# Patient Record
Sex: Female | Born: 1945
Health system: Southern US, Community
[De-identification: ages and names within clinical notes are randomized; demographics above are authoritative.]

## PROBLEM LIST (undated history)

## (undated) DIAGNOSIS — G8929 Other chronic pain: Secondary | ICD-10-CM

## (undated) DIAGNOSIS — E785 Hyperlipidemia, unspecified: Secondary | ICD-10-CM

## (undated) DIAGNOSIS — M1990 Unspecified osteoarthritis, unspecified site: Secondary | ICD-10-CM

## (undated) DIAGNOSIS — Z87820 Personal history of traumatic brain injury: Secondary | ICD-10-CM

## (undated) DIAGNOSIS — E119 Type 2 diabetes mellitus without complications: Secondary | ICD-10-CM

## (undated) DIAGNOSIS — C801 Malignant (primary) neoplasm, unspecified: Secondary | ICD-10-CM

## (undated) DIAGNOSIS — R32 Unspecified urinary incontinence: Secondary | ICD-10-CM

## (undated) DIAGNOSIS — M4800 Spinal stenosis, site unspecified: Secondary | ICD-10-CM

## (undated) DIAGNOSIS — Z923 Personal history of irradiation: Secondary | ICD-10-CM

## (undated) HISTORY — DX: Malignant (primary) neoplasm, unspecified: C80.1

## (undated) HISTORY — PX: ABDOMINAL HYSTERECTOMY: SHX81

## (undated) HISTORY — PX: TONSILLECTOMY: SUR1361

## (undated) HISTORY — PX: COLONOSCOPY: SHX174

---

## 1997-11-06 HISTORY — PX: BREAST LUMPECTOMY: SHX2

## 2006-11-09 ENCOUNTER — Ambulatory Visit: Payer: Self-pay | Admitting: Gastroenterology

## 2010-11-06 DIAGNOSIS — Z87820 Personal history of traumatic brain injury: Secondary | ICD-10-CM

## 2010-11-06 HISTORY — DX: Personal history of traumatic brain injury: Z87.820

## 2010-11-06 HISTORY — PX: SHOULDER ARTHROSCOPY: SHX128

## 2010-12-12 ENCOUNTER — Ambulatory Visit: Payer: Self-pay | Admitting: Internal Medicine

## 2012-08-23 ENCOUNTER — Other Ambulatory Visit: Payer: Self-pay | Admitting: Family Medicine

## 2012-08-23 DIAGNOSIS — S0990XA Unspecified injury of head, initial encounter: Secondary | ICD-10-CM

## 2012-08-26 ENCOUNTER — Other Ambulatory Visit: Payer: Self-pay

## 2014-05-07 DIAGNOSIS — E785 Hyperlipidemia, unspecified: Secondary | ICD-10-CM | POA: Insufficient documentation

## 2014-05-07 DIAGNOSIS — E559 Vitamin D deficiency, unspecified: Secondary | ICD-10-CM | POA: Insufficient documentation

## 2014-05-07 DIAGNOSIS — E1169 Type 2 diabetes mellitus with other specified complication: Secondary | ICD-10-CM | POA: Insufficient documentation

## 2014-11-06 HISTORY — PX: BREAST LUMPECTOMY: SHX2

## 2015-01-15 ENCOUNTER — Ambulatory Visit: Payer: Self-pay | Admitting: Physician Assistant

## 2015-01-18 ENCOUNTER — Ambulatory Visit: Payer: Self-pay | Admitting: Physician Assistant

## 2015-01-21 ENCOUNTER — Ambulatory Visit
Admit: 2015-01-21 | Disposition: A | Discharge status: Home / Self Care | Payer: Self-pay | Attending: Oncology | Admitting: Oncology

## 2015-01-25 ENCOUNTER — Telehealth: Payer: Self-pay | Admitting: *Deleted

## 2015-01-25 NOTE — Telephone Encounter (Signed)
Received appt from Dr. Jana Hakim.  Called pt and confirmed 02/01/15 FA and lab appt and 4/1 med onc appt w/ pt.  Mailed before appt letter, calendar, welcoming packet & intake form to pt.   Placed paperwork in Dr. Virgie Dad box and took a copy to HIM to scan.

## 2015-01-26 ENCOUNTER — Ambulatory Visit
Admit: 2015-01-26 | Disposition: A | Discharge status: Home / Self Care | Payer: Self-pay | Attending: Oncology | Admitting: Oncology

## 2015-01-28 ENCOUNTER — Other Ambulatory Visit (INDEPENDENT_AMBULATORY_CARE_PROVIDER_SITE_OTHER): Payer: Self-pay

## 2015-01-28 ENCOUNTER — Other Ambulatory Visit: Payer: Self-pay | Admitting: General Surgery

## 2015-01-28 ENCOUNTER — Other Ambulatory Visit: Payer: Self-pay | Admitting: *Deleted

## 2015-01-28 DIAGNOSIS — C50919 Malignant neoplasm of unspecified site of unspecified female breast: Secondary | ICD-10-CM

## 2015-01-28 DIAGNOSIS — C50912 Malignant neoplasm of unspecified site of left female breast: Secondary | ICD-10-CM | POA: Insufficient documentation

## 2015-02-01 ENCOUNTER — Encounter (HOSPITAL_COMMUNITY): Payer: Self-pay

## 2015-02-01 ENCOUNTER — Ambulatory Visit: Payer: Medicare Other

## 2015-02-01 ENCOUNTER — Encounter: Payer: Self-pay | Admitting: Oncology

## 2015-02-01 ENCOUNTER — Other Ambulatory Visit: Payer: Self-pay | Admitting: General Surgery

## 2015-02-01 ENCOUNTER — Other Ambulatory Visit: Payer: Self-pay | Admitting: *Deleted

## 2015-02-01 ENCOUNTER — Other Ambulatory Visit (HOSPITAL_BASED_OUTPATIENT_CLINIC_OR_DEPARTMENT_OTHER): Payer: Medicare Other

## 2015-02-01 DIAGNOSIS — C50919 Malignant neoplasm of unspecified site of unspecified female breast: Secondary | ICD-10-CM

## 2015-02-01 DIAGNOSIS — C50812 Malignant neoplasm of overlapping sites of left female breast: Secondary | ICD-10-CM

## 2015-02-01 DIAGNOSIS — C50912 Malignant neoplasm of unspecified site of left female breast: Secondary | ICD-10-CM

## 2015-02-01 LAB — COMPREHENSIVE METABOLIC PANEL (CC13)
ALK PHOS: 27 U/L — AB (ref 40–150)
ALT: 29 U/L (ref 0–55)
AST: 24 U/L (ref 5–34)
Albumin: 3.8 g/dL (ref 3.5–5.0)
Anion Gap: 11 mEq/L (ref 3–11)
BUN: 18.7 mg/dL (ref 7.0–26.0)
CALCIUM: 8.9 mg/dL (ref 8.4–10.4)
CO2: 27 mEq/L (ref 22–29)
CREATININE: 0.9 mg/dL (ref 0.6–1.1)
Chloride: 105 mEq/L (ref 98–109)
EGFR: 70 mL/min/{1.73_m2} — AB (ref >90)
Glucose: 119 mg/dl (ref 70–140)
Potassium: 4.1 mEq/L (ref 3.5–5.1)
Sodium: 142 mEq/L (ref 136–145)
Total Bilirubin: 0.5 mg/dL (ref 0.20–1.20)
Total Protein: 6.5 g/dL (ref 6.4–8.3)

## 2015-02-01 LAB — CBC WITH DIFFERENTIAL/PLATELET
BASO%: 0.6 % (ref 0.0–2.0)
Basophils Absolute: 0 10*3/uL (ref 0.0–0.1)
EOS ABS: 0.1 10*3/uL (ref 0.0–0.5)
EOS%: 2 % (ref 0.0–7.0)
HCT: 40.3 % (ref 34.8–46.6)
HGB: 13.3 g/dL (ref 11.6–15.9)
LYMPH%: 22.6 % (ref 14.0–49.7)
MCH: 29.2 pg (ref 25.1–34.0)
MCHC: 33 g/dL (ref 31.5–36.0)
MCV: 88.6 fL (ref 79.5–101.0)
MONO#: 0.4 10*3/uL (ref 0.1–0.9)
MONO%: 7.6 % (ref 0.0–14.0)
NEUT%: 67.2 % (ref 38.4–76.8)
NEUTROS ABS: 3.4 10*3/uL (ref 1.5–6.5)
PLATELETS: 186 10*3/uL (ref 145–400)
RBC: 4.55 10*6/uL (ref 3.70–5.45)
RDW: 13 % (ref 11.2–14.5)
WBC: 5 10*3/uL (ref 3.9–10.3)
lymph#: 1.1 10*3/uL (ref 0.9–3.3)

## 2015-02-01 NOTE — Progress Notes (Signed)
Checked in new pt with no financial concerns at this time.  Pt has my card for any billing questions or concerns. °

## 2015-02-02 ENCOUNTER — Other Ambulatory Visit: Payer: Medicare Other

## 2015-02-02 ENCOUNTER — Ambulatory Visit
Admission: RE | Admit: 2015-02-02 | Discharge: 2015-02-02 | Disposition: A | Discharge status: Home / Self Care | Payer: Medicare Other | Source: Ambulatory Visit | Attending: General Surgery | Admitting: General Surgery

## 2015-02-02 ENCOUNTER — Other Ambulatory Visit: Payer: Self-pay | Admitting: Oncology

## 2015-02-02 DIAGNOSIS — C50912 Malignant neoplasm of unspecified site of left female breast: Secondary | ICD-10-CM

## 2015-02-03 ENCOUNTER — Ambulatory Visit
Admission: RE | Admit: 2015-02-03 | Discharge: 2015-02-03 | Disposition: A | Discharge status: Home / Self Care | Payer: Medicare Other | Source: Ambulatory Visit | Attending: General Surgery | Admitting: General Surgery

## 2015-02-03 MED ORDER — GADOBENATE DIMEGLUMINE 529 MG/ML IV SOLN
17.0000 mL | Freq: Once | INTRAVENOUS | Status: AC | PRN
  Administered 2015-02-03: 17 mL via INTRAVENOUS

## 2015-02-04 ENCOUNTER — Other Ambulatory Visit: Payer: Self-pay | Admitting: General Surgery

## 2015-02-04 ENCOUNTER — Ambulatory Visit
Admission: RE | Admit: 2015-02-04 | Discharge: 2015-02-04 | Disposition: A | Discharge status: Home / Self Care | Payer: Medicare Other | Source: Ambulatory Visit | Attending: General Surgery | Admitting: General Surgery

## 2015-02-04 ENCOUNTER — Encounter (HOSPITAL_BASED_OUTPATIENT_CLINIC_OR_DEPARTMENT_OTHER): Payer: Self-pay | Admitting: *Deleted

## 2015-02-04 DIAGNOSIS — C50919 Malignant neoplasm of unspecified site of unspecified female breast: Secondary | ICD-10-CM

## 2015-02-04 NOTE — Progress Notes (Signed)
[place]  Telephone:([phone]) [phone] Fax:(336) [phone]     ID: Gabriela [name] DOB: Jul 30, 1946  MR#: [id number]  UKG#:[id number]  Patient Care Team: Glendon Axe, MD as PCP - General (Internal Medicine) Excell Seltzer, MD as Consulting Physician (General Surgery) PCP: Glendon Axe, MD GYN: SU: Excell Seltzer M.D. OTHER MD: Prentiss Bells "Gabriela" [name], [place]; Berton Mount MD  CHIEF COMPLAINT: Estrogen receptor positive breast cancer  CURRENT TREATMENT: Preoperative evaluation   BREAST CANCER HISTORY: The patient herself noted a lump in her left breast, associated with some discomfort. She brought it to her physician's attention and on 01/15/2015 underwent left digital mammography with tomography and ultrasonography at [facility] regional. This showed breast density to be category A. Compression images were negative but on physical exam there was a firm palpable mobile nodule at the 3:00 position of the left breast. By ultrasound this measured 9 mm maximally. There is no note regarding axillary adenopathy in the mammographic report.  On 01/18/2015 the patient underwent biopsy of the mass in question and this showed ([id number]; [id number]) an invasive mammary carcinoma, measuring 3 mm on the specimen, grade 1, estrogen receptor greater than 90% positive, progesterone receptor between 11 and 50% positive, with no HER-2 amplification  (Immunohistochemistry score 1+).  On 02/03/2015 the patient underwent bilateral breast MRI. This suggested a breast density category B. The right breast was negative and there were no abnormal appearing lymph nodes. In the left breast there was an irregular enhancing mass subareolar early measuring 9 mm.  Her subsequent history is as detailed below  INTERVAL HISTORY: Please was evaluated in the breast clinic for 11/25/2014 accompanied by her husband Gabriela [name] and her daughter's Gabriela [name] and Gabriela [name]. Her case was also presented at the  multi-disciplinary breast cancer conference 02/03/2015. At that time a preliminary plan suggested breast conserving surgery, with nipple sparing if possible, Oncotype DX to clinch the chemotherapy decision,, radiation, and anti-estrogens following radiation.  REVIEW OF SYSTEMS: Aside from the palpable mass and some intermittent left breast discomfort,, there were no specific symptoms leading to the original mammogram, which was routinely scheduled. The patient denies unusual headaches, visual changes, nausea, vomiting, stiff neck, dizziness, or gait imbalance. There has been no cough, phlegm production, or pleurisy, no chest pain or pressure, and no change in bowel or bladder habits. The patient denies fever, rash, bleeding, unexplained fatigue or unexplained weight loss. Gabriela [name] is having some seasonal sinus symptoms. A detailed review of systems today was otherwise entirely negative.   PAST MEDICAL HISTORY: Past Medical History  Diagnosis Date  . Hyperlipemia   . Diabetes mellitus without complication     diet controlled  . Arthritis   . History of closed head injury 2012    accident work threw her 23f  . Chronic pain     from fall 2012  . Urinary incontinence     wears a pessery    PAST SURGICAL HISTORY: Past Surgical History  Procedure Laterality Date  . Shoulder arthroscopy  2012    left  . Tonsillectomy    . Breast lumpectomy  1999    left  . Colonoscopy     Status post tonsillectomy and left shoulder surgery   FAMILY HISTORY No family history on file. The patient's father died in an automobile accident at age 68 The patient's mother died at the age of 375from colon cancer. The patient has been told that her mother was on a medication during her pregnancy that may have caused the colon cancer.  The patient's mother had a sister diagnosed with breast and cervical cancer in her 62s. She also had a brother who was killed in World War II 1. There is no other history of cancer in  the family to the patient's knowledge   GYNECOLOGIC HISTORY:  No LMP recorded. Patient is postmenopausal. Menarche age 54, first live birth age 47. The patient is GX P3. She went through the change of life approximately age 43. She did not take hormone replacement. She never used birth control   SOCIAL HISTORY:  The patient and her husband Gabriela [name] are both retired. They live on 83 acres in our quite busy caring for that. At is also [name] chief source of exercise. [name] is an occupational Programmer, applications in [place]. [name] is a Cabin crew in [place]. [name] works as a Librarian, academic for the twin [employer] in [place]. The patient has 4 grandchildren. She is a Psychologist, forensic.    ADVANCED DIRECTIVES: In place   HEALTH MAINTENANCE: History  Substance Use Topics  . Smoking status: Never Smoker   . Smokeless tobacco: Not on file  . Alcohol Use: No     Colonoscopy:2008/current nodal clinic  PAP:2015  Bone density:2007  Lipid panel:  Allergies  Allergen Reactions  . Sulfa Antibiotics Itching    Current Outpatient Prescriptions  Medication Sig Dispense Refill  . ALPHA LIPOIC ACID PO Take by mouth.    . cholecalciferol (VITAMIN D) 1000 UNITS tablet Take 1,000 Units by mouth daily.    . montelukast (SINGULAIR) 10 MG tablet Take 10 mg by mouth at bedtime.     No current facility-administered medications for this visit.    OBJECTIVE: Middle-aged white woman in no acute distress Filed Vitals:   02/05/15 0905  BP: 123/63  Pulse: 63  Temp: 97.6 F (36.4 C)  Resp: 18     Body mass index is 27.62 kg/(m^2).    ECOG FS:0 - Asymptomatic  Ocular: Sclerae unicteric, pupils round and equal Ear-nose-throat: Oropharynx clear and moist Lymphatic: No cervical or supraclavicular adenopathy Lungs no rales or rhonchi, good excursion bilaterally Heart regular rate and rhythm, no murmur appreciated Abd soft, nontender, positive bowel sounds MSK no focal  spinal tenderness, no joint edema Neuro: non-focal, well-oriented, appropriate affect Breasts: The right breast is unremarkable. In the left breast there is a palpable movable nontender mass in the lateral aspect, which is hard to delineate but measures approximately 2 cm. There is no skin or nipple change associated with this. The left axilla is benign.   LAB RESULTS:  CMP     Component Value Date/Time   NA 142 02/01/2015 0832   K 4.1 02/01/2015 0832   CO2 27 02/01/2015 0832   GLUCOSE 119 02/01/2015 0832   BUN 18.7 02/01/2015 0832   CREATININE 0.9 02/01/2015 0832   CALCIUM 8.9 02/01/2015 0832   PROT 6.5 02/01/2015 0832   ALBUMIN 3.8 02/01/2015 0832   AST 24 02/01/2015 0832   ALT 29 02/01/2015 0832   ALKPHOS 27* 02/01/2015 0832   BILITOT 0.50 02/01/2015 0832    INo results found for: SPEP, UPEP  Lab Results  Component Value Date   WBC 5.0 02/01/2015   NEUTROABS 3.4 02/01/2015   HGB 13.3 02/01/2015   HCT 40.3 02/01/2015   MCV 88.6 02/01/2015   PLT 186 02/01/2015      Chemistry      Component Value Date/Time   NA 142 02/01/2015 0832   K 4.1 02/01/2015 5701  CO2 27 02/01/2015 0832   BUN 18.7 02/01/2015 0832   CREATININE 0.9 02/01/2015 0832      Component Value Date/Time   CALCIUM 8.9 02/01/2015 0832   ALKPHOS 27* 02/01/2015 0832   AST 24 02/01/2015 0832   ALT 29 02/01/2015 0832   BILITOT 0.50 02/01/2015 0832       No results found for: LABCA2  No components found for: LABCA125  No results for input(s): INR in the last 168 hours.  Urinalysis No results found for: COLORURINE, APPEARANCEUR, LABSPEC, PHURINE, GLUCOSEU, HGBUR, BILIRUBINUR, KETONESUR, PROTEINUR, UROBILINOGEN, NITRITE, LEUKOCYTESUR  STUDIES: Mr Breast Bilateral W Wo Contrast  02/03/2015   CLINICAL DATA:  69 year old female with recently diagnosed left breast carcinoma  LABS:  No labs were performed at the imaging center today.  EXAM: BILATERAL BREAST MRI WITH AND WITHOUT CONTRAST  TECHNIQUE:  Multiplanar, multisequence MR images of both breasts were obtained prior to and following the intravenous administration of 17 ml of MultiHance.  THREE-DIMENSIONAL MR IMAGE RENDERING ON INDEPENDENT WORKSTATION:  Three-dimensional MR images were rendered by post-processing of the original MR data on an independent workstation. The three-dimensional MR images were interpreted, and findings are reported in the following complete MRI report for this study. Three dimensional images were evaluated at the independent DynaCad workstation  COMPARISON:  Mammograms, the most recent dated 02/02/2015. Left breast ultrasound dated 01/18/2015.  FINDINGS: Breast composition: b. Scattered fibroglandular tissue.  Background parenchymal enhancement: Moderate.  Right breast: No suspicious areas of enhancement.  Left breast: An irregular enhancing mass is seen in the left subareolar, 330 location measuring 9 x 5 x 6 mm, corresponding to the known biopsy proven malignancy. Clip from recent biopsy is noted adjacent to the inferior aspect of the mass, series 2, image 51.  Lymph nodes: No abnormal appearing lymph nodes.  Ancillary findings:  None.  IMPRESSION: Known biopsy proven malignancy within the left breast.  RECOMMENDATION: Treatment plan.  BI-RADS CATEGORY  6: Known biopsy-proven malignancy.   Electronically Signed   By: Pamelia Hoit M.D.   On: 02/03/2015 14:39   Mm Diag Breast Tomo Uni Right  02/02/2015   CLINICAL DATA:  Recently diagnosed left breast cancer. Pre MRI evaluation of the right breast.  EXAM: DIGITAL DIAGNOSTIC RIGHT MAMMOGRAM WITH 3D TOMOSYNTHESIS AND CAD  COMPARISON:  Previous examinations at [facility] and at [facility].  ACR Breast Density Category b: There are scattered areas of fibroglandular density.  FINDINGS: Stable mammographic appearance of the right breast with no findings suspicious for malignancy.  Mammographic images were processed with CAD.  IMPRESSION: No evidence of  malignancy.  RECOMMENDATION: Treatment plan for the patient's known left breast cancer.  I have discussed the findings and recommendations with the patient. Results were also provided in writing at the conclusion of the visit. If applicable, a reminder letter will be sent to the patient regarding the next appointment.  BI-RADS CATEGORY  1: Negative.   Electronically Signed   By: Claudie Revering M.D.   On: 02/02/2015 11:12   Mm Lt Radioactive Seed Loc Mammo Guide  02/04/2015   CLINICAL DATA:  Patient presents for radioactive seed localization of a small left breast carcinoma, previously biopsied.  EXAM: MAMMOGRAPHIC GUIDED RADIOACTIVE SEED LOCALIZATION OF THE LEFT BREAST  COMPARISON:  Previous exam(s).  FINDINGS: Patient presents for radioactive seed localization prior to surgical excision of a small retroareolar left breast carcinoma. I met with the patient and we discussed the procedure of seed localization including benefits and  alternatives. We discussed the high likelihood of a successful procedure. We discussed the risks of the procedure including infection, bleeding, tissue injury and further surgery. We discussed the low dose of radioactivity involved in the procedure. Informed, written consent was given.  The usual time-out protocol was performed immediately prior to the procedure.  Using mammographic guidance, sterile technique, 2% lidocaine and an I-125 radioactive seed, the coil shaped biopsy clip was localized using a cranial to caudal approach. The follow-up mammogram images confirm the seed in the expected location and were marked for Dr. Excell Seltzer.  Follow-up survey of the patient confirms presence of the radioactive seed.  Order number of I-125 seed:  [id number].  Total activity:  0.262 mCi  Reference Date: 01/29/2015  The patient tolerated the procedure well and was released from the [facility]. She was given instructions regarding seed removal.  IMPRESSION: Radioactive seed localization left  breast. No apparent complications.   Electronically Signed   By: Lajean Manes M.D.   On: 02/04/2015 15:14    ASSESSMENT: 69 y.o. [place] woman status post left breast biopsy 01/18/2015 for a clinical T1b N0, stage IA invasive ductal carcinoma, grade 1, estrogen and progesterone receptor positive, HER-2 not amplified  (1) breast conserving surgery planned for 02/08/2015  (2) Oncotype DX to be sent from the final pathology report  (3) the patient will benefit from adjuvant radiation  (4) adjuvant antiestrogen therapy to follow radiation.  PLAN: We spent the better part of today's hour-long appointment discussing the biology of breast cancer in general, and the specifics of the patient's tumor in particular. Gabriela [name] understands she has a small, nonaggressive looking breast cancer, most likely ductal, but possibly lobular. Clinically it is stage I. We discussed the difference between local and systemic treatment, and she understands she will need surgery and radiation for local therapy, and that this is equivalent to mastectomy in terms of survival. We emphasize that even if she had both her breasts removed that would not be any better than a lumpectomy plus radiation.  We then discussed systemic therapy. She will clearly benefit from anti-estrogens, which are the most effective single treatment to prevent breast cancer recurrence. She will not benefit from anti-HER-2 treatment, since her breast cancer is HER-2 not amplified.  We then discussed chemotherapy. I think she will benefit from an Oncotype and this will be requested from the definitive surgical specimen which will be obtained 02/08/2015. We should have results within 2 weeks and I will call her with those results. My expectation is that she will not need chemotherapy and that she can go straight on to radiation. For that reason I have made her a return appointment with me for late May.  The patient was reluctant to, here for radiation.  She would like to be treated under Dr. Donella Stade in [place]. We went ahead and requested an appointment with him for the second week in April.  It is not clear to me whether the patient warrants genetic testing given her mother's early history of colon cancer. The patient thinks this may have been due to some drug her mother received during pregnancy. I will check with the genetics counselor however regarding this.  Gabriela [name] has a good understanding of the overall plan. She agrees with it. She knows the goal of treatment in her case is cure. She will call with any problems that may develop before her next visit here.  Chauncey Cruel, MD   02/06/2015 1:18 PM Medical Oncology and Hematology [facility]  [place], [place] [address] Tel. [phone]    Fax. [phone]

## 2015-02-04 NOTE — Progress Notes (Signed)
No labs needed

## 2015-02-05 ENCOUNTER — Telehealth: Payer: Self-pay | Admitting: Oncology

## 2015-02-05 ENCOUNTER — Ambulatory Visit
Admit: 2015-02-05 | Disposition: A | Discharge status: Home / Self Care | Payer: Self-pay | Attending: Oncology | Admitting: Oncology

## 2015-02-05 ENCOUNTER — Ambulatory Visit (HOSPITAL_BASED_OUTPATIENT_CLINIC_OR_DEPARTMENT_OTHER): Payer: Medicare Other | Admitting: Oncology

## 2015-02-05 VITALS — BP 123/63 | HR 63 | Temp 97.6°F | Resp 18 | Ht 68.0 in | Wt 181.6 lb

## 2015-02-05 DIAGNOSIS — E119 Type 2 diabetes mellitus without complications: Secondary | ICD-10-CM

## 2015-02-05 DIAGNOSIS — Z800 Family history of malignant neoplasm of digestive organs: Secondary | ICD-10-CM

## 2015-02-05 DIAGNOSIS — C50812 Malignant neoplasm of overlapping sites of left female breast: Secondary | ICD-10-CM

## 2015-02-05 DIAGNOSIS — C50912 Malignant neoplasm of unspecified site of left female breast: Secondary | ICD-10-CM

## 2015-02-05 DIAGNOSIS — Z170 Estrogen receptor positive status [ER+]: Secondary | ICD-10-CM | POA: Diagnosis not present

## 2015-02-05 NOTE — Telephone Encounter (Signed)
Opened in error

## 2015-02-05 NOTE — Telephone Encounter (Signed)
Pt appt to see Dr. Donella Stade @ [place] is 02/24/15@10 :00.  Medical records faxed

## 2015-02-05 NOTE — Telephone Encounter (Signed)
Gave patient avs report and appointment for 03/22/15. Appointment with Dr. Pablo Ledger requested/ordered by Dr. Excell Seltzer - patient will call radonc to cancel this appointment. Spoke Kim H in HIM re referral to Dr. Donella Stade at [facility] for [place]. Patient aware Blima Singer will contact her with this appointment.

## 2015-02-06 DIAGNOSIS — E119 Type 2 diabetes mellitus without complications: Secondary | ICD-10-CM | POA: Insufficient documentation

## 2015-02-08 ENCOUNTER — Ambulatory Visit
Admission: RE | Admit: 2015-02-08 | Discharge: 2015-02-08 | Disposition: A | Discharge status: Home / Self Care | Payer: Medicare Other | Source: Ambulatory Visit | Attending: General Surgery | Admitting: General Surgery

## 2015-02-08 ENCOUNTER — Ambulatory Visit (HOSPITAL_BASED_OUTPATIENT_CLINIC_OR_DEPARTMENT_OTHER)
Admission: RE | Admit: 2015-02-08 | Discharge: 2015-02-08 | Disposition: A | Discharge status: Home / Self Care | Payer: Medicare Other | Source: Ambulatory Visit | Attending: General Surgery | Admitting: General Surgery

## 2015-02-08 ENCOUNTER — Encounter (HOSPITAL_BASED_OUTPATIENT_CLINIC_OR_DEPARTMENT_OTHER)
Admission: RE | Disposition: A | Discharge status: Home / Self Care | Payer: Self-pay | Source: Ambulatory Visit | Attending: General Surgery

## 2015-02-08 ENCOUNTER — Ambulatory Visit (HOSPITAL_COMMUNITY)
Admission: RE | Admit: 2015-02-08 | Discharge: 2015-02-08 | Disposition: A | Discharge status: Home / Self Care | Payer: Medicare Other | Source: Ambulatory Visit | Attending: General Surgery | Admitting: General Surgery

## 2015-02-08 ENCOUNTER — Ambulatory Visit (HOSPITAL_BASED_OUTPATIENT_CLINIC_OR_DEPARTMENT_OTHER): Payer: Medicare Other | Admitting: Anesthesiology

## 2015-02-08 ENCOUNTER — Encounter (HOSPITAL_BASED_OUTPATIENT_CLINIC_OR_DEPARTMENT_OTHER): Payer: Self-pay | Admitting: *Deleted

## 2015-02-08 DIAGNOSIS — C50919 Malignant neoplasm of unspecified site of unspecified female breast: Secondary | ICD-10-CM

## 2015-02-08 DIAGNOSIS — C50912 Malignant neoplasm of unspecified site of left female breast: Secondary | ICD-10-CM

## 2015-02-08 DIAGNOSIS — Z170 Estrogen receptor positive status [ER+]: Secondary | ICD-10-CM | POA: Insufficient documentation

## 2015-02-08 DIAGNOSIS — Z803 Family history of malignant neoplasm of breast: Secondary | ICD-10-CM | POA: Insufficient documentation

## 2015-02-08 DIAGNOSIS — E119 Type 2 diabetes mellitus without complications: Secondary | ICD-10-CM | POA: Insufficient documentation

## 2015-02-08 DIAGNOSIS — M1990 Unspecified osteoarthritis, unspecified site: Secondary | ICD-10-CM | POA: Insufficient documentation

## 2015-02-08 DIAGNOSIS — C50512 Malignant neoplasm of lower-outer quadrant of left female breast: Secondary | ICD-10-CM | POA: Insufficient documentation

## 2015-02-08 HISTORY — DX: Other chronic pain: G89.29

## 2015-02-08 HISTORY — DX: Type 2 diabetes mellitus without complications: E11.9

## 2015-02-08 HISTORY — DX: Personal history of traumatic brain injury: Z87.820

## 2015-02-08 HISTORY — DX: Unspecified osteoarthritis, unspecified site: M19.90

## 2015-02-08 HISTORY — DX: Unspecified urinary incontinence: R32

## 2015-02-08 HISTORY — DX: Hyperlipidemia, unspecified: E78.5

## 2015-02-08 HISTORY — PX: RADIOACTIVE SEED GUIDED PARTIAL MASTECTOMY WITH AXILLARY SENTINEL LYMPH NODE BIOPSY: SHX6520

## 2015-02-08 SURGERY — RADIOACTIVE SEED GUIDED PARTIAL MASTECTOMY WITH AXILLARY SENTINEL LYMPH NODE BIOPSY
Anesthesia: Regional | Site: Breast | Laterality: Left

## 2015-02-08 MED ORDER — MIDAZOLAM HCL 2 MG/2ML IJ SOLN
INTRAMUSCULAR | Status: AC
  Filled 2015-02-08: qty 2

## 2015-02-08 MED ORDER — METHYLENE BLUE 1 % INJ SOLN
INTRAMUSCULAR | Status: AC
  Filled 2015-02-08: qty 10

## 2015-02-08 MED ORDER — CEFAZOLIN SODIUM-DEXTROSE 2-3 GM-% IV SOLR
INTRAVENOUS | Status: AC
  Filled 2015-02-08: qty 50

## 2015-02-08 MED ORDER — HYDROMORPHONE HCL 1 MG/ML IJ SOLN
0.2500 mg | INTRAMUSCULAR | Status: DC | PRN
  Administered 2015-02-08 (×3): 0.5 mg via INTRAVENOUS

## 2015-02-08 MED ORDER — FENTANYL CITRATE 0.05 MG/ML IJ SOLN
100.0000 ug | Freq: Once | INTRAMUSCULAR | Status: AC
  Administered 2015-02-08: 50 ug via INTRAVENOUS

## 2015-02-08 MED ORDER — MIDAZOLAM HCL 2 MG/2ML IJ SOLN
1.0000 mg | INTRAMUSCULAR | Status: DC | PRN
  Administered 2015-02-08 (×2): 1 mg via INTRAVENOUS

## 2015-02-08 MED ORDER — LACTATED RINGERS IV SOLN
INTRAVENOUS | Status: DC
  Administered 2015-02-08 (×3): via INTRAVENOUS

## 2015-02-08 MED ORDER — FENTANYL CITRATE 0.05 MG/ML IJ SOLN
INTRAMUSCULAR | Status: AC
  Filled 2015-02-08: qty 2

## 2015-02-08 MED ORDER — BUPIVACAINE-EPINEPHRINE (PF) 0.5% -1:200000 IJ SOLN
INTRAMUSCULAR | Status: DC | PRN
  Administered 2015-02-08: 30 mL

## 2015-02-08 MED ORDER — HYDROMORPHONE HCL 1 MG/ML IJ SOLN
INTRAMUSCULAR | Status: AC
  Filled 2015-02-08: qty 1

## 2015-02-08 MED ORDER — PROPOFOL 10 MG/ML IV EMUL
INTRAVENOUS | Status: AC
  Filled 2015-02-08: qty 50

## 2015-02-08 MED ORDER — DEXAMETHASONE SODIUM PHOSPHATE 4 MG/ML IJ SOLN
INTRAMUSCULAR | Status: DC | PRN
  Administered 2015-02-08: 8 mg via INTRAVENOUS

## 2015-02-08 MED ORDER — BUPIVACAINE-EPINEPHRINE (PF) 0.25% -1:200000 IJ SOLN
INTRAMUSCULAR | Status: DC | PRN
  Administered 2015-02-08: 15 mL via PERINEURAL

## 2015-02-08 MED ORDER — FENTANYL CITRATE 0.05 MG/ML IJ SOLN
INTRAMUSCULAR | Status: DC | PRN
  Administered 2015-02-08 (×4): 25 ug via INTRAVENOUS

## 2015-02-08 MED ORDER — FENTANYL CITRATE 0.05 MG/ML IJ SOLN
50.0000 ug | INTRAMUSCULAR | Status: DC | PRN
  Administered 2015-02-08 (×3): 50 ug via INTRAVENOUS

## 2015-02-08 MED ORDER — ONDANSETRON HCL 4 MG/2ML IJ SOLN
INTRAMUSCULAR | Status: DC | PRN
  Administered 2015-02-08: 4 mg via INTRAVENOUS

## 2015-02-08 MED ORDER — PROPOFOL 10 MG/ML IV BOLUS
INTRAVENOUS | Status: AC
  Filled 2015-02-08: qty 40

## 2015-02-08 MED ORDER — BUPIVACAINE-EPINEPHRINE (PF) 0.25% -1:200000 IJ SOLN
INTRAMUSCULAR | Status: AC
  Filled 2015-02-08: qty 30

## 2015-02-08 MED ORDER — CEFAZOLIN SODIUM-DEXTROSE 2-3 GM-% IV SOLR
2.0000 g | INTRAVENOUS | Status: AC
  Administered 2015-02-08: 2 g via INTRAVENOUS

## 2015-02-08 MED ORDER — FENTANYL CITRATE 0.05 MG/ML IJ SOLN
INTRAMUSCULAR | Status: AC
  Filled 2015-02-08: qty 6

## 2015-02-08 MED ORDER — HYDROCODONE-ACETAMINOPHEN 5-325 MG PO TABS: 1.0000 | ORAL_TABLET | ORAL | Status: DC | PRN

## 2015-02-08 MED ORDER — LIDOCAINE HCL (CARDIAC) 20 MG/ML IV SOLN
INTRAVENOUS | Status: DC | PRN
  Administered 2015-02-08: 50 mg via INTRAVENOUS

## 2015-02-08 MED ORDER — SODIUM CHLORIDE 0.9 % IJ SOLN
INTRAMUSCULAR | Status: DC | PRN
  Administered 2015-02-08: 5 mL via INTRAMUSCULAR

## 2015-02-08 MED ORDER — CHLORHEXIDINE GLUCONATE 4 % EX LIQD: 1.0000 "application " | Freq: Once | CUTANEOUS | Status: DC

## 2015-02-08 MED ORDER — SODIUM CHLORIDE 0.9 % IJ SOLN
INTRAMUSCULAR | Status: AC
  Filled 2015-02-08: qty 10

## 2015-02-08 MED ORDER — TECHNETIUM TC 99M SULFUR COLLOID FILTERED: 1.0000 | Freq: Once | INTRAVENOUS | Status: AC | PRN

## 2015-02-08 MED ORDER — PROPOFOL 10 MG/ML IV BOLUS
INTRAVENOUS | Status: DC | PRN
Start: 2015-02-08 — End: 2015-02-08
  Administered 2015-02-08: 150 mg via INTRAVENOUS

## 2015-02-08 SURGICAL SUPPLY — 52 items
APPLIER CLIP 9.375 MED OPEN (MISCELLANEOUS) ×2
APR CLP MED 9.3 20 MLT OPN (MISCELLANEOUS) ×1
BINDER BREAST LRG (GAUZE/BANDAGES/DRESSINGS) IMPLANT
BINDER BREAST MEDIUM (GAUZE/BANDAGES/DRESSINGS) IMPLANT
BINDER BREAST XLRG (GAUZE/BANDAGES/DRESSINGS) ×1 IMPLANT
BINDER BREAST XXLRG (GAUZE/BANDAGES/DRESSINGS) IMPLANT
BLADE SURG 15 STRL LF DISP TIS (BLADE) ×1 IMPLANT
BLADE SURG 15 STRL SS (BLADE) ×2
CANISTER SUC SOCK COL 7IN (MISCELLANEOUS) IMPLANT
CANISTER SUCT 1200ML W/VALVE (MISCELLANEOUS) IMPLANT
CHLORAPREP W/TINT 26ML (MISCELLANEOUS) ×2 IMPLANT
CLIP APPLIE 9.375 MED OPEN (MISCELLANEOUS) ×1 IMPLANT
CLIP TI WIDE RED SMALL 6 (CLIP) IMPLANT
COVER BACK TABLE 60X90IN (DRAPES) ×2 IMPLANT
COVER MAYO STAND STRL (DRAPES) ×2 IMPLANT
COVER PROBE W GEL 5X96 (DRAPES) ×2 IMPLANT
DECANTER SPIKE VIAL GLASS SM (MISCELLANEOUS) IMPLANT
DEVICE DUBIN W/COMP PLATE 8390 (MISCELLANEOUS) ×2 IMPLANT
DRAPE LAPAROSCOPIC ABDOMINAL (DRAPES) ×1 IMPLANT
DRAPE UTILITY XL STRL (DRAPES) ×2 IMPLANT
ELECT COATED BLADE 2.86 ST (ELECTRODE) ×2 IMPLANT
ELECT REM PT RETURN 9FT ADLT (ELECTROSURGICAL) ×2
ELECTRODE REM PT RTRN 9FT ADLT (ELECTROSURGICAL) ×1 IMPLANT
GLOVE BIOGEL M 7.0 STRL (GLOVE) ×2 IMPLANT
GLOVE BIOGEL PI IND STRL 7.5 (GLOVE) IMPLANT
GLOVE BIOGEL PI IND STRL 8 (GLOVE) ×1 IMPLANT
GLOVE BIOGEL PI INDICATOR 7.5 (GLOVE) ×1
GLOVE BIOGEL PI INDICATOR 8 (GLOVE) ×1
GLOVE ECLIPSE 7.5 STRL STRAW (GLOVE) ×3 IMPLANT
GLOVE EXAM NITRILE EXT CUFF MD (GLOVE) ×1 IMPLANT
GOWN STRL REUS W/ TWL LRG LVL3 (GOWN DISPOSABLE) ×2 IMPLANT
GOWN STRL REUS W/TWL LRG LVL3 (GOWN DISPOSABLE) ×4
KIT MARKER MARGIN INK (KITS) ×2 IMPLANT
LIQUID BAND (GAUZE/BANDAGES/DRESSINGS) ×2 IMPLANT
NDL HYPO 25X1 1.5 SAFETY (NEEDLE) ×1 IMPLANT
NDL SAFETY ECLIPSE 18X1.5 (NEEDLE) IMPLANT
NEEDLE HYPO 18GX1.5 SHARP (NEEDLE) ×2
NEEDLE HYPO 25X1 1.5 SAFETY (NEEDLE) ×4 IMPLANT
NS IRRIG 1000ML POUR BTL (IV SOLUTION) ×1 IMPLANT
PACK BASIN DAY SURGERY FS (CUSTOM PROCEDURE TRAY) ×2 IMPLANT
PENCIL BUTTON HOLSTER BLD 10FT (ELECTRODE) ×2 IMPLANT
SLEEVE SCD COMPRESS KNEE MED (MISCELLANEOUS) ×2 IMPLANT
SPONGE GAUZE 4X4 12PLY STER LF (GAUZE/BANDAGES/DRESSINGS) ×1 IMPLANT
SPONGE LAP 4X18 X RAY DECT (DISPOSABLE) ×3 IMPLANT
SUT MNCRL AB 4-0 PS2 18 (SUTURE) ×2 IMPLANT
SUT SILK 2 0 SH (SUTURE) IMPLANT
SUT VICRYL 3-0 CR8 SH (SUTURE) ×2 IMPLANT
SYR CONTROL 10ML LL (SYRINGE) ×3 IMPLANT
TOWEL OR 17X24 6PK STRL BLUE (TOWEL DISPOSABLE) ×2 IMPLANT
TOWEL OR NON WOVEN STRL DISP B (DISPOSABLE) ×2 IMPLANT
TUBE CONNECTING 20X1/4 (TUBING) IMPLANT
YANKAUER SUCT BULB TIP NO VENT (SUCTIONS) IMPLANT

## 2015-02-08 NOTE — Progress Notes (Signed)
Assisted Dr. Oren Bracket with left, ultrasound guided, pectoralis block. Side rails up, monitors on throughout procedure. See vital signs in flow sheet. Tolerated Procedure well.

## 2015-02-08 NOTE — Progress Notes (Signed)
Assisted nuc med tech # [id number] with nuc med inj. Side rails up, monitors on throughout procedure. See vital signs in flow sheet. Tolerated Procedure well. 

## 2015-02-08 NOTE — Anesthesia Preprocedure Evaluation (Addendum)
Anesthesia Evaluation  Patient identified by MRN, date of birth, ID band Patient awake    Reviewed: Allergy & Precautions, H&P , NPO status , Patient's Chart, lab work & pertinent test results  Airway Mallampati: I  TM Distance: >3 FB Neck ROM: Full    Dental no notable dental hx. (+) Teeth Intact, Dental Advisory Given   Pulmonary neg pulmonary ROS,  breath sounds clear to auscultation  Pulmonary exam normal       Cardiovascular negative cardio ROS  Rhythm:Regular Rate:Normal     Neuro/Psych negative neurological ROS  negative psych ROS   GI/Hepatic negative GI ROS, Neg liver ROS,   Endo/Other  diabetes  Renal/GU negative Renal ROS  negative genitourinary   Musculoskeletal  (+) Arthritis -, Osteoarthritis,    Abdominal   Peds  Hematology negative hematology ROS (+)   Anesthesia Other Findings   Reproductive/Obstetrics negative OB ROS                            Anesthesia Physical Anesthesia Plan  ASA: II  Anesthesia Plan: General and Regional   Post-op Pain Management:    Induction: Intravenous  Airway Management Planned: LMA  Additional Equipment:   Intra-op Plan:   Post-operative Plan: Extubation in OR  Informed Consent: I have reviewed the patients History and Physical, chart, labs and discussed the procedure including the risks, benefits and alternatives for the proposed anesthesia with the patient or authorized representative who has indicated his/her understanding and acceptance.   Dental advisory given  Plan Discussed with: CRNA  Anesthesia Plan Comments:         Anesthesia Quick Evaluation

## 2015-02-08 NOTE — Op Note (Signed)
Preoperative Diagnosis: cancer left breast  Postoprative Diagnosis: cancer left breast  Procedure: Procedure(s): Blue Dye injection left breast,RADIOACTIVE SEED LOCALIZATION LUMPECTOMY WITH LEFT AXILLARY SENTINEL LYMPH NODE BIOPSY   Surgeon: Excell Seltzer T   Assistants: None  Anesthesia:  General LMA anesthesia  Indications: Patient is a 69 year old female with a recent diagnosis of invasive ductal carcinoma of the left breast with a small mass immediately beneath the lateral aspect of the left areola. After extensive preoperative workup and discussion detailed elsewhere we have elected to proceed with radioactive seed localized lumpectomy with left axillary sentinel lymph node biopsy as initial surgical therapy.    Procedure Detail:  Preoperatively the patient underwent successful accurate placement of radioactive seed at the tumor site. In the preoperative area the placement of the seeds was confirmed with the neoprobe. Preoperatively in the holding area she underwent injection of 1 mCi of technetium sulfur colloid intradermally around the left nipple.Patient was brought to the operating room, placed in the supine position on the operating table, and laryngeal mask general anesthesia induced. Under sterile technique after patient timeout I injected 5 mL of dilute methylene blue underneath the left areola and massaged this for several minutes. The entire breast and axilla and upper arm were widely sterilely prepped and draped. She received preoperative IV antibiotics. Patient timeout was again performed and correct procedure verified. The lumpectomy was approached initially. The area of maximum counts was localized which was just beneath the lateral inferior areola on the left. I made an elliptical curvilinear incision excising a portion of the lateral areolar skin directly overlying the tumor. Dissection was then carried down into the breast tissue widening the dissection out around the area  of high counts. Dissection was deepened and using the neoprobe for guidance the seed was excised keeping it in the center of the specimen. The specimen was removed and oriented with ink. Specimen mammography was obtained showing the seton marking clip in the center of the specimen. The wound was irrigated and complete hemostasis obtained. Margins were marked with clips. The deep breast subcutaneous tissue was closed with interrupted 3-0 Vicryl. The neoprobe was then used to localize a hot area in the left axilla. A small transverse incision made and dissection carried down through subcutaneous tissue and the clavipectoral fascia incised. Using the neoprobe for guidance I bluntly dissected down onto a node with elevated counts and date blue dye. It was a separate adjacent node with mildly elevated counts. Both of these were completely excised. Ex vivo they had counts of approximately 40o & 100 respectively with background in the axilla of less than 10. These were sent as left axillary sentinel lymph node #1 and #2. Hemostasis was assured and the deep tissue closed with interrupted 3-0 Vicryl. The skin incisions were infiltrated with Marcaine and closed with subcuticular 5-0 Monocryl and Dermabond. Sponge needle and history counts were correct.    Findings: As above  Estimated Blood Loss:  Minimal         Drains: none  Blood Given: none          Specimens: #1 left breast lumpectomy    #2 left axillary sentinel lymph node     #3 left axillary sentinel lymph node        Complications:  * No complications entered in OR log *         Disposition: PACU - hemodynamically stable.         Condition: stable

## 2015-02-08 NOTE — Anesthesia Procedure Notes (Addendum)
Anesthesia Regional Block:  Pectoralis block  Pre-Anesthetic Checklist: ,, timeout performed, Correct Patient, Correct Site, Correct Laterality, Correct Procedure, Correct Position, site marked, Risks and benefits discussed, pre-op evaluation, post-op pain management  Laterality: Left  Prep: Maximum Sterile Barrier Precautions used and chloraprep       Needles:  Injection technique: Single-shot  Needle Type: Echogenic Stimulator Needle     Needle Length: 9cm 9 cm Needle Gauge: 21 and 21 G    Additional Needles:  Procedures: ultrasound guided (picture in chart) Pectoralis block Narrative:  Start time: 02/08/2015 12:55 PM End time: 02/08/2015 1:03 PM Injection made incrementally with aspirations every 5 mL. Anesthesiologist: Roderic Palau  Additional Notes: 2% Lidocaine skin wheel.

## 2015-02-08 NOTE — Discharge Instructions (Signed)
[facility] °Office Phone Number [phone] ° °BREAST BIOPSY/ PARTIAL MASTECTOMY: POST OP INSTRUCTIONS ° °Always review your discharge instruction sheet given to you by the facility where your surgery was performed. ° °IF YOU HAVE DISABILITY OR FAMILY LEAVE FORMS, YOU MUST BRING THEM TO THE OFFICE FOR PROCESSING.  DO NOT GIVE THEM TO YOUR DOCTOR. ° °1. A prescription for pain medication may be given to you upon discharge.  Take your pain medication as prescribed, if needed.  If narcotic pain medicine is not needed, then you may take acetaminophen (Tylenol) or ibuprofen (Advil) as needed. °2. Take your usually prescribed medications unless otherwise directed °3. If you need a refill on your pain medication, please contact your pharmacy.  They will contact our office to request authorization.  Prescriptions will not be filled after 5pm or on week-ends. °4. You should eat very light the first 24 hours after surgery, such as soup, crackers, pudding, etc.  Resume your normal diet the day after surgery. °5. Most patients will experience some swelling and bruising in the breast.  Ice packs and a good support bra will help.  Swelling and bruising can take several days to resolve.  °6. It is common to experience some constipation if taking pain medication after surgery.  Increasing fluid intake and taking a stool softener will usually help or prevent this problem from occurring.  A mild laxative (Milk of Magnesia or Miralax) should be taken according to package directions if there are no bowel movements after 48 hours. °7. Unless discharge instructions indicate otherwise, you may remove your bandages 24-48 hours after surgery, and you may shower at that time.  You may have steri-strips (small skin tapes) in place directly over the incision.  These strips should be left on the skin for 7-10 days.  If your surgeon used skin glue on the incision, you may shower in 24 hours.  The glue will flake off over the  next 2-3 weeks.  Any sutures or staples will be removed at the office during your follow-up visit. °8. ACTIVITIES:  You may resume regular daily activities (gradually increasing) beginning the next day.  Wearing a good support bra or sports bra minimizes pain and swelling.  You may have sexual intercourse when it is comfortable. °a. You may drive when you no longer are taking prescription pain medication, you can comfortably wear a seatbelt, and you can safely maneuver your car and apply brakes. °b. RETURN TO WORK:  ______________________________________________________________________________________ °9. You should see your doctor in the office for a follow-up appointment approximately two weeks after your surgery.  Your doctor’s nurse will typically make your follow-up appointment when she calls you with your pathology report.  Expect your pathology report 2-3 business days after your surgery.  You may call to check if you do not hear from us after three days. °10. OTHER INSTRUCTIONS: _______________________________________________________________________________________________ _____________________________________________________________________________________________________________________________________ °_____________________________________________________________________________________________________________________________________ °_____________________________________________________________________________________________________________________________________ ° °WHEN TO CALL YOUR DOCTOR: °1. Fever over 101.0 °2. Nausea and/or vomiting. °3. Extreme swelling or bruising. °4. Continued bleeding from incision. °5. Increased pain, redness, or drainage from the incision. ° °The clinic staff is available to answer your questions during regular business hours.  Please don’t hesitate to call and ask to speak to one of the nurses for clinical concerns.  If you have a medical emergency, go to the nearest  emergency room or call 911.  A surgeon from [facility] is always on call at the hospital. ° °For further questions, please visit [email or web address]  ° ° ° °  Post Anesthesia Home Care Instructions ° °Activity: °Get plenty of rest for the remainder of the day. A responsible adult should stay with you for 24 hours following the procedure.  °For the next 24 hours, DO NOT: °-Drive a car °-Operate machinery °-Drink alcoholic beverages °-Take any medication unless instructed by your physician °-Make any legal decisions or sign important papers. ° °Meals: °Start with liquid foods such as gelatin or soup. Progress to regular foods as tolerated. Avoid greasy, spicy, heavy foods. If nausea and/or vomiting occur, drink only clear liquids until the nausea and/or vomiting subsides. Call your physician if vomiting continues. ° °Special Instructions/Symptoms: °Your throat may feel dry or sore from the anesthesia or the breathing tube placed in your throat during surgery. If this causes discomfort, gargle with warm salt water. The discomfort should disappear within 24 hours. ° °If you had a scopolamine patch placed behind your ear for the management of post- operative nausea and/or vomiting: ° °1. The medication in the patch is effective for 72 hours, after which it should be removed.  Wrap patch in a tissue and discard in the trash. Wash hands thoroughly with soap and water. °2. You may remove the patch earlier than 72 hours if you experience unpleasant side effects which may include dry mouth, dizziness or visual disturbances. °3. Avoid touching the patch. Wash your hands with soap and water after contact with the patch. °  ° °

## 2015-02-08 NOTE — Anesthesia Postprocedure Evaluation (Signed)
  Anesthesia Post-op Note  Patient: Gabriela [name]  Procedure(s) Performed: Procedure(s): RADIOACTIVE SEED LOCALIZATION LUMPECTOMY WITH LEFT AXILLARY SENTINEL LYMPH NODE BIOPSY (Left)  Patient Location: PACU  Anesthesia Type:General and block  Level of Consciousness: awake and alert   Airway and Oxygen Therapy: Patient Spontanous Breathing  Post-op Pain: mild  Post-op Assessment: Post-op Vital signs reviewed, Patient's Cardiovascular Status Stable and Respiratory Function Stable  Post-op Vital Signs: Reviewed  Filed Vitals:   02/08/15 1600  BP: 134/77  Pulse: 73  Temp:   Resp: 16    Complications: No apparent anesthesia complications

## 2015-02-08 NOTE — Interval H&P Note (Signed)
History and Physical Interval Note:  02/08/2015 1:28 PM  Gabriela [name]  has presented today for surgery, with the diagnosis of cancer left breast  The various methods of treatment have been discussed with the patient and family. After consideration of risks, benefits and other options for treatment, the patient has consented to  Procedure(s): RADIOACTIVE SEED LOCALIZATION LUMPECTOMY WITH LEFT AXILLARY SENTINEL LYMPH NODE BIOPSY (Left) as a surgical intervention .  The patient's history has been reviewed, patient examined, no change in status, stable for surgery.  I have reviewed the patient's chart and labs.  Questions were answered to the patient's satisfaction.     Carlitos Bottino T

## 2015-02-08 NOTE — Transfer of Care (Signed)
Immediate Anesthesia Transfer of Care Note  Patient: Gabriela [name]  Procedure(s) Performed: Procedure(s): RADIOACTIVE SEED LOCALIZATION LUMPECTOMY WITH LEFT AXILLARY SENTINEL LYMPH NODE BIOPSY (Left)  Patient Location: PACU  Anesthesia Type:General and Regional  Level of Consciousness: awake and alert   Airway & Oxygen Therapy: Patient Spontanous Breathing and Patient connected to nasal cannula oxygen  Post-op Assessment: Report given to RN and Post -op Vital signs reviewed and stable  Post vital signs: Reviewed and stable  Last Vitals:  Filed Vitals:   02/08/15 1504  BP:   Pulse: 80  Resp: 14    Complications: No apparent anesthesia complications

## 2015-02-08 NOTE — H&P (Signed)
History of Present Illness Gabriela [name] T. Gabriela Buchan MD; 01/28/2015 10:39 AM) Patient words: breast cancer.  The patient is a 69 year old female who presents with breast cancer. [name] is a 69YO post menopausal female referredfor evaluation of recently diagnosed carcinoma of the left breast. About 2-1/2 weeks ago she felt some discomfort in her left breast which prompted her to do a careful breast self-exam. She was able to feel a small lump just beneath the left nipple. She had had a negative mammogram in May of last year. Her imaging and workup to date has been done in [place] at [facility]. Subsequent imaging included diagnostic mamogram that she reports was negative and ultrasound showing an 8 mm suspicious mass. An ultrasound guided breast biopsy was performed about one week ago. I do not have the primary pathology report but secondary reports sure her physician that indicates this showed invasive mammary carcinoma. Reportedly low grade and ER/PR positive. She is seen now in the office for initial treatment planning. She does have a personal history of a benign left breast mass that I removed over 15 years ago. Findings at that time were the following: Tumor size: 0.8 cm Tumor grade: Unknown Estrogen Receptor: Positive Progesterone Receptor: Positive Her-2 neu: Unknown Lymph node status: Negative   Other Problems Gabriela [name], CMA; 01/28/2015 9:40 AM) Breast Cancer Diabetes Mellitus Lump In Breast  Past Surgical History Gabriela [name], CMA; 01/28/2015 9:40 AM) Breast Biopsy Left. Oral Surgery Shoulder Surgery Left. Tonsillectomy  Diagnostic Studies History Gabriela [name], CMA; 01/28/2015 9:40 AM) Colonoscopy 5-10 years ago Mammogram within last year Pap Smear 1-5 years ago  Allergies Gabriela [name], CMA; 01/28/2015 9:41 AM) Sulfur *CHEMICALS* Hives.  Medication History Gabriela [name], [place]; 01/28/2015 9:41 AM) No Current Medications Medications Reconciled  Social History  Gabriela [name], [place]; 01/28/2015 9:40 AM) Caffeine use Tea. No alcohol use No drug use Tobacco use Never smoker.  Family History Gabriela [name], [place]; 01/28/2015 9:40 AM) Breast Cancer Family Members In General. Cervical Cancer Family Members In General. Colon Cancer Family Members In General. Diabetes Mellitus Family Members In General, Sister. Thyroid problems Sister.  Pregnancy / Birth History Gabriela [name], CMA; 01/28/2015 9:40 AM) Age at menarche 54 years. Age of menopause 73-60 Contraceptive History Contraceptive implant. Gravida 4 Maternal age 28-25 Para 3  Review of Systems Gabriela [name] CMA; 01/28/2015 9:40 AM) General Not Present- Appetite Loss, Chills, Fatigue, Fever, Night Sweats, Weight Gain and Weight Loss. Skin Present- Dryness. Not Present- Change in Wart/Mole, Hives, Jaundice, New Lesions, Non-Healing Wounds, Rash and Ulcer. HEENT Present- Seasonal Allergies and Wears glasses/contact lenses. Not Present- Earache, Hearing Loss, Hoarseness, Nose Bleed, Oral Ulcers, Ringing in the Ears, Sinus Pain, Sore Throat, Visual Disturbances and Yellow Eyes. Respiratory Not Present- Bloody sputum, Chronic Cough, Difficulty Breathing, Snoring and Wheezing. Breast Present- Breast Mass and Breast Pain. Not Present- Nipple Discharge and Skin Changes. Cardiovascular Not Present- Chest Pain, Difficulty Breathing Lying Down, Leg Cramps, Palpitations, Rapid Heart Rate, Shortness of Breath and Swelling of Extremities. Gastrointestinal Not Present- Abdominal Pain, Bloating, Bloody Stool, Change in Bowel Habits, Chronic diarrhea, Constipation, Difficulty Swallowing, Excessive gas, Gets full quickly at meals, Hemorrhoids, Indigestion, Nausea, Rectal Pain and Vomiting. Female Genitourinary Not Present- Frequency, Nocturia, Painful Urination, Pelvic Pain and Urgency. Musculoskeletal Present- Muscle Weakness. Not Present- Back Pain, Joint Pain, Joint Stiffness, Muscle Pain and Swelling of  Extremities. Neurological Not Present- Decreased Memory, Fainting, Headaches, Numbness, Seizures, Tingling, Tremor, Trouble walking and Weakness. Psychiatric Not Present- Anxiety, Bipolar, Change in Sleep Pattern, Depression,  Fearful and Frequent crying. Endocrine Present- New Diabetes. Not Present- Cold Intolerance, Excessive Hunger, Hair Changes, Heat Intolerance and Hot flashes. Hematology Not Present- Easy Bruising, Excessive bleeding, Gland problems, HIV and Persistent Infections.   Vitals Gabriela [name] CMA; 01/28/2015 9:41 AM) 01/28/2015 9:41 AM Weight: 181 lb Height: 68in Body Surface Area: 1.98 m Body Mass Index: 27.52 kg/m Temp.: 98.41F(Temporal)  Pulse: 62 (Regular)  Resp.: 17 (Unlabored)  BP: 142/78 (Sitting, Left Arm, Standard)    Physical Exam Gabriela [name] T. Ryszard Socarras MD; 01/28/2015 10:40 AM) The physical exam findings are as follows: Note:General: Alert, well-developed and well nourished Caucasian female, in no distress Skin: Warm and dry without rash or infection. HEENT: No palpable masses or thyromegaly. Sclera nonicteric. Pupils equal round and reactive. Oropharynx clear. Lymph nodes: No cervical, supraclavicular, or inguinal nodes palpable. Breasts: Large breasts bilaterally. Some bruising beneath the left nipple. There may be a subtle mass just beneath the left areola but I am not able to definitely feel anything discrete. No other abnormalities in either breast. No palpable axillary adenopathy. Lungs: Breath sounds clear and equal. No wheezing or increased work of breathing. Cardiovascular: Regular rate and rhythm without murmer. No JVD or edema. Peripheral pulses intact. No carotid bruits. Abdomen: Nondistended. Soft and nontender. No masses palpable. No organomegaly. No palpable hernias. Extremities: No edema or joint swelling or deformity. No chronic venous stasis changes. Neurologic: Alert and fully oriented. Gait normal. No focal weakness. Psychiatric:  Normal mood and affect. Thought content appropriate with normal judgement and insight    Assessment & Plan Gabriela [name] T. Jamaria Amborn MD; 01/28/2015 10:42 AM) MALIGNANT NEOPLASM OF LOWER-OUTER QUADRANT OF LEFT FEMALE BREAST (174.5  C50.512) Impression: 69 year old female with a new diagnosis of cancer of the left breast, lower outer quadrant. Clinical stage 1A, ER positive, PR positive, HER-2 status pending. I discussed with the patient and family members present today initial surgical treatment options. We discussed options of breast conservation with lumpectomy or total mastectomy and sentinal lymph node biopsy/dissection. Options for reconstruction were discussed. After discussion they have elected to proceed with radioactive seed localized left breast lumpectomy with left axillary sentinel lymph node biopsy. We discussed the indications and nature of the procedure, and expected recovery, in detail. Surgical risks including anesthetic complications, cardiorespiratory complications, bleeding, infection, wound healing complications, blood clots, lymphedema, local and distant recurrence and possible need for further surgery based on the final pathology was discussed and understood. Chemotherapy, hormonal therapy and radiation therapy have been discussed. They have been provided with literature regarding the treatment of breast cancer. All questions were answered. They understand and agree to proceed and we will go ahead with scheduling. We discussed that I would like to get a preoperative MRI and I need to personally review her imaging and the complete prognostic panel prior to proceeding. Current Plans  Schedule for Surgery radioactive seed localized left breast lumpectomy with left axillary sentinel lymph node biopsy as an outpatient, pending review of recent studies and MRI

## 2015-02-10 ENCOUNTER — Ambulatory Visit: Payer: Medicare Other | Admitting: Radiation Oncology

## 2015-02-10 ENCOUNTER — Ambulatory Visit: Payer: Medicare Other

## 2015-02-10 ENCOUNTER — Encounter (HOSPITAL_BASED_OUTPATIENT_CLINIC_OR_DEPARTMENT_OTHER): Payer: Self-pay | Admitting: General Surgery

## 2015-02-12 ENCOUNTER — Other Ambulatory Visit: Payer: Self-pay | Admitting: *Deleted

## 2015-02-12 ENCOUNTER — Telehealth: Payer: Self-pay | Admitting: *Deleted

## 2015-02-12 NOTE — Telephone Encounter (Signed)
Received order for oncotype testing order from Dr. Jana Hakim. Requisition sent to pathology. Received by Alyse Low.

## 2015-02-17 ENCOUNTER — Other Ambulatory Visit: Payer: Self-pay | Admitting: Oncology

## 2015-02-18 ENCOUNTER — Telehealth: Payer: Self-pay | Admitting: *Deleted

## 2015-02-18 ENCOUNTER — Encounter (HOSPITAL_COMMUNITY): Payer: Self-pay

## 2015-02-18 NOTE — Telephone Encounter (Signed)
Manuela Schwartz called in reference to an Oncotype test received for this patient.  Would like to know if Dr. Jana Hakim is the provider and confirm office fax number on file.  She has [phone] as fax number.  Confirmed  This is the navigator fax number.  Reports she will need patient records and the H.I.M. Fax [phone] given for records.

## 2015-02-19 ENCOUNTER — Telehealth: Payer: Self-pay | Admitting: *Deleted

## 2015-02-19 NOTE — Telephone Encounter (Signed)
Received oncotype score of 14/9%. Copy given to Dr. Jana Hakim. Original to HIM for scanning.

## 2015-03-01 ENCOUNTER — Telehealth: Payer: Self-pay | Admitting: *Deleted

## 2015-03-01 LAB — SURGICAL PATHOLOGY

## 2015-03-01 NOTE — Telephone Encounter (Signed)
TC from [place] @ [facility] requesting Oncotype results from 02/08/15 and fax to [phone]. This was done.

## 2015-03-02 ENCOUNTER — Encounter: Payer: Self-pay | Admitting: *Deleted

## 2015-03-02 NOTE — Progress Notes (Signed)
Received call from Vicente Males at [facility] that she needs a copy of patient's Oncotype DX report faxed to [phone]. Report faxed.

## 2015-03-05 ENCOUNTER — Telehealth: Payer: Self-pay | Admitting: *Deleted

## 2015-03-05 NOTE — Telephone Encounter (Signed)
This RN spoke with pt post MD review per Barbaraann Share' inquiry " the radiation doctor wanted me to ask about my results and verify that I do not need other treatment then radiation "  Per Oncotype score pt would not benefit by use of chemotherapy per MD review.  Plan is to proceed with radiation and then start antiestrogens. Presently [facility] is to start radiation on 5/8 under Dr Donella Stade in [place].  All the above discussed with pt who verbalized understanding and appreciation of call.  " that is good news to me ".  This RN validated pt's statement.  Appointment for follow up with Dr Jannifer Rodney is scheduled for 03/22/2015 reviewed and Pt states she will keep that appointment.  This note will be sent to Dr Donella Stade in [place] for communication purposes.

## 2015-03-07 NOTE — Consult Note (Signed)
Reason for Visit: This 69 year old Female patient presents to the clinic for initial evaluation of  Breast cancer .   Referred by Dr. Excell Seltzer.  Diagnosis:  Chief Complaint/Diagnosis   69 year old female status post wide local excision and sentinel node biopsy of her left breast for a superficial stage I (T1b N0 M0) ER/PR positive HER-2/neu negative invasive mammary carcinoma. Patient for evaluation for whole breast radiation plus aromatase inhibitor  Pathology Report Pathology report reviewed   Imaging Report MRI scan requested for my review mammograms ultrasound reviewed   Referral Report Clinical notes reviewed   Planned Treatment Regimen Whole breast radiation plus aromatase inhibitor   HPI   Patient is a 69 year old female who presents with throbbing pain in her left breast and a palpable mass superficially at the 3:00 position. Was not confirmed on mammogram although ultrasound demonstrated an approximate 1 cm mass in that position and she went underwent ultrasound-guided biopsy positive for invasive mammary carcinoma. Tumor was ER/PR positive HER-2/neu not overexpressed. Follow-up bilateral breast MRI confirming area of enhancing mass in the subareolar region measuring approximately 9 mm. Right breast was unremarkable. Patient don't wide local excision and sentinel node biopsy confirming a 0.9 cm invasive ductal carcinoma grade 1 with low-grade ductal carcinoma in situ present. Margins were clear but close with 0.2 cm to invasive component and less than 0.1 cm to the in situ component. Lymph vascular invasion not identified. 2 sentinel lymph nodes were negative for metastatic disease. I believe the patient had Oncotype DX testing I'm trying to obtain that for my records. She is seen today for consideration of whole breast radiation. She is doing well she specifically denies breast tenderness cough or bone pain.  Past Hx:    Breast Cancer:    Diabetes:   Past, Family and Social  History:  Past Medical History positive   Cardiovascular hyperlipidemia   Genitourinary Urinary incontinence   Endocrine diabetes mellitus   Past Surgical History Shoulder arthroscopic tonsillectomy colonoscopy   Past Medical History Comments Arthritis, history of closed head injury   Family History positive   Family History Comments Mother expired from colon cancer, aunt with breast and cervical cancer in her 56s   Social History noncontributory   Additional Past Medical and Surgical History Accompanied by 2 daughters today   Allergies:   Sulfur: Rash  Home Meds:  Home Medications: Medication Instructions Status  Fish Oil 1200 mg oral capsule 1 cap(s) orally once a day Active  Vitamin D3 2000 intl units oral capsule 1 cap(s) orally once a day Active   Review of Systems:  General negative   Performance Status (ECOG) 0   Skin negative   Breast see HPI   Ophthalmologic negative   ENMT negative   Respiratory and Thorax negative   Cardiovascular negative   Gastrointestinal negative   Genitourinary negative   Musculoskeletal negative   Neurological negative   Psychiatric negative   Hematology/Lymphatics negative   Endocrine negative   Allergic/Immunologic negative   Review of Systems   denies any weight loss, fatigue, weakness, fever, chills or night sweats. Patient denies any loss of vision, blurred vision. Patient denies any ringing  of the ears or hearing loss. No irregular heartbeat. Patient denies heart murmur or history of fainting. Patient denies any chest pain or pain radiating to her upper extremities. Patient denies any shortness of breath, difficulty breathing at night, cough or hemoptysis. Patient denies any swelling in the lower legs. Patient denies any nausea vomiting, vomiting  of blood, or coffee ground material in the vomitus. Patient denies any stomach pain. Patient states has had normal bowel movements no significant constipation or  diarrhea. Patient denies any dysuria, hematuria or significant nocturia. Patient denies any problems walking, swelling in the joints or loss of balance. Patient denies any skin changes, loss of hair or loss of weight. Patient denies any excessive worrying or anxiety or significant depression. Patient denies any problems with insomnia. Patient denies excessive thirst, polyuria, polydipsia. Patient denies any swollen glands, patient denies easy bruising or easy bleeding. Patient denies any recent infections, allergies or URI. Patient "s visual fields have not changed significantly in recent time.   Nursing Notes:  Nursing Vital Signs and Chemo Nursing Nursing Notes: *CC Vital Signs Flowsheet:   20-Apr-16 10:26  Temp Temperature 97.1  Pulse Pulse 67  Respirations Respirations 18  SBP SBP 118  DBP DBP 76  Pain Scale (0-10)  0  Current Weight (kg) (kg) 82.9  Height (cm) centimeters 173  BSA (m2) 1.9   Physical Exam:  General/Skin/HEENT:  General normal   Skin normal   Eyes normal   ENMT normal   Head and Neck normal   Additional PE Well-developed well-nourished female in NAD lungs are clear to A&P cardiac examination shows regular rate and rhythm. Left breast in the adjacent to the nipple areolar complex is wide local excision scar which is healing well. No dominant mass or nodularity is noted in either breast in 2 positions examined. No axillary or subclavicular adenopathy is appreciated. Abdomen is benign.   Breasts/Resp/CV/GI/GU:  Respiratory and Thorax normal   Cardiovascular normal   Gastrointestinal normal   Genitourinary normal   MS/Neuro/Psych/Lymph:  Musculoskeletal normal   Neurological normal   Lymphatics normal   Relevent Results:   Relevant Scans and Labs MRI scans have been requested for my review   Assessment and Plan: Impression:   Stage I invasive mammary carcinoma of the left breast as was wide local excision and sentinel node biopsy ER/PR positive  HER-2/neu not overexpressed in 69 year old female for adjuvant whole breast radiation plus aromatase inhibitor therapy. Plan:   At this time I have recommended whole breast radiation therapy. Would plan on delivering 5000 cGy over 5 weeks +1600 cGy electron scar boost based on the close margin. Risks and benefits of treatment including skin reaction, fatigue, thickening of the breast, alteration of blood counts, inclusion of superficial lung all were discussed in detail with the patient and her family. They all seem to comprehend my treatment plan well. I have set up and ordered CT simulation for early next week. Patient will also be candidate for aromatase inhibitor therapy after completion of radiation and that will be handled by medical oncology in [place]. I still like to obtain the Oncotype DX results from [facility] prior to beginning treatment.  I would like to take this opportunity for allowing me to participate in the care of your patient.Marland Kitchen  Fax to Physician:  Physicians To Recieve Fax: Paulita Cradle, PA Chauncey Cruel - [phone].  Electronic Signatures: Kansas Spainhower, Roda Shutters (MD)  (Signed 20-Apr-16 12:00)  Authored: HPI, Diagnosis, Past Hx, PFSH, Allergies, Home Meds, ROS, Nursing Notes, Physical Exam, Relevent Results, Encounter Assessment and Plan, Fax to Physician   Last Updated: 20-Apr-16 12:00 by Armstead Peaks (MD)

## 2015-03-08 ENCOUNTER — Ambulatory Visit: Payer: Medicare Other | Admitting: Radiation Oncology

## 2015-03-11 ENCOUNTER — Ambulatory Visit
Admission: RE | Admit: 2015-03-11 | Discharge: 2015-03-11 | Disposition: A | Discharge status: Home / Self Care | Payer: Medicare Other | Source: Ambulatory Visit | Attending: Radiation Oncology | Admitting: Radiation Oncology

## 2015-03-11 DIAGNOSIS — Z510 Encounter for antineoplastic radiation therapy: Secondary | ICD-10-CM | POA: Insufficient documentation

## 2015-03-11 DIAGNOSIS — C50912 Malignant neoplasm of unspecified site of left female breast: Secondary | ICD-10-CM | POA: Diagnosis present

## 2015-03-12 ENCOUNTER — Other Ambulatory Visit: Payer: Self-pay | Admitting: *Deleted

## 2015-03-12 DIAGNOSIS — C50912 Malignant neoplasm of unspecified site of left female breast: Secondary | ICD-10-CM

## 2015-03-15 ENCOUNTER — Ambulatory Visit
Admission: RE | Admit: 2015-03-15 | Discharge: 2015-03-15 | Disposition: A | Discharge status: Home / Self Care | Payer: Medicare Other | Source: Ambulatory Visit | Attending: Radiation Oncology | Admitting: Radiation Oncology

## 2015-03-15 DIAGNOSIS — C50912 Malignant neoplasm of unspecified site of left female breast: Secondary | ICD-10-CM | POA: Diagnosis not present

## 2015-03-16 ENCOUNTER — Ambulatory Visit
Admission: RE | Admit: 2015-03-16 | Discharge: 2015-03-16 | Disposition: A | Discharge status: Home / Self Care | Payer: Medicare Other | Source: Ambulatory Visit | Attending: Radiation Oncology | Admitting: Radiation Oncology

## 2015-03-16 DIAGNOSIS — C50912 Malignant neoplasm of unspecified site of left female breast: Secondary | ICD-10-CM | POA: Diagnosis not present

## 2015-03-17 ENCOUNTER — Ambulatory Visit
Admission: RE | Admit: 2015-03-17 | Discharge: 2015-03-17 | Disposition: A | Discharge status: Home / Self Care | Payer: Medicare Other | Source: Ambulatory Visit | Attending: Radiation Oncology | Admitting: Radiation Oncology

## 2015-03-17 DIAGNOSIS — C50912 Malignant neoplasm of unspecified site of left female breast: Secondary | ICD-10-CM | POA: Diagnosis not present

## 2015-03-18 ENCOUNTER — Ambulatory Visit
Admission: RE | Admit: 2015-03-18 | Discharge: 2015-03-18 | Disposition: A | Discharge status: Home / Self Care | Payer: Medicare Other | Source: Ambulatory Visit | Attending: Radiation Oncology | Admitting: Radiation Oncology

## 2015-03-18 DIAGNOSIS — C50912 Malignant neoplasm of unspecified site of left female breast: Secondary | ICD-10-CM | POA: Diagnosis not present

## 2015-03-19 ENCOUNTER — Ambulatory Visit: Payer: Medicare Other

## 2015-03-19 ENCOUNTER — Ambulatory Visit
Admission: RE | Admit: 2015-03-19 | Discharge: 2015-03-19 | Disposition: A | Discharge status: Home / Self Care | Payer: Medicare Other | Source: Ambulatory Visit | Attending: Radiation Oncology | Admitting: Radiation Oncology

## 2015-03-21 NOTE — Progress Notes (Signed)
[place]  [phone] [phone]     ID: Gabriela [name] DOB: 12-14-1945  MR#: [id number]  POE#:[id number]  Patient Care Team: Gabriela Axe, MD as PCP - General (Internal Medicine) Gabriela Seltzer, MD as Consulting Physician (General Surgery) PCP: Gabriela Axe, MD GYN: SU: Gabriela [name] M.D. OTHER MD: Gabriela [name] "Gabriela" [name], [place]; Berton Mount MD  CHIEF COMPLAINT: Estrogen receptor positive breast cancer  CURRENT TREATMENT: Adjuvant radiation   BREAST CANCER HISTORY: From the original intake note:  The patient herself noted a lump in her left breast, associated with some discomfort. She brought it to her physician's attention and on 01/15/2015 underwent left digital mammography with tomography and ultrasonography at [facility]. This showed breast density to be category A. Compression images were negative but on physical exam there was a firm palpable mobile nodule at the 3:00 position of the left breast. By ultrasound this measured 9 mm maximally. There is no note regarding axillary adenopathy in the mammographic report.  On 01/18/2015 the patient underwent biopsy of the mass in question and this showed ([id number]; [id number]) an invasive mammary carcinoma, measuring 3 mm on the specimen, grade 1, estrogen receptor greater than 90% positive, progesterone receptor between 11 and 50% positive, with no HER-2 amplification  (Immunohistochemistry score 1+).  On 02/03/2015 the patient underwent bilateral breast MRI. This suggested a breast density category B. The right breast was negative and there were no abnormal appearing lymph nodes. In the left breast there was an irregular enhancing mass subareolar early measuring 9 mm.  Her subsequent history is as detailed below  INTERVAL HISTORY: Gabriela [name] returns today for follow-up of her breast cancer accompanied by HER-2 daughters. Since her last visit here she underwent left  lumpectomy and sentinel lymph node sampling 02/08/2015. The final pathology ([id number]) showed a 9 mm invasive ductal carcinoma, grade 1, repeat prognostic panel showing estrogen receptor 100% positivity, progesterone receptor 63% positive, with HER-2 not amplified, the signals ratio being 1.30 and the number per cell 1.75. Both sentinel lymph nodes were clear. Margins were clear although the in situ margin was focally less than a millimeter anteriorly and superiorly. She also had an Oncotype DX, with a score of 14, predicting a risk of recurrence outside of the breast within the next 10 years of 9% if her only treatment was tamoxifen for 5 years. It also predicts no benefit from chemotherapy.  She has a ready met with Dr. Donella [name] and started her radiation therapy 03/15/2015   REVIEW OF SYSTEMS:  Gabriela [name] did well with the surgery, with the initial pain quickly resolving. She had no infectious or bleeding problems. She is feeling "back to baseline" and mowed her very large yard using the push mower today. She has mild sinus problems. She tells me her blood sugars are well-controlled. She is tolerating the radiation [name] far with no side effects that she is aware of. A detailed review of systems today was otherwise noncontributory  PAST MEDICAL HISTORY: Past Medical History  Diagnosis Date  . Hyperlipemia   . Diabetes mellitus without complication     diet controlled  . Arthritis   . History of closed head injury 2012    accident work threw her 75f  . Chronic pain     from fall 2012  . Urinary incontinence     wears a pessery    PAST SURGICAL HISTORY: Past Surgical History  Procedure Laterality Date  . Shoulder arthroscopy  2012    left  .  Tonsillectomy    . Breast lumpectomy  1999    left  . Colonoscopy    . Radioactive seed guided mastectomy with axillary sentinel lymph node biopsy Left 02/08/2015    Procedure: RADIOACTIVE SEED LOCALIZATION LUMPECTOMY WITH LEFT AXILLARY SENTINEL LYMPH  NODE BIOPSY;  Surgeon: Gabriela Seltzer, MD;  Location: [place];  Service: General;  Laterality: Left;   FAMILY HISTORY No family history on file. The patient's father died in an automobile accident at age 33. The patient's mother died at the age of 46 from colon cancer. The patient has been told that her mother was on a medication during her pregnancy that may have caused the colon cancer. The patient's mother had a sister diagnosed with breast and cervical cancer in her 17s. She also had a brother who was killed in World War II 1. There is no other history of cancer in the family to the patient's knowledge   GYNECOLOGIC HISTORY:  No LMP recorded. Patient is postmenopausal. Menarche age 63, first live birth age 16. The patient is GX P3. She went through the change of life approximately age 30. She did not take hormone replacement. She never used birth control   SOCIAL HISTORY:  The patient and her husband Gabriela [name] are both retired. They live on 17 acres in our quite busy caring for that. At is also Gabriela [name]'s chief source of exercise. [name] is an occupational Programmer, applications in [place]. [name] is a Cabin crew in [place]. [name] works as a Librarian, academic for the twin [name] retirement home in [name]. The patient has 4 grandchildren. She is a Psychologist, forensic.    ADVANCED DIRECTIVES: In place   HEALTH MAINTENANCE: History  Substance Use Topics  . Smoking status: Never Smoker   . Smokeless tobacco: Not on file  . Alcohol Use: No     Colonoscopy:2008/current nodal clinic  PAP:2015  Bone density: 2007  Lipid panel:  Allergies  Allergen Reactions  . Oxycontin [Oxycodone Hcl]   . Sulfa Antibiotics Itching    Current Outpatient Prescriptions  Medication Sig Dispense Refill  . ALPHA LIPOIC ACID PO Take by mouth.    . cholecalciferol (VITAMIN D) 1000 UNITS tablet Take 1,000 Units by mouth daily.     No current facility-administered medications for  this visit.    OBJECTIVE: Middle-aged white woman who appears well Filed Vitals:   03/22/15 1650  BP: 126/70  Pulse: 70  Temp: 97.5 F (36.4 C)  Resp: 18     Body mass index is 27.83 kg/(m^2).    ECOG FS:0 - Asymptomatic  Sclerae unicteric, pupils round and equal Oropharynx clear, dentition in fair repair No cervical or supraclavicular adenopathy Lungs no rales or rhonchi Heart regular rate and rhythm Abd soft, nontender, positive bowel sounds MSK no focal spinal tenderness, no upper extremity lymphedema Neuro: nonfocal, well oriented, appropriate affect Breasts: The right breast is unremarkable. The left breast is status post lumpectomy, and is currently undergoing radiation. There is a minimal blush. There is minimal subjacent induration associated with the left axillary scar. There is slight deviation of the nipple laterally. Otherwise the incisions have healed very nicely, with no dehiscence or swelling. The left axilla is benign  LAB RESULTS:  CMP     Component Value Date/Time   NA 142 02/01/2015 0832   K 4.1 02/01/2015 0832   CO2 27 02/01/2015 0832   GLUCOSE 119 02/01/2015 0832   BUN 18.7 02/01/2015 0832   CREATININE 0.9 02/01/2015  2947   CALCIUM 8.9 02/01/2015 0832   PROT 6.5 02/01/2015 0832   ALBUMIN 3.8 02/01/2015 0832   AST 24 02/01/2015 0832   ALT 29 02/01/2015 0832   ALKPHOS 27* 02/01/2015 0832   BILITOT 0.50 02/01/2015 0832    INo results found for: SPEP, UPEP  Lab Results  Component Value Date   WBC 4.4 03/22/2015   NEUTROABS 3.4 02/01/2015   HGB 13.0 03/22/2015   HCT 39.8 03/22/2015   MCV 87.8 03/22/2015   PLT 176 03/22/2015      Chemistry      Component Value Date/Time   NA 142 02/01/2015 0832   K 4.1 02/01/2015 0832   CO2 27 02/01/2015 0832   BUN 18.7 02/01/2015 0832   CREATININE 0.9 02/01/2015 0832      Component Value Date/Time   CALCIUM 8.9 02/01/2015 0832   ALKPHOS 27* 02/01/2015 0832   AST 24 02/01/2015 0832   ALT 29 02/01/2015  0832   BILITOT 0.50 02/01/2015 0832       No results found for: LABCA2  No components found for: MLYYT035  No results for input(s): INR in the last 168 hours.  Urinalysis No results found for: COLORURINE, APPEARANCEUR, LABSPEC, PHURINE, GLUCOSEU, HGBUR, BILIRUBINUR, KETONESUR, PROTEINUR, UROBILINOGEN, NITRITE, LEUKOCYTESUR  STUDIES: No results found.  ASSESSMENT: 69 y.o. [place] woman status post left breast biopsy 01/18/2015 for a clinical T1b N0, stage IA invasive ductal carcinoma, grade 1, estrogen and progesterone receptor positive, HER-2 not amplified  (1) left lumpectomy and sentinel lymph node sampling 02/08/2015 showed a pT1b pN0, stage IA invasive ductal carcinoma, grade 1, with repeat HER-2 again negative. Margins were negative but close  (2) Oncotype DX showed a score of 14, predicting a risk of recurrence outside the breast in the next 10 years of 9% if the patient's only systemic treatment is tamoxifen for 5 years. It also protects no benefit from adjuvant chemotherapy.  (3) the patient will complete adjuvant radiation mid to late June  (4) tamoxifen to start 05/20/2015  PLAN: I went over Jaziah is final pathology report in detail. She understands that while the margins were close, they were negative. In particular there remains some controversy regarding how close a margin for ductal carcinoma in situ should be. While NCCN guidelines suggest a 2 mm margin is the minimum required, the [employer] consensus panel in 2015 suggested treating DCIS margins no differently than those for invasive disease. Since that time a large review on this topic has been published Dr. Zada Girt. While a new consensus statement is being prepared, when I get out of that review is that while yes there is an increased risk of recurrence with progressively closer margins, that association does not obtain in patients receive adjuvant radiation.  Accordingly I am very comfortable with her  surgical margins and that was the main message I wanted to come data her  We then discussed adjuvant therapy. Her Oncotype predicts no significant benefit from chemotherapy and a risk of recurrence outside the breast of 9% within 10 years if her only systemic therapy is tamoxifen. She might do even a little bit of that if she chooses anastrozole instead.  We then discussed the possible toxicities, side effects and complications of tamoxifen as compared to aromatase inhibitors. After much discussion we decided to start tamoxifen on 05/20/2015. If she tolerates it well she will receive this for 5 years, which I think in someone with a very good prognosis like her would be adequate. If  she does not tolerate it well we will switch to anastrozole  Note that she is considering total abdominal hysterectomy with bilateral salpingo-oophorectomy, because of prolapse problems. That obviates the endometrial polyps/cancer issue.  Laurian has a good understanding of the overall plan. She agrees with it. She knows the goal of treatment in her case is cure. She will call with any problems that may develop before her next visit here, which will be sometime in September. She knows to call for any problems that may develop before that visit.  Chauncey Cruel, MD   03/22/2015 5:44 PM Medical Oncology and Hematology [facility] [address] [place], [place] [address] Tel. [phone]    Fax. [phone]

## 2015-03-22 ENCOUNTER — Ambulatory Visit (HOSPITAL_BASED_OUTPATIENT_CLINIC_OR_DEPARTMENT_OTHER): Payer: Medicare Other | Admitting: Oncology

## 2015-03-22 ENCOUNTER — Other Ambulatory Visit: Payer: Self-pay | Admitting: Oncology

## 2015-03-22 ENCOUNTER — Inpatient Hospital Stay: Payer: Medicare Other | Attending: Oncology

## 2015-03-22 ENCOUNTER — Telehealth: Payer: Self-pay | Admitting: *Deleted

## 2015-03-22 ENCOUNTER — Ambulatory Visit
Admission: RE | Admit: 2015-03-22 | Discharge: 2015-03-22 | Disposition: A | Discharge status: Home / Self Care | Payer: Medicare Other | Source: Ambulatory Visit | Attending: Radiation Oncology | Admitting: Radiation Oncology

## 2015-03-22 VITALS — BP 126/70 | HR 70 | Temp 97.5°F | Resp 18 | Ht 68.0 in | Wt 183.0 lb

## 2015-03-22 DIAGNOSIS — Z170 Estrogen receptor positive status [ER+]: Secondary | ICD-10-CM

## 2015-03-22 DIAGNOSIS — C50912 Malignant neoplasm of unspecified site of left female breast: Secondary | ICD-10-CM

## 2015-03-22 LAB — CBC
HCT: 39.8 % (ref 35.0–47.0)
Hemoglobin: 13 g/dL (ref 12.0–16.0)
MCH: 28.6 pg (ref 26.0–34.0)
MCHC: 32.6 g/dL (ref 32.0–36.0)
MCV: 87.8 fL (ref 80.0–100.0)
Platelets: 176 10*3/uL (ref 150–440)
RBC: 4.54 MIL/uL (ref 3.80–5.20)
RDW: 13.2 % (ref 11.5–14.5)
WBC: 4.4 10*3/uL (ref 3.6–11.0)

## 2015-03-22 NOTE — Telephone Encounter (Signed)
Faxed Oncotype Dx results to Dr. Baruch Gouty per Dr. Virgie Dad request.

## 2015-03-22 NOTE — Progress Notes (Unsigned)
Addendum: Gabriela [name] case was not read presented at conference after her definitive surgery. She has a close margin to DCIS. Because I did not address this specifically with her when I saw her 03/19/2015, I am adding this note:  At the [facility] conference in 2015 the consensus was to treat DCIS margins the same as invasive margins. More recently, a large retrospective review of more than 2000 cases by Dr. Zada Girt found that while closeness of the margin in DCIS does predict for the possibility of her local recurrence, that association does not obtain in patients who had postoperative radiation  Accordingly I am very comfortable with the patient's close but negative margins for DCIS.

## 2015-03-23 ENCOUNTER — Ambulatory Visit
Admission: RE | Admit: 2015-03-23 | Discharge: 2015-03-23 | Disposition: A | Discharge status: Home / Self Care | Payer: Medicare Other | Source: Ambulatory Visit | Attending: Radiation Oncology | Admitting: Radiation Oncology

## 2015-03-23 ENCOUNTER — Telehealth: Payer: Self-pay | Admitting: Oncology

## 2015-03-23 DIAGNOSIS — C50912 Malignant neoplasm of unspecified site of left female breast: Secondary | ICD-10-CM | POA: Diagnosis not present

## 2015-03-23 NOTE — Telephone Encounter (Signed)
Confirmed appointment for 09/20. Mailed calendar.

## 2015-03-24 ENCOUNTER — Ambulatory Visit
Admission: RE | Admit: 2015-03-24 | Discharge: 2015-03-24 | Disposition: A | Discharge status: Home / Self Care | Payer: Medicare Other | Source: Ambulatory Visit | Attending: Radiation Oncology | Admitting: Radiation Oncology

## 2015-03-24 DIAGNOSIS — C50912 Malignant neoplasm of unspecified site of left female breast: Secondary | ICD-10-CM | POA: Diagnosis not present

## 2015-03-25 ENCOUNTER — Ambulatory Visit
Admission: RE | Admit: 2015-03-25 | Discharge: 2015-03-25 | Disposition: A | Discharge status: Home / Self Care | Payer: Medicare Other | Source: Ambulatory Visit | Attending: Radiation Oncology | Admitting: Radiation Oncology

## 2015-03-25 DIAGNOSIS — C50912 Malignant neoplasm of unspecified site of left female breast: Secondary | ICD-10-CM | POA: Diagnosis not present

## 2015-03-26 ENCOUNTER — Ambulatory Visit
Admission: RE | Admit: 2015-03-26 | Discharge: 2015-03-26 | Disposition: A | Discharge status: Home / Self Care | Payer: Medicare Other | Source: Ambulatory Visit

## 2015-03-26 DIAGNOSIS — C50912 Malignant neoplasm of unspecified site of left female breast: Secondary | ICD-10-CM | POA: Diagnosis not present

## 2015-03-28 ENCOUNTER — Other Ambulatory Visit: Payer: Self-pay | Admitting: Oncology

## 2015-03-29 ENCOUNTER — Other Ambulatory Visit: Payer: Self-pay | Admitting: *Deleted

## 2015-03-29 ENCOUNTER — Ambulatory Visit
Admission: RE | Admit: 2015-03-29 | Discharge: 2015-03-29 | Disposition: A | Discharge status: Home / Self Care | Payer: Medicare Other | Source: Ambulatory Visit | Attending: Radiation Oncology | Admitting: Radiation Oncology

## 2015-03-29 DIAGNOSIS — C50912 Malignant neoplasm of unspecified site of left female breast: Secondary | ICD-10-CM | POA: Diagnosis not present

## 2015-03-29 DIAGNOSIS — N814 Uterovaginal prolapse, unspecified: Secondary | ICD-10-CM

## 2015-03-30 ENCOUNTER — Ambulatory Visit
Admission: RE | Admit: 2015-03-30 | Discharge: 2015-03-30 | Disposition: A | Discharge status: Home / Self Care | Payer: Medicare Other | Source: Ambulatory Visit | Attending: Radiation Oncology | Admitting: Radiation Oncology

## 2015-03-30 DIAGNOSIS — C50912 Malignant neoplasm of unspecified site of left female breast: Secondary | ICD-10-CM | POA: Diagnosis not present

## 2015-03-31 ENCOUNTER — Ambulatory Visit
Admission: RE | Admit: 2015-03-31 | Discharge: 2015-03-31 | Disposition: A | Discharge status: Home / Self Care | Payer: Medicare Other | Source: Ambulatory Visit | Attending: Radiation Oncology | Admitting: Radiation Oncology

## 2015-03-31 DIAGNOSIS — C50912 Malignant neoplasm of unspecified site of left female breast: Secondary | ICD-10-CM | POA: Diagnosis not present

## 2015-04-01 ENCOUNTER — Telehealth: Payer: Self-pay | Admitting: Oncology

## 2015-04-01 ENCOUNTER — Ambulatory Visit
Admission: RE | Admit: 2015-04-01 | Discharge: 2015-04-01 | Disposition: A | Discharge status: Home / Self Care | Payer: Medicare Other | Source: Ambulatory Visit | Attending: Radiation Oncology | Admitting: Radiation Oncology

## 2015-04-01 DIAGNOSIS — C50912 Malignant neoplasm of unspecified site of left female breast: Secondary | ICD-10-CM | POA: Diagnosis not present

## 2015-04-01 NOTE — Telephone Encounter (Signed)
Left message to confirm referral appointment for 05/31 @ 9:30 with Dr.Lavoie.

## 2015-04-02 ENCOUNTER — Ambulatory Visit
Admission: RE | Admit: 2015-04-02 | Discharge: 2015-04-02 | Disposition: A | Discharge status: Home / Self Care | Payer: Medicare Other | Source: Ambulatory Visit | Attending: Radiation Oncology | Admitting: Radiation Oncology

## 2015-04-02 DIAGNOSIS — C50912 Malignant neoplasm of unspecified site of left female breast: Secondary | ICD-10-CM | POA: Diagnosis not present

## 2015-04-05 ENCOUNTER — Inpatient Hospital Stay: Payer: Medicare Other

## 2015-04-06 ENCOUNTER — Inpatient Hospital Stay: Payer: Medicare Other

## 2015-04-06 ENCOUNTER — Ambulatory Visit
Admission: RE | Admit: 2015-04-06 | Discharge: 2015-04-06 | Disposition: A | Discharge status: Home / Self Care | Payer: Medicare Other | Source: Ambulatory Visit | Attending: Radiation Oncology | Admitting: Radiation Oncology

## 2015-04-06 DIAGNOSIS — C50912 Malignant neoplasm of unspecified site of left female breast: Secondary | ICD-10-CM | POA: Diagnosis not present

## 2015-04-06 LAB — CBC
HCT: 39 % (ref 35.0–47.0)
Hemoglobin: 13 g/dL (ref 12.0–16.0)
MCH: 28.9 pg (ref 26.0–34.0)
MCHC: 33.3 g/dL (ref 32.0–36.0)
MCV: 86.9 fL (ref 80.0–100.0)
Platelets: 167 10*3/uL (ref 150–440)
RBC: 4.49 MIL/uL (ref 3.80–5.20)
RDW: 13.5 % (ref 11.5–14.5)
WBC: 4 10*3/uL (ref 3.6–11.0)

## 2015-04-07 ENCOUNTER — Ambulatory Visit
Admission: RE | Admit: 2015-04-07 | Discharge: 2015-04-07 | Disposition: A | Discharge status: Home / Self Care | Payer: Medicare Other | Source: Ambulatory Visit | Attending: Radiation Oncology | Admitting: Radiation Oncology

## 2015-04-07 DIAGNOSIS — C50912 Malignant neoplasm of unspecified site of left female breast: Secondary | ICD-10-CM | POA: Diagnosis not present

## 2015-04-08 ENCOUNTER — Ambulatory Visit
Admission: RE | Admit: 2015-04-08 | Discharge: 2015-04-08 | Disposition: A | Discharge status: Home / Self Care | Payer: Medicare Other | Source: Ambulatory Visit | Attending: Radiation Oncology | Admitting: Radiation Oncology

## 2015-04-08 DIAGNOSIS — C50912 Malignant neoplasm of unspecified site of left female breast: Secondary | ICD-10-CM | POA: Diagnosis not present

## 2015-04-09 ENCOUNTER — Ambulatory Visit
Admission: RE | Admit: 2015-04-09 | Discharge: 2015-04-09 | Disposition: A | Discharge status: Home / Self Care | Payer: Medicare Other | Source: Ambulatory Visit | Attending: Radiation Oncology | Admitting: Radiation Oncology

## 2015-04-09 DIAGNOSIS — C50912 Malignant neoplasm of unspecified site of left female breast: Secondary | ICD-10-CM | POA: Diagnosis not present

## 2015-04-12 ENCOUNTER — Ambulatory Visit
Admission: RE | Admit: 2015-04-12 | Discharge: 2015-04-12 | Disposition: A | Discharge status: Home / Self Care | Payer: Medicare Other | Source: Ambulatory Visit | Attending: Radiation Oncology | Admitting: Radiation Oncology

## 2015-04-12 DIAGNOSIS — C50912 Malignant neoplasm of unspecified site of left female breast: Secondary | ICD-10-CM | POA: Diagnosis not present

## 2015-04-13 ENCOUNTER — Ambulatory Visit
Admission: RE | Admit: 2015-04-13 | Discharge: 2015-04-13 | Disposition: A | Discharge status: Home / Self Care | Payer: Medicare Other | Source: Ambulatory Visit | Attending: Radiation Oncology | Admitting: Radiation Oncology

## 2015-04-13 DIAGNOSIS — C50912 Malignant neoplasm of unspecified site of left female breast: Secondary | ICD-10-CM | POA: Diagnosis not present

## 2015-04-14 ENCOUNTER — Ambulatory Visit
Admission: RE | Admit: 2015-04-14 | Discharge: 2015-04-14 | Disposition: A | Discharge status: Home / Self Care | Payer: Medicare Other | Source: Ambulatory Visit | Attending: Radiation Oncology | Admitting: Radiation Oncology

## 2015-04-14 DIAGNOSIS — C50912 Malignant neoplasm of unspecified site of left female breast: Secondary | ICD-10-CM | POA: Diagnosis not present

## 2015-04-15 ENCOUNTER — Ambulatory Visit
Admission: RE | Admit: 2015-04-15 | Discharge: 2015-04-15 | Disposition: A | Discharge status: Home / Self Care | Payer: Medicare Other | Source: Ambulatory Visit | Attending: Radiation Oncology | Admitting: Radiation Oncology

## 2015-04-15 DIAGNOSIS — C50912 Malignant neoplasm of unspecified site of left female breast: Secondary | ICD-10-CM | POA: Diagnosis not present

## 2015-04-16 ENCOUNTER — Ambulatory Visit
Admission: RE | Admit: 2015-04-16 | Discharge: 2015-04-16 | Disposition: A | Discharge status: Home / Self Care | Payer: Medicare Other | Source: Ambulatory Visit | Attending: Radiation Oncology | Admitting: Radiation Oncology

## 2015-04-16 DIAGNOSIS — C50912 Malignant neoplasm of unspecified site of left female breast: Secondary | ICD-10-CM | POA: Diagnosis not present

## 2015-04-19 ENCOUNTER — Other Ambulatory Visit: Payer: Self-pay | Admitting: *Deleted

## 2015-04-19 ENCOUNTER — Ambulatory Visit
Admission: RE | Admit: 2015-04-19 | Discharge: 2015-04-19 | Disposition: A | Discharge status: Home / Self Care | Payer: Medicare Other | Source: Ambulatory Visit | Attending: Radiation Oncology | Admitting: Radiation Oncology

## 2015-04-19 ENCOUNTER — Inpatient Hospital Stay: Payer: Medicare Other | Attending: Radiation Oncology

## 2015-04-19 DIAGNOSIS — C50912 Malignant neoplasm of unspecified site of left female breast: Secondary | ICD-10-CM

## 2015-04-19 LAB — CBC
HEMATOCRIT: 40 % (ref 35.0–47.0)
HEMOGLOBIN: 13.4 g/dL (ref 12.0–16.0)
MCH: 29.1 pg (ref 26.0–34.0)
MCHC: 33.4 g/dL (ref 32.0–36.0)
MCV: 87.1 fL (ref 80.0–100.0)
Platelets: 170 10*3/uL (ref 150–440)
RBC: 4.6 MIL/uL (ref 3.80–5.20)
RDW: 13.6 % (ref 11.5–14.5)
WBC: 3.9 10*3/uL (ref 3.6–11.0)

## 2015-04-20 ENCOUNTER — Ambulatory Visit
Admission: RE | Admit: 2015-04-20 | Discharge: 2015-04-20 | Disposition: A | Discharge status: Home / Self Care | Payer: Medicare Other | Source: Ambulatory Visit | Attending: Radiation Oncology | Admitting: Radiation Oncology

## 2015-04-20 DIAGNOSIS — C50912 Malignant neoplasm of unspecified site of left female breast: Secondary | ICD-10-CM | POA: Diagnosis not present

## 2015-04-21 ENCOUNTER — Ambulatory Visit
Admission: RE | Admit: 2015-04-21 | Discharge: 2015-04-21 | Disposition: A | Discharge status: Home / Self Care | Payer: Medicare Other | Source: Ambulatory Visit | Attending: Radiation Oncology | Admitting: Radiation Oncology

## 2015-04-21 DIAGNOSIS — C50912 Malignant neoplasm of unspecified site of left female breast: Secondary | ICD-10-CM | POA: Diagnosis not present

## 2015-04-22 ENCOUNTER — Ambulatory Visit
Admission: RE | Admit: 2015-04-22 | Discharge: 2015-04-22 | Disposition: A | Discharge status: Home / Self Care | Payer: Medicare Other | Source: Ambulatory Visit | Attending: Radiation Oncology | Admitting: Radiation Oncology

## 2015-04-22 DIAGNOSIS — C50912 Malignant neoplasm of unspecified site of left female breast: Secondary | ICD-10-CM | POA: Diagnosis not present

## 2015-04-23 ENCOUNTER — Ambulatory Visit
Admission: RE | Admit: 2015-04-23 | Discharge: 2015-04-23 | Disposition: A | Discharge status: Home / Self Care | Payer: Medicare Other | Source: Ambulatory Visit | Attending: Radiation Oncology | Admitting: Radiation Oncology

## 2015-04-23 DIAGNOSIS — C50912 Malignant neoplasm of unspecified site of left female breast: Secondary | ICD-10-CM | POA: Diagnosis not present

## 2015-04-26 ENCOUNTER — Inpatient Hospital Stay
Admission: RE | Admit: 2015-04-26 | Discharge: 2015-04-26 | Disposition: A | Discharge status: Home / Self Care | Payer: Self-pay | Source: Ambulatory Visit | Attending: Radiation Oncology | Admitting: Radiation Oncology

## 2015-04-26 DIAGNOSIS — C50912 Malignant neoplasm of unspecified site of left female breast: Secondary | ICD-10-CM | POA: Diagnosis not present

## 2015-04-27 ENCOUNTER — Inpatient Hospital Stay
Admission: RE | Admit: 2015-04-27 | Discharge: 2015-04-27 | Disposition: A | Discharge status: Home / Self Care | Payer: Self-pay | Source: Ambulatory Visit | Attending: Radiation Oncology | Admitting: Radiation Oncology

## 2015-04-27 ENCOUNTER — Other Ambulatory Visit: Payer: Self-pay | Admitting: *Deleted

## 2015-04-27 DIAGNOSIS — C50912 Malignant neoplasm of unspecified site of left female breast: Secondary | ICD-10-CM | POA: Diagnosis not present

## 2015-04-27 MED ORDER — SILVER SULFADIAZINE 1 % EX CREA: 1.0000 "application " | TOPICAL_CREAM | Freq: Two times a day (BID) | CUTANEOUS | Status: DC

## 2015-05-03 ENCOUNTER — Ambulatory Visit
Admission: RE | Admit: 2015-05-03 | Discharge: 2015-05-03 | Disposition: A | Discharge status: Home / Self Care | Payer: Medicare Other | Source: Ambulatory Visit | Attending: Radiation Oncology | Admitting: Radiation Oncology

## 2015-05-03 DIAGNOSIS — C50912 Malignant neoplasm of unspecified site of left female breast: Secondary | ICD-10-CM | POA: Diagnosis not present

## 2015-05-04 ENCOUNTER — Inpatient Hospital Stay
Admission: RE | Admit: 2015-05-04 | Discharge: 2015-05-04 | Disposition: A | Discharge status: Home / Self Care | Payer: Self-pay | Source: Ambulatory Visit | Attending: Radiation Oncology | Admitting: Radiation Oncology

## 2015-05-04 ENCOUNTER — Ambulatory Visit: Payer: Medicare Other

## 2015-05-04 DIAGNOSIS — C50912 Malignant neoplasm of unspecified site of left female breast: Secondary | ICD-10-CM | POA: Diagnosis not present

## 2015-05-05 ENCOUNTER — Ambulatory Visit
Admission: RE | Admit: 2015-05-05 | Discharge: 2015-05-05 | Disposition: A | Discharge status: Home / Self Care | Payer: Medicare Other | Source: Ambulatory Visit | Attending: Radiation Oncology | Admitting: Radiation Oncology

## 2015-05-05 DIAGNOSIS — C50912 Malignant neoplasm of unspecified site of left female breast: Secondary | ICD-10-CM | POA: Diagnosis not present

## 2015-05-06 ENCOUNTER — Ambulatory Visit
Admission: RE | Admit: 2015-05-06 | Discharge: 2015-05-06 | Disposition: A | Discharge status: Home / Self Care | Payer: Medicare Other | Source: Ambulatory Visit | Attending: Radiation Oncology | Admitting: Radiation Oncology

## 2015-05-06 DIAGNOSIS — C50912 Malignant neoplasm of unspecified site of left female breast: Secondary | ICD-10-CM | POA: Diagnosis not present

## 2015-05-07 ENCOUNTER — Inpatient Hospital Stay
Admission: RE | Admit: 2015-05-07 | Discharge: 2015-05-07 | Disposition: A | Discharge status: Home / Self Care | Payer: Self-pay | Source: Ambulatory Visit | Attending: Radiation Oncology | Admitting: Radiation Oncology

## 2015-05-07 DIAGNOSIS — C50912 Malignant neoplasm of unspecified site of left female breast: Secondary | ICD-10-CM | POA: Diagnosis not present

## 2015-05-11 ENCOUNTER — Other Ambulatory Visit: Payer: Self-pay | Admitting: *Deleted

## 2015-05-11 ENCOUNTER — Ambulatory Visit
Admission: RE | Admit: 2015-05-11 | Discharge: 2015-05-11 | Disposition: A | Discharge status: Home / Self Care | Payer: Medicare Other | Source: Ambulatory Visit | Attending: Radiation Oncology | Admitting: Radiation Oncology

## 2015-05-11 ENCOUNTER — Telehealth: Payer: Self-pay | Admitting: *Deleted

## 2015-05-11 DIAGNOSIS — C50912 Malignant neoplasm of unspecified site of left female breast: Secondary | ICD-10-CM | POA: Diagnosis not present

## 2015-05-11 MED ORDER — TAMOXIFEN CITRATE 20 MG PO TABS: 20.0000 mg | ORAL_TABLET | Freq: Every day | ORAL | Status: DC

## 2015-05-11 NOTE — Telephone Encounter (Signed)
TC from patient stating that she has completed her radiation treatments  as of today 05/11/15. She understands that she is to start her Tamoxifen on 05/20/15 and needs the prescription called in to her pharmacy-[facility] on [address]. In [place].

## 2015-05-11 NOTE — Telephone Encounter (Signed)
Prescription obtained and escribed.

## 2015-06-10 ENCOUNTER — Encounter: Payer: Self-pay | Admitting: Radiation Oncology

## 2015-06-10 ENCOUNTER — Ambulatory Visit
Admission: RE | Admit: 2015-06-10 | Discharge: 2015-06-10 | Disposition: A | Discharge status: Home / Self Care | Payer: Medicare Other | Source: Ambulatory Visit | Attending: Radiation Oncology | Admitting: Radiation Oncology

## 2015-06-10 VITALS — BP 118/73 | HR 58 | Temp 97.2°F | Wt 183.0 lb

## 2015-06-10 DIAGNOSIS — C50912 Malignant neoplasm of unspecified site of left female breast: Secondary | ICD-10-CM

## 2015-06-10 NOTE — Progress Notes (Signed)
Radiation Oncology Follow up Note  Name: Gabriela [name]   Date:   06/10/2015 MRN:  [id number] DOB: Apr 03, 1946    This 69 y.o. female presents to the clinic today for follow-up for breast cancer.  REFERRING PROVIDER: Glendon Axe, MD  HPI: patient is a 69 year old female now 1 month out of in completed whole breast radiation to her left breast for a superficial stage I (T1 BN 0 M0) ER/PR positive HER-2/neu negative invasive mammary carcinoma. She is seen today in routine follow-up and is doing well. She specifically denies breast tenderness cough or bone pain. She's been started on aromatase inhibitor therapy tolerating that well without side effect..  COMPLICATIONS OF TREATMENT: none  FOLLOW UP COMPLIANCE: keeps appointments   PHYSICAL EXAM:  BP 118/73 mmHg  Pulse 58  Temp(Src) 97.2 F (36.2 C)  Wt 182 lb 15.7 oz (83 kg) Lungs are clear to A&P cardiac examination essentially unremarkable with regular rate and rhythm. No dominant mass or nodularity is noted in either breast in 2 positions examined. Incision is well-healed. No axillary or supraclavicular adenopathy is appreciated. Cosmetic result is excellent. Well-developed well-nourished patient in NAD. HEENT reveals PERLA, EOMI, discs not visualized.  Oral cavity is clear. No oral mucosal lesions are identified. Neck is clear without evidence of cervical or supraclavicular adenopathy. Lungs are clear to A&P. Cardiac examination is essentially unremarkable with regular rate and rhythm without murmur rub or thrill. Abdomen is benign with no organomegaly or masses noted. Motor sensory and DTR levels are equal and symmetric in the upper and lower extremities. Cranial nerves II through XII are grossly intact. Proprioception is intact. No peripheral adenopathy or edema is identified. No motor or sensory levels are noted. Crude visual fields are within normal range.   RADIOLOGY RESULTS: no recent films for review  PLAN: present time  she is doing well recovering nicely after 1 month from whole breast radiation. I'm please were overall progress. I have asked to see her back in 4-5 months for follow-up. She continues on aromatase inhibitor therapy. Patient has call sooner with any concerns. She continues follow-up care with medical oncology.  I would like to take this opportunity for allowing me to participate in the care of your patient.Armstead Peaks., MD

## 2015-07-26 ENCOUNTER — Other Ambulatory Visit: Payer: Self-pay

## 2015-07-26 DIAGNOSIS — C50912 Malignant neoplasm of unspecified site of left female breast: Secondary | ICD-10-CM

## 2015-07-27 ENCOUNTER — Telehealth: Payer: Self-pay | Admitting: Oncology

## 2015-07-27 ENCOUNTER — Other Ambulatory Visit (HOSPITAL_BASED_OUTPATIENT_CLINIC_OR_DEPARTMENT_OTHER): Payer: Medicare Other

## 2015-07-27 ENCOUNTER — Ambulatory Visit (HOSPITAL_BASED_OUTPATIENT_CLINIC_OR_DEPARTMENT_OTHER): Payer: Medicare Other | Admitting: Oncology

## 2015-07-27 VITALS — BP 108/52 | HR 54 | Temp 98.3°F | Resp 18 | Ht 68.0 in | Wt 188.1 lb

## 2015-07-27 DIAGNOSIS — C50812 Malignant neoplasm of overlapping sites of left female breast: Secondary | ICD-10-CM

## 2015-07-27 DIAGNOSIS — C50912 Malignant neoplasm of unspecified site of left female breast: Secondary | ICD-10-CM

## 2015-07-27 LAB — COMPREHENSIVE METABOLIC PANEL (CC13)
ALBUMIN: 3.6 g/dL (ref 3.5–5.0)
ALK PHOS: 19 U/L — AB (ref 40–150)
ALT: 13 U/L (ref 0–55)
ANION GAP: 7 meq/L (ref 3–11)
AST: 17 U/L (ref 5–34)
BUN: 19.4 mg/dL (ref 7.0–26.0)
CALCIUM: 9.1 mg/dL (ref 8.4–10.4)
CHLORIDE: 108 meq/L (ref 98–109)
CO2: 29 mEq/L (ref 22–29)
Creatinine: 0.8 mg/dL (ref 0.6–1.1)
EGFR: 72 mL/min/{1.73_m2} — ABNORMAL LOW (ref >90)
Glucose: 131 mg/dl (ref 70–140)
POTASSIUM: 4.1 meq/L (ref 3.5–5.1)
Sodium: 144 mEq/L (ref 136–145)
Total Bilirubin: 0.36 mg/dL (ref 0.20–1.20)
Total Protein: 6.3 g/dL — ABNORMAL LOW (ref 6.4–8.3)

## 2015-07-27 LAB — CBC WITH DIFFERENTIAL/PLATELET
BASO%: 0.3 % (ref 0.0–2.0)
BASOS ABS: 0 10*3/uL (ref 0.0–0.1)
EOS ABS: 0.1 10*3/uL (ref 0.0–0.5)
EOS%: 2.4 % (ref 0.0–7.0)
HEMATOCRIT: 38.5 % (ref 34.8–46.6)
HEMOGLOBIN: 12.8 g/dL (ref 11.6–15.9)
LYMPH#: 0.8 10*3/uL — AB (ref 0.9–3.3)
LYMPH%: 21.2 % (ref 14.0–49.7)
MCH: 29.2 pg (ref 25.1–34.0)
MCHC: 33.2 g/dL (ref 31.5–36.0)
MCV: 87.7 fL (ref 79.5–101.0)
MONO#: 0.3 10*3/uL (ref 0.1–0.9)
MONO%: 9.1 % (ref 0.0–14.0)
NEUT#: 2.5 10*3/uL (ref 1.5–6.5)
NEUT%: 67 % (ref 38.4–76.8)
PLATELETS: 155 10*3/uL (ref 145–400)
RBC: 4.39 10*6/uL (ref 3.70–5.45)
RDW: 12.8 % (ref 11.2–14.5)
WBC: 3.7 10*3/uL — ABNORMAL LOW (ref 3.9–10.3)

## 2015-07-27 NOTE — Progress Notes (Signed)
[place]  [phone] Fax:([phone]) [phone]     ID: Gabriela [name] DOB: November 12, 1945  MR#: [id number]  WCB#:[id number]  Patient Care Team: Glendon Axe, MD as PCP - General (Internal Medicine) Excell Seltzer, MD as Consulting Physician (General Surgery) PCP: Glendon Axe, MD GYN: SU: Excell Seltzer M.D. OTHER MD: Prentiss Bells "[name]" [place], [place]; Berton Mount MD, Dossie Arbour MD  CHIEF COMPLAINT: Estrogen receptor positive breast cancer  CURRENT TREATMENT: Tamoxifen   BREAST CANCER HISTORY: From the original intake note:  The patient herself noted a lump in her left breast, associated with some discomfort. She brought it to her physician's attention and on 01/15/2015 underwent left digital mammography with tomography and ultrasonography at [facility]. This showed breast density to be category A. Compression images were negative but on physical exam there was a firm palpable mobile nodule at the 3:00 position of the left breast. By ultrasound this measured 9 mm maximally. There is no note regarding axillary adenopathy in the mammographic report.  On 01/18/2015 the patient underwent biopsy of the mass in question and this showed ([id number]; [id number]) an invasive mammary carcinoma, measuring 3 mm on the specimen, grade 1, estrogen receptor greater than 90% positive, progesterone receptor between 11 and 50% positive, with no HER-2 amplification  (Immunohistochemistry score 1+).  On 02/03/2015 the patient underwent bilateral breast MRI. This suggested a breast density category B. The right breast was negative and there were no abnormal appearing lymph nodes. In the left breast there was an irregular enhancing mass subareolar early measuring 9 mm.  Her subsequent history is as detailed below  INTERVAL HISTORY: [name] returns today for follow-up of her breast cancer. She completed her radiation treatments the first week in July. She did not  have significant fatigue but she says she had significant redness and draining. The left breast is still a bit discolored, she says, and she is still tender around the nipple area. She has been having trouble finding a bra that is comfortable.--She then started tamoxifen 05/20/2015. She obtains it at a very good price. She reports no symptoms whatsoever related to that. In particular she is not having hot flashes and while she is having some vaginal issues they are related to "everything dropping in my pelvis". She is having to wear a pad at times.(This preceded the start of tamoxifen). She saw Dr. Myra Gianotti at [place] and is considering surgery in December.  REVIEW OF SYSTEMS: Aside from the problems just mentioned, a detailed review of systems today was noncontributory  PAST MEDICAL HISTORY: Past Medical History  Diagnosis Date  . Hyperlipemia   . Diabetes mellitus without complication     diet controlled  . Arthritis   . History of closed head injury 2012    accident work threw her 24f  . Chronic pain     from fall 2012  . Urinary incontinence     wears a pessery    PAST SURGICAL HISTORY: Past Surgical History  Procedure Laterality Date  . Shoulder arthroscopy  2012    left  . Tonsillectomy    . Breast lumpectomy  1999    left  . Colonoscopy    . Radioactive seed guided mastectomy with axillary sentinel lymph node biopsy Left 02/08/2015    Procedure: RADIOACTIVE SEED LOCALIZATION LUMPECTOMY WITH LEFT AXILLARY SENTINEL LYMPH NODE BIOPSY;  Surgeon: BExcell Seltzer MD;  Location: [facility]  Service: General;  Laterality: Left;   FAMILY HISTORY No family history on file. The patient's  father died in an automobile accident at age 22. The patient's mother died at the age of 7 from colon cancer. The patient has been told that her mother was on a medication during her pregnancy that may have caused the colon cancer. The patient's mother had a sister diagnosed with breast and  cervical cancer in her 36s. She also had a brother who was killed in World War II 1. There is no other history of cancer in the family to the patient's knowledge   GYNECOLOGIC HISTORY:  No LMP recorded. Patient is postmenopausal. Menarche age 21, first live birth age 55. The patient is GX P3. She went through the change of life approximately age 57. She did not take hormone replacement. She never used birth control   SOCIAL HISTORY:  The patient and her husband Gabriela [name] are both retired. They live on [place] acres in our quite busy caring for that. At is also Seryna's chief source of exercise. [name] is an occupational Programmer, applications in [place]. [name] is a Cabin crew in [place]. [name] works as a Librarian, academic for the twin [employer] in [place]. The patient has 4 grandchildren. She is a Psychologist, forensic.    ADVANCED DIRECTIVES: In place   HEALTH MAINTENANCE: Social History  Substance Use Topics  . Smoking status: Never Smoker   . Smokeless tobacco: Never Used  . Alcohol Use: No     Colonoscopy:2008/current nodal clinic  PAP:2015  Bone density: 2007  Lipid panel:  Allergies  Allergen Reactions  . Oxycontin [Oxycodone Hcl]   . Sulfa Antibiotics Itching    Current Outpatient Prescriptions  Medication Sig Dispense Refill  . ALPHA LIPOIC ACID PO Take by mouth.    . cholecalciferol (VITAMIN D) 1000 UNITS tablet Take 1,000 Units by mouth daily.    . silver sulfADIAZINE (SILVADENE) 1 % cream Apply 1 application topically 2 (two) times daily. 50 g 2  . tamoxifen (NOLVADEX) 20 MG tablet Take 1 tablet (20 mg total) by mouth daily. 30 tablet 3  . traMADol (ULTRAM) 50 MG tablet      No current facility-administered medications for this visit.    OBJECTIVE: Middle-aged white woman in no acute distress Filed Vitals:   07/27/15 0838  BP: 108/52  Pulse: 54  Temp: 98.3 F (36.8 C)  Resp: 18     Body mass index is 28.61 kg/(m^2).    ECOG FS:0 -  Asymptomatic  Sclerae unicteric, EOMs intact Oropharynx clear, dentition in good repair No cervical or supraclavicular adenopathy Lungs no rales or rhonchi Heart regular rate and rhythm Abd soft, nontender, positive bowel sounds MSK no focal spinal tenderness, no upper extremity lymphedema Neuro: nonfocal, well oriented, appropriate affect Breasts: The right breast is unremarkable. The left breast is status post lumpectomy and radiation. There is minimal hyperpigmentation. There is some skin thickening from the radiation. There is no evidence of local recurrence. The cosmetic result is good. The left axilla is benign.  LAB RESULTS:  CMP     Component Value Date/Time   NA 142 02/01/2015 0832   K 4.1 02/01/2015 0832   CO2 27 02/01/2015 0832   GLUCOSE 119 02/01/2015 0832   BUN 18.7 02/01/2015 0832   CREATININE 0.9 02/01/2015 0832   CALCIUM 8.9 02/01/2015 0832   PROT 6.5 02/01/2015 0832   ALBUMIN 3.8 02/01/2015 0832   AST 24 02/01/2015 0832   ALT 29 02/01/2015 0832   ALKPHOS 27* 02/01/2015 0832   BILITOT 0.50 02/01/2015 0350  INo results found for: SPEP, UPEP  Lab Results  Component Value Date   WBC 3.7* 07/27/2015   NEUTROABS 2.5 07/27/2015   HGB 12.8 07/27/2015   HCT 38.5 07/27/2015   MCV 87.7 07/27/2015   PLT 155 07/27/2015      Chemistry      Component Value Date/Time   NA 142 02/01/2015 0832   K 4.1 02/01/2015 0832   CO2 27 02/01/2015 0832   BUN 18.7 02/01/2015 0832   CREATININE 0.9 02/01/2015 0832      Component Value Date/Time   CALCIUM 8.9 02/01/2015 0832   ALKPHOS 27* 02/01/2015 0832   AST 24 02/01/2015 0832   ALT 29 02/01/2015 0832   BILITOT 0.50 02/01/2015 0832       No results found for: LABCA2  No components found for: LABCA125  No results for input(s): INR in the last 168 hours.  Urinalysis No results found for: COLORURINE, APPEARANCEUR, LABSPEC, PHURINE, GLUCOSEU, HGBUR, BILIRUBINUR, KETONESUR, PROTEINUR, UROBILINOGEN, NITRITE,  LEUKOCYTESUR  STUDIES: No results found.  ASSESSMENT: 69 y.o. [place] woman status post left breast biopsy 01/18/2015 for a clinical T1b N0, stage IA invasive ductal carcinoma, grade 1, estrogen and progesterone receptor positive, HER-2 not amplified  (1) left lumpectomy and sentinel lymph node sampling 02/08/2015 showed a pT1b pN0, stage IA invasive ductal carcinoma, grade 1, with repeat HER-2 again negative. Margins were negative but close  (2) Oncotype DX showed a score of 14, predicting a risk of recurrence outside the breast in the next 10 years of 9% if the patient's only systemic treatment is tamoxifen for 5 years. It also protects no benefit from adjuvant chemotherapy.  (3) adjuvant radiation completed in [facility] 05/11/2015  (4) tamoxifen started 05/20/2015  (5) considering TAH-BSO for urologic reasons  PLAN: Cristie Hem has completed her local treatment for breast cancer and is tolerating her systemic treatment well. She knows she has a good prognosis. At this point the plan is to continue tamoxifen for 5 years and then decide whether she would want to continue it beyond that point or "get out of the cancer business". She is still having discomfort in her left breast. She has ordered a couple of Brian's which have not salt the problem. I gave her a prescription today for second nature and hopefully they will be able to find something she is more comfortable with.  She is having significant pelvic issues being worked up  by Dr. Osie Cheeks area did at this point Kelsy is considering vaginal hysterectomy with salpingo-oophorectomy in December area did she is "afraid of the mesh".  If she sees her breast surgeon in December, she will see [place] again in mid-April, after her next set of mammograms. We will continue to "tag team her" in that fashion for the first 2 years postop, after which we will broaden the follow-up interval.  Chauncey Cruel, MD   07/27/2015 8:48 AM Medical Oncology and  Hematology [facility] [place], [place] [address] Tel. [phone]    Fax. [phone]

## 2015-07-27 NOTE — Telephone Encounter (Signed)
Appointments made and avs printed for patient °

## 2015-08-27 ENCOUNTER — Other Ambulatory Visit: Payer: Self-pay

## 2015-08-27 DIAGNOSIS — C50912 Malignant neoplasm of unspecified site of left female breast: Secondary | ICD-10-CM

## 2015-08-27 MED ORDER — TAMOXIFEN CITRATE 20 MG PO TABS: 20.0000 mg | ORAL_TABLET | Freq: Every day | ORAL | Status: DC

## 2015-08-27 NOTE — Telephone Encounter (Signed)
Refilled patient's tamoxifen 20 mg #30 take one daily with 3 refills.  Faxed to [facility]  [phone].

## 2015-10-25 ENCOUNTER — Other Ambulatory Visit: Payer: Self-pay | Admitting: Oncology

## 2015-12-03 ENCOUNTER — Other Ambulatory Visit: Payer: Self-pay | Admitting: *Deleted

## 2015-12-03 ENCOUNTER — Encounter: Payer: Self-pay | Admitting: Radiation Oncology

## 2015-12-03 ENCOUNTER — Ambulatory Visit
Admission: RE | Admit: 2015-12-03 | Discharge: 2015-12-03 | Disposition: A | Discharge status: Home / Self Care | Payer: Medicare Other | Source: Ambulatory Visit | Attending: Radiation Oncology | Admitting: Radiation Oncology

## 2015-12-03 VITALS — BP 131/82 | HR 76 | Temp 98.5°F | Resp 20 | Wt 191.8 lb

## 2015-12-03 DIAGNOSIS — C50912 Malignant neoplasm of unspecified site of left female breast: Secondary | ICD-10-CM

## 2015-12-03 NOTE — Progress Notes (Signed)
Radiation Oncology Follow up Note  Name: Gabriela [name]   Date:   12/03/2015 MRN:  [id number] DOB: 1946/11/03    This 70 y.o. female presents to the clinic today for follow-up for stage I (T1 BN 0 M0) ER/PR positive HER-2/neu negative invasive mammary carcinoma of the left breast status post whole breast radiation.  REFERRING PROVIDER: Glendon Axe, MD  HPI: Patient is a 70 year old female now out 6 months having completed whole breast radiation to her left breast for stage I (T1 be N0 M0) ER/PR positive HER-2/neu negative invasive mammary carcinoma. Seen today in routine follow-up she is doing well.. She specifically denies breast tenderness cough or bone pain. She has had in the past year a TVH BSO with anterior repair for postmenopausal bleeding. Pathology was benign. She is doing well from that standpoint. She has not had a follow-up mammogram scheduled. She's currently on tamoxifen tolerating that well without side effect.  COMPLICATIONS OF TREATMENT: none  FOLLOW UP COMPLIANCE: keeps appointments   PHYSICAL EXAM:  BP 131/82 mmHg  Pulse 76  Temp(Src) 98.5 F (36.9 C)  Resp 20  Wt 191 lb 12.8 oz (87 kg) Lungs are clear to A&P cardiac examination essentially unremarkable with regular rate and rhythm. No dominant mass or nodularity is noted in either breast in 2 positions examined. Incision is well-healed. No axillary or supraclavicular adenopathy is appreciated. Cosmetic result is excellent. Well-developed well-nourished patient in NAD. HEENT reveals PERLA, EOMI, discs not visualized.  Oral cavity is clear. No oral mucosal lesions are identified. Neck is clear without evidence of cervical or supraclavicular adenopathy. Lungs are clear to A&P. Cardiac examination is essentially unremarkable with regular rate and rhythm without murmur rub or thrill. Abdomen is benign with no organomegaly or masses noted. Motor sensory and DTR levels are equal and symmetric in the upper and lower  extremities. Cranial nerves II through XII are grossly intact. Proprioception is intact. No peripheral adenopathy or edema is identified. No motor or sensory levels are noted. Crude visual fields are within normal range.  RADIOLOGY RESULTS: I have ordered bilateral diagnostic mammograms  PLAN: At the present time she is doing well. I have ordered bilateral diagnostic mammograms in the next couple of months. I have asked to see her back in 6 months for follow-up. She continues on tamoxifen without side effect. She continues close follow-up care with medical oncology and surgeon in [place]. Patient knows to call with any concerns.  I would like to take this opportunity for allowing me to participate in the care of your patient.Armstead Peaks., MD

## 2015-12-06 ENCOUNTER — Other Ambulatory Visit: Payer: Self-pay | Admitting: *Deleted

## 2015-12-07 ENCOUNTER — Telehealth: Payer: Self-pay | Admitting: *Deleted

## 2015-12-07 ENCOUNTER — Other Ambulatory Visit: Payer: Self-pay | Admitting: *Deleted

## 2015-12-07 DIAGNOSIS — C50912 Malignant neoplasm of unspecified site of left female breast: Secondary | ICD-10-CM

## 2015-12-07 NOTE — Telephone Encounter (Signed)
This RN spoke with pt per her call stating concern over mammogram per visit with Dr Baruch Gouty - Rad Onc MD.  " he stated he was surprised that I have not already had a mammogram "  " I told him my insurance only pays for 1 a year but he said this was a priority "  This RN informed pt that some MD prefer to obtain new baseline 6 months post completion of radiation therapy post breast cancer diagnosis. Informed pt Dr Baruch Gouty has ordered a mammogram to be done at [place] states she would prefer to have the mammogram at the [facility] where she had prior diagnostic studies obtained as well as she states she spoke with her insurance provider yesterday and was informed she is allowed 1 mammogram a year  Gabriela [name] stated she is not due to have mammogram until late March and she would like a 3 D mammogram.  This RN informed pt above would be given to MD for order to be entered.  Pt will inform [facility] that she is preferring for above to be done thru Dr Jana Hakim.  No other needs at this time.

## 2015-12-31 ENCOUNTER — Encounter: Payer: Self-pay | Admitting: Oncology

## 2016-02-03 ENCOUNTER — Ambulatory Visit
Admission: RE | Admit: 2016-02-03 | Discharge: 2016-02-03 | Disposition: A | Discharge status: Home / Self Care | Payer: Medicare Other | Source: Ambulatory Visit | Attending: Oncology | Admitting: Oncology

## 2016-02-03 DIAGNOSIS — C50912 Malignant neoplasm of unspecified site of left female breast: Secondary | ICD-10-CM

## 2016-02-11 DIAGNOSIS — I83893 Varicose veins of bilateral lower extremities with other complications: Secondary | ICD-10-CM | POA: Insufficient documentation

## 2016-02-15 ENCOUNTER — Other Ambulatory Visit: Payer: Self-pay | Admitting: *Deleted

## 2016-02-15 DIAGNOSIS — C50912 Malignant neoplasm of unspecified site of left female breast: Secondary | ICD-10-CM

## 2016-02-16 ENCOUNTER — Telehealth: Payer: Self-pay | Admitting: Nurse Practitioner

## 2016-02-16 ENCOUNTER — Encounter: Payer: Self-pay | Admitting: Nurse Practitioner

## 2016-02-16 ENCOUNTER — Other Ambulatory Visit (HOSPITAL_BASED_OUTPATIENT_CLINIC_OR_DEPARTMENT_OTHER): Payer: Medicare Other

## 2016-02-16 ENCOUNTER — Ambulatory Visit (HOSPITAL_BASED_OUTPATIENT_CLINIC_OR_DEPARTMENT_OTHER): Payer: Medicare Other | Admitting: Nurse Practitioner

## 2016-02-16 VITALS — BP 141/70 | HR 54 | Temp 97.7°F | Resp 18 | Ht 68.0 in | Wt 194.7 lb

## 2016-02-16 DIAGNOSIS — C50012 Malignant neoplasm of nipple and areola, left female breast: Secondary | ICD-10-CM

## 2016-02-16 DIAGNOSIS — C50812 Malignant neoplasm of overlapping sites of left female breast: Secondary | ICD-10-CM

## 2016-02-16 DIAGNOSIS — Z170 Estrogen receptor positive status [ER+]: Secondary | ICD-10-CM | POA: Diagnosis not present

## 2016-02-16 DIAGNOSIS — C50912 Malignant neoplasm of unspecified site of left female breast: Secondary | ICD-10-CM

## 2016-02-16 LAB — CBC WITH DIFFERENTIAL/PLATELET
BASO%: 1 % (ref 0.0–2.0)
BASOS ABS: 0 10*3/uL (ref 0.0–0.1)
EOS ABS: 0.1 10*3/uL (ref 0.0–0.5)
EOS%: 1.8 % (ref 0.0–7.0)
HCT: 38.3 % (ref 34.8–46.6)
HEMOGLOBIN: 12.4 g/dL (ref 11.6–15.9)
LYMPH#: 0.9 10*3/uL (ref 0.9–3.3)
LYMPH%: 21.7 % (ref 14.0–49.7)
MCH: 28.2 pg (ref 25.1–34.0)
MCHC: 32.3 g/dL (ref 31.5–36.0)
MCV: 87.2 fL (ref 79.5–101.0)
MONO#: 0.4 10*3/uL (ref 0.1–0.9)
MONO%: 8.8 % (ref 0.0–14.0)
NEUT%: 66.7 % (ref 38.4–76.8)
NEUTROS ABS: 2.9 10*3/uL (ref 1.5–6.5)
PLATELETS: 171 10*3/uL (ref 145–400)
RBC: 4.39 10*6/uL (ref 3.70–5.45)
RDW: 13.5 % (ref 11.2–14.5)
WBC: 4.3 10*3/uL (ref 3.9–10.3)

## 2016-02-16 LAB — COMPREHENSIVE METABOLIC PANEL
ALBUMIN: 3.7 g/dL (ref 3.5–5.0)
ALK PHOS: 20 U/L — AB (ref 40–150)
ALT: 13 U/L (ref 0–55)
ANION GAP: 6 meq/L (ref 3–11)
AST: 20 U/L (ref 5–34)
BILIRUBIN TOTAL: 0.46 mg/dL (ref 0.20–1.20)
BUN: 16.5 mg/dL (ref 7.0–26.0)
CALCIUM: 9.2 mg/dL (ref 8.4–10.4)
CO2: 27 mEq/L (ref 22–29)
Chloride: 108 mEq/L (ref 98–109)
Creatinine: 0.9 mg/dL (ref 0.6–1.1)
EGFR: 68 mL/min/{1.73_m2} — AB (ref >90)
Glucose: 136 mg/dl (ref 70–140)
POTASSIUM: 4.4 meq/L (ref 3.5–5.1)
Sodium: 141 mEq/L (ref 136–145)
TOTAL PROTEIN: 6.4 g/dL (ref 6.4–8.3)

## 2016-02-16 NOTE — Progress Notes (Signed)
[place]  Telephone:([phone] Fax:(336) [phone]    ID: Gabriela [name] DOB: [birth year]  MR#: [id number]  AES#:[id number]  Patient Care Team: Glendon Axe, MD as PCP - General (Internal Medicine) Excell Seltzer, MD as Consulting Physician (General Surgery) PCP: Glendon Axe, MD GYN: SU: Excell Seltzer M.D. OTHER MD: Prentiss Bells "[name]" [place], [place]; Berton Mount MD, Dossie Arbour MD  CHIEF COMPLAINT: Estrogen receptor positive breast cancer  CURRENT TREATMENT: Tamoxifen  BREAST CANCER HISTORY: From the original intake note:  The patient herself noted a lump in her left breast, associated with some discomfort. She brought it to her physician's attention and on 01/15/2015 underwent left digital mammography with tomography and ultrasonography at [facility] regional. This showed breast density to be category A. Compression images were negative but on physical exam there was a firm palpable mobile nodule at the 3:00 position of the left breast. By ultrasound this measured 9 mm maximally. There is no note regarding axillary adenopathy in the mammographic report.  On 01/18/2015 the patient underwent biopsy of the mass in question and this showed ([id number]; [id number]) an invasive mammary carcinoma, measuring 3 mm on the specimen, grade 1, estrogen receptor greater than 90% positive, progesterone receptor between 11 and 50% positive, with no HER-2 amplification  (Immunohistochemistry score 1+).  On 02/03/2015 the patient underwent bilateral breast MRI. This suggested a breast density category B. The right breast was negative and there were no abnormal appearing lymph nodes. In the left breast there was an irregular enhancing mass subareolar early measuring 9 mm.  Her subsequent history is as detailed below  INTERVAL HISTORY: [name] returns today for follow-up of her breast cancer. She has been on tamoxifen since July 2016 and tolerates this drug well with no  side effects that she is aware of. She denies hot flashes or vaginal changes. Since her last visit she has had a TAH-BSO in December. She has recovered well with no complications. She was expecting urinary incontinence or changes but this has not been the case so far.  REVIEW OF SYSTEMS: A detailed review of systems is otherwise entirely negative.   PAST MEDICAL HISTORY: Past Medical History  Diagnosis Date  . Hyperlipemia   . Diabetes mellitus without complication (HCC)     diet controlled  . Arthritis   . History of closed head injury 2012    accident work threw her 45f  . Chronic pain     from fall 2012  . Urinary incontinence     wears a pessery    PAST SURGICAL HISTORY: Past Surgical History  Procedure Laterality Date  . Shoulder arthroscopy  2012    left  . Tonsillectomy    . Breast lumpectomy  1999    left  . Colonoscopy    . Radioactive seed guided mastectomy with axillary sentinel lymph node biopsy Left 02/08/2015    Procedure: RADIOACTIVE SEED LOCALIZATION LUMPECTOMY WITH LEFT AXILLARY SENTINEL LYMPH NODE BIOPSY;  Surgeon: BExcell Seltzer MD;  [facility]  Service: General;  Laterality: Left;  . Abdominal hysterectomy     FAMILY HISTORY No family history on file. The patient's father died in an automobile accident at age 69 The patient's mother died at the age of 310from colon cancer. The patient has been told that her mother was on a medication during her pregnancy that may have caused the colon cancer. The patient's mother had a sister diagnosed with breast and cervical cancer in her 767s She also had a  brother who was killed in World War II 1. There is no other history of cancer in the family to the patient's knowledge   GYNECOLOGIC HISTORY:  No LMP recorded. Patient is postmenopausal. Menarche age 70, first live birth age 21. The patient is GX P3. She went through the change of life approximately age 27. She did not take hormone  replacement. She never used birth control   SOCIAL HISTORY:  The patient and her husband Dominica Severin are both retired. They live on 81 acres in our quite busy caring for that. At is also Aja's chief source of exercise. Daughter Jamesetta So is an occupational Programmer, applications in [place]. Daughter Rolly Salter is a Cabin crew in [place]. Son Paizlie Klaus works as a Librarian, academic for the twin [employer] in [place]. The patient has 4 grandchildren. She is a Psychologist, forensic.    ADVANCED DIRECTIVES: In place   HEALTH MAINTENANCE: Social History  Substance Use Topics  . Smoking status: Never Smoker   . Smokeless tobacco: Never Used  . Alcohol Use: No     Colonoscopy:2008/current nodal clinic  PAP:2015  Bone density: 2007  Lipid panel:  Allergies  Allergen Reactions  . Oxycontin [Oxycodone Hcl]   . Sulfa Antibiotics Itching    Current Outpatient Prescriptions  Medication Sig Dispense Refill  . ALPHA LIPOIC ACID PO Take by mouth. Reported on 12/03/2015    . cetirizine (ZYRTEC) 10 MG tablet Take 10 mg by mouth.    . Cholecalciferol (VITAMIN D) 2000 units tablet Take by mouth.    . docusate sodium (COLACE) 100 MG capsule Take 100 mg by mouth.    Marland Kitchen ibuprofen (ADVIL,MOTRIN) 600 MG tablet TK 1 T PO Q 6 H PRF MODERATE PAIN  2  . Omega-3 1000 MG CAPS Take by mouth.    . tamoxifen (NOLVADEX) 20 MG tablet Take 1 tablet by mouth  daily 90 tablet 3  . traMADol (ULTRAM) 50 MG tablet Reported on 12/03/2015     No current facility-administered medications for this visit.    OBJECTIVE: Middle-aged white woman in no acute distress Filed Vitals:   02/16/16 0940  BP: 141/70  Pulse: 54  Temp: 97.7 F (36.5 C)  Resp: 18     Body mass index is 29.61 kg/(m^2).    ECOG FS:0 - Asymptomatic  Sclerae unicteric, EOMs intact Oropharynx clear, dentition in good repair No cervical or supraclavicular adenopathy Lungs no rales or rhonchi Heart regular rate and rhythm Abd soft, nontender, positive bowel  sounds MSK no focal spinal tenderness, no upper extremity lymphedema Neuro: nonfocal, well oriented, appropriate affect Breasts: The right breast is unremarkable. The left breast is status post lumpectomy and radiation. There is minimal hyperpigmentation. There is some skin thickening from the radiation. There is no evidence of local recurrence. The cosmetic result is good. The left axilla is benign.  Skin: warm, dry  HEENT: sclerae anicteric, conjunctivae pink, oropharynx clear. No thrush or mucositis.  Lymph Nodes: No cervical or supraclavicular lymphadenopathy  Lungs: clear to auscultation bilaterally, no rales, wheezes, or rhonci  Heart: regular rate and rhythm  Abdomen: round, soft, non tender, positive bowel sounds  Musculoskeletal: No focal spinal tenderness, no peripheral edema  Neuro: non focal, well oriented, positive affect  Breasts; left breast status post lumpectomy and radiation. l  LAB RESULTS:  CMP     Component Value Date/Time   NA 141 02/16/2016 0922   K 4.4 02/16/2016 0922   CO2 27 02/16/2016 0922   GLUCOSE 136 02/16/2016  0922   BUN 16.5 02/16/2016 0922   CREATININE 0.9 02/16/2016 0922   CALCIUM 9.2 02/16/2016 0922   PROT 6.4 02/16/2016 0922   ALBUMIN 3.7 02/16/2016 0922   AST 20 02/16/2016 0922   ALT 13 02/16/2016 0922   ALKPHOS 20* 02/16/2016 0922   BILITOT 0.46 02/16/2016 0922    INo results found for: SPEP, UPEP  Lab Results  Component Value Date   WBC 4.3 02/16/2016   NEUTROABS 2.9 02/16/2016   HGB 12.4 02/16/2016   HCT 38.3 02/16/2016   MCV 87.2 02/16/2016   PLT 171 02/16/2016      Chemistry      Component Value Date/Time   NA 141 02/16/2016 0922   K 4.4 02/16/2016 0922   CO2 27 02/16/2016 0922   BUN 16.5 02/16/2016 0922   CREATININE 0.9 02/16/2016 0922      Component Value Date/Time   CALCIUM 9.2 02/16/2016 0922   ALKPHOS 20* 02/16/2016 0922   AST 20 02/16/2016 0922   ALT 13 02/16/2016 0922   BILITOT 0.46 02/16/2016 0922        No results found for: LABCA2  No components found for: LABCA125  No results for input(s): INR in the last 168 hours.  Urinalysis No results found for: COLORURINE, APPEARANCEUR, LABSPEC, [place], GLUCOSEU, HGBUR, BILIRUBINUR, [place], [place], UROBILINOGEN, NITRITE, LEUKOCYTESUR  STUDIES: Mm Diag Breast Tomo Bilateral  02/03/2016  CLINICAL DATA:  Malignant lumpectomy of the subareolar left breast in April, 2016, with adjuvant radiation therapy. Initial annual followup evaluation. EXAM: 2D DIGITAL DIAGNOSTIC BILATERAL MAMMOGRAM WITH CAD AND ADJUNCT TOMO COMPARISON:  02/08/2015 (Left), 02/04/2015 (left), 02/02/2015 (right), 01/18/2015 (left), 01/15/2015 (left), 04/13/2014 (bilateral). ACR Breast Density Category b: There are scattered areas of fibroglandular density. FINDINGS: Standard and tomosynthesis CC and MLO views of both breasts and a spot magnification tangential view of the lumpectomy site in the left breast were obtained. Post lumpectomy scarring in the subareolar left breast with surgical clips in place. Mild post radiation skin thickening and trabecular thickening involving the left breast. No suspicious findings in the left breast. No findings suspicious for malignancy in the right breast. Mammographic images were processed with CAD. IMPRESSION: No mammographic evidence of malignancy involving either breast. Expected post lumpectomy and post radiation changes in the left breast. RECOMMENDATION: Bilateral diagnostic mammography in 1 year. I have discussed the findings and recommendations with the patient. Results were also provided in writing at the conclusion of the visit. If applicable, a reminder letter will be sent to the patient regarding the next appointment. BI-RADS CATEGORY  2: Benign. Electronically Signed   By: Evangeline Dakin M.D.   On: 02/03/2016 14:32    ASSESSMENT: 70 y.o. [place] woman status post left breast biopsy 01/18/2015 for a clinical T1b N0, stage IA  invasive ductal carcinoma, grade 1, estrogen and progesterone receptor positive, HER-2 not amplified  (1) left lumpectomy and sentinel lymph node sampling 02/08/2015 showed a pT1b pN0, stage IA invasive ductal carcinoma, grade 1, with repeat HER-2 again negative. Margins were negative but close  (2) Oncotype DX showed a score of 14, predicting a risk of recurrence outside the breast in the next 10 years of 9% if the patient's only systemic treatment is tamoxifen for 5 years. It also protects no benefit from adjuvant chemotherapy.  (3) adjuvant radiation completed in [facility] 05/11/2015  (4) tamoxifen started 05/20/2015  (5) TAH-BSO in December 2016. Benign pathology.  PLAN: Jilliam looks and feels well today. The labs were reviewed in detail and  were stable. She is tolerating the tamoxifen well and will continue this for at least 2 years of antiestrogen therapy. Her most recent mammogram was negative. Originally she did not put much stock into the mammogram results because her original mammogram "missed" her tumor. She was comforted to hear that she is getting 3D mammograms now which are more sensitive. She is still beholden to the idea of a breast MRI in the future, which we may be able to accommodate in a biennial fashion if her insurance provider was agreeable.   Alauna will return in 6 months for a follow up visit with Dr. Jana Hakim. Some time in the next 3 months she anticipates a follow up visit with her surgeon. She understands and agrees with this plan. She knows the goal of treatment in her case is cure. She has been encouraged to call with any issues that might arise before her next visit here.   Laurie Panda, NP   02/16/2016 10:33 AM

## 2016-02-16 NOTE — Telephone Encounter (Signed)
appt made and avs printed °

## 2016-06-09 ENCOUNTER — Ambulatory Visit
Admission: RE | Admit: 2016-06-09 | Discharge: 2016-06-09 | Disposition: A | Discharge status: Home / Self Care | Payer: Medicare Other | Source: Ambulatory Visit | Attending: Radiation Oncology | Admitting: Radiation Oncology

## 2016-06-09 ENCOUNTER — Encounter: Payer: Self-pay | Admitting: Radiation Oncology

## 2016-06-09 ENCOUNTER — Encounter (INDEPENDENT_AMBULATORY_CARE_PROVIDER_SITE_OTHER): Payer: Self-pay

## 2016-06-09 VITALS — BP 112/70 | HR 61 | Temp 98.4°F | Wt 193.0 lb

## 2016-06-09 DIAGNOSIS — C50912 Malignant neoplasm of unspecified site of left female breast: Secondary | ICD-10-CM | POA: Insufficient documentation

## 2016-06-09 DIAGNOSIS — Z79810 Long term (current) use of selective estrogen receptor modulators (SERMs): Secondary | ICD-10-CM | POA: Insufficient documentation

## 2016-06-09 DIAGNOSIS — Z170 Estrogen receptor positive status [ER+]: Secondary | ICD-10-CM | POA: Insufficient documentation

## 2016-06-09 DIAGNOSIS — Z923 Personal history of irradiation: Secondary | ICD-10-CM | POA: Insufficient documentation

## 2016-06-09 DIAGNOSIS — Z90710 Acquired absence of both cervix and uterus: Secondary | ICD-10-CM | POA: Diagnosis not present

## 2016-06-09 DIAGNOSIS — Z90722 Acquired absence of ovaries, bilateral: Secondary | ICD-10-CM | POA: Diagnosis not present

## 2016-06-09 NOTE — Progress Notes (Signed)
Radiation Oncology Follow up Note  Name: Gabriela [name]   Date:   06/09/2016 MRN:  [id number] DOB: July 29, 1946    This 70 y.o. female presents to the clinic today for follow-up for stage I ER/PR positive breast cancer status post whole breast radiation now 1 year out.  REFERRING PROVIDER: Excell Seltzer, MD  HPI: Patient is a 70 year old female now 1 year out having completed whole breast radiation to her left breast for stage I 1 (T1 BN 0 M0) ER/PR positive HER-2/neu negative invasive mammary carcinoma status post wide local excision and sentinel node biopsy. She still slightly fatigued from her TAH/BSO close to year ago although recovering nicely from that pathology again was benign.. She had mammographic back in March 2017 which were benign and is scheduled for follow-up next March. She is currently on tamoxifen tongue that well without side effect.  COMPLICATIONS OF TREATMENT: none  FOLLOW UP COMPLIANCE: keeps appointments   PHYSICAL EXAM:  BP 112/70   Pulse 61   Temp 98.4 F (36.9 C)   Wt 193 lb 0.2 oz (87.5 kg)   BMI 29.35 kg/m  Lungs are clear to A&P cardiac examination essentially unremarkable with regular rate and rhythm. No dominant mass or nodularity is noted in either breast in 2 positions examined. Incision is well-healed. No axillary or supraclavicular adenopathy is appreciated. Cosmetic result is excellent. Well-developed well-nourished patient in NAD. HEENT reveals PERLA, EOMI, discs not visualized.  Oral cavity is clear. No oral mucosal lesions are identified. Neck is clear without evidence of cervical or supraclavicular adenopathy. Lungs are clear to A&P. Cardiac examination is essentially unremarkable with regular rate and rhythm without murmur rub or thrill. Abdomen is benign with no organomegaly or masses noted. Motor sensory and DTR levels are equal and symmetric in the upper and lower extremities. Cranial nerves II through XII are grossly intact.  Proprioception is intact. No peripheral adenopathy or edema is identified. No motor or sensory levels are noted. Crude visual fields are within normal range.  RADIOLOGY RESULTS: Most recent mammograms are reviewed and are benign  PLAN: Patient is doing well now 1 year out from whole breast radiation. I'm please were overall progress. I've asked to see her back in 1 year for follow-up. She continues on tamoxifen therapy. She or he has follow-up mammogram scheduled. Patient knows to call with any concerns.  I would like to take this opportunity to thank you for allowing me to participate in the care of your patient.Armstead Peaks., MD

## 2016-07-18 ENCOUNTER — Other Ambulatory Visit: Payer: Self-pay

## 2016-07-18 DIAGNOSIS — C50912 Malignant neoplasm of unspecified site of left female breast: Secondary | ICD-10-CM

## 2016-07-19 ENCOUNTER — Other Ambulatory Visit (HOSPITAL_BASED_OUTPATIENT_CLINIC_OR_DEPARTMENT_OTHER): Payer: Medicare Other

## 2016-07-19 ENCOUNTER — Telehealth: Payer: Self-pay | Admitting: Oncology

## 2016-07-19 ENCOUNTER — Ambulatory Visit (HOSPITAL_BASED_OUTPATIENT_CLINIC_OR_DEPARTMENT_OTHER): Payer: Medicare Other | Admitting: Oncology

## 2016-07-19 DIAGNOSIS — C50412 Malignant neoplasm of upper-outer quadrant of left female breast: Secondary | ICD-10-CM

## 2016-07-19 DIAGNOSIS — C50912 Malignant neoplasm of unspecified site of left female breast: Secondary | ICD-10-CM

## 2016-07-19 DIAGNOSIS — Z853 Personal history of malignant neoplasm of breast: Secondary | ICD-10-CM | POA: Insufficient documentation

## 2016-07-19 DIAGNOSIS — Z170 Estrogen receptor positive status [ER+]: Secondary | ICD-10-CM | POA: Diagnosis not present

## 2016-07-19 LAB — CBC WITH DIFFERENTIAL/PLATELET
BASO%: 0.7 % (ref 0.0–2.0)
BASOS ABS: 0 10*3/uL (ref 0.0–0.1)
EOS ABS: 0.1 10*3/uL (ref 0.0–0.5)
EOS%: 1.7 % (ref 0.0–7.0)
HCT: 38.4 % (ref 34.8–46.6)
HGB: 12.6 g/dL (ref 11.6–15.9)
LYMPH%: 22.3 % (ref 14.0–49.7)
MCH: 29 pg (ref 25.1–34.0)
MCHC: 32.9 g/dL (ref 31.5–36.0)
MCV: 88.4 fL (ref 79.5–101.0)
MONO#: 0.4 10*3/uL (ref 0.1–0.9)
MONO%: 8 % (ref 0.0–14.0)
NEUT#: 3.1 10*3/uL (ref 1.5–6.5)
NEUT%: 67.3 % (ref 38.4–76.8)
Platelets: 166 10*3/uL (ref 145–400)
RBC: 4.35 10*6/uL (ref 3.70–5.45)
RDW: 13.3 % (ref 11.2–14.5)
WBC: 4.7 10*3/uL (ref 3.9–10.3)
lymph#: 1 10*3/uL (ref 0.9–3.3)

## 2016-07-19 LAB — COMPREHENSIVE METABOLIC PANEL
ALBUMIN: 3.6 g/dL (ref 3.5–5.0)
ALK PHOS: 21 U/L — AB (ref 40–150)
ALT: 16 U/L (ref 0–55)
ANION GAP: 8 meq/L (ref 3–11)
AST: 22 U/L (ref 5–34)
BUN: 19.1 mg/dL (ref 7.0–26.0)
CO2: 28 meq/L (ref 22–29)
Calcium: 8.9 mg/dL (ref 8.4–10.4)
Chloride: 107 mEq/L (ref 98–109)
Creatinine: 0.9 mg/dL (ref 0.6–1.1)
EGFR: 68 mL/min/{1.73_m2} — AB (ref >90)
GLUCOSE: 126 mg/dL (ref 70–140)
POTASSIUM: 4.4 meq/L (ref 3.5–5.1)
SODIUM: 142 meq/L (ref 136–145)
Total Bilirubin: 0.55 mg/dL (ref 0.20–1.20)
Total Protein: 6.6 g/dL (ref 6.4–8.3)

## 2016-07-19 NOTE — Progress Notes (Signed)
Gabriela [name]  Telephone:(336) [phone] Fax:(336) [phone]    ID: Gabriela [name] DOB: [birth year]  MR#: [id number]  PPI#:[id number]  Patient Care Team: Glendon Axe, MD as PCP - General (Internal Medicine) Excell Seltzer, MD as Consulting Physician (General Surgery) PCP: Glendon Axe, MD GYN: SU: Excell Seltzer M.D. OTHER MD: Prentiss Bells "[name]" [place], [place]; Berton Mount MD, Dossie Arbour MD  CHIEF COMPLAINT: Estrogen receptor positive breast cancer  CURRENT TREATMENT: Tamoxifen  BREAST CANCER HISTORY: From the original intake note:  The patient herself noted a lump in her left breast, associated with some discomfort. She brought it to her physician's attention and on 01/15/2015 underwent left digital mammography with tomography and ultrasonography at [facility] regional. This showed breast density to be category A. Compression images were negative but on physical exam there was a firm palpable mobile nodule at the 3:00 position of the left breast. By ultrasound this measured 9 mm maximally. There is no note regarding axillary adenopathy in the mammographic report.  On 01/18/2015 the patient underwent biopsy of the mass in question and this showed ([id number]; [id number]) an invasive mammary carcinoma, measuring 3 mm on the specimen, grade 1, estrogen receptor greater than 90% positive, progesterone receptor between 11 and 50% positive, with no HER-2 amplification  (Immunohistochemistry score 1+).  On 02/03/2015 the patient underwent bilateral breast MRI. This suggested a breast density category B. The right breast was negative and there were no abnormal appearing lymph nodes. In the left breast there was an irregular enhancing mass subareolar early measuring 9 mm.  Her subsequent history is as detailed below  INTERVAL HISTORY: Gabriela [name] returns today for follow-up of her estrogen receptor positive breast cancer. She continues on tamoxifen. [name] far she has not noted  any side effects. She is obtaining the drug at no cost   REVIEW OF SYSTEMS: She tells me that since she had her surgery in December her blood sugars have been higher. She has not changed her diet. She is walking 2 miles a day. Aside from this a detailed review of systems today was stable.  PAST MEDICAL HISTORY: Past Medical History:  Diagnosis Date  . Arthritis   . Chronic pain    from fall 2012  . Diabetes mellitus without complication (HCC)    diet controlled  . History of closed head injury 2012   accident work threw her 37f  . Hyperlipemia   . Urinary incontinence    wears a pessery    PAST SURGICAL HISTORY: Past Surgical History:  Procedure Laterality Date  . ABDOMINAL HYSTERECTOMY    . BREAST LUMPECTOMY  1999   left  . COLONOSCOPY    . RADIOACTIVE SEED GUIDED MASTECTOMY WITH AXILLARY SENTINEL LYMPH NODE BIOPSY Left 02/08/2015   Procedure: RADIOACTIVE SEED LOCALIZATION LUMPECTOMY WITH LEFT AXILLARY SENTINEL LYMPH NODE BIOPSY;  Surgeon: BExcell Seltzer MD;  [facility]  Service: General;  Laterality: Left;  . SHOULDER ARTHROSCOPY  2012   left  . TONSILLECTOMY     FAMILY HISTORY No family history on file. The patient's father died in an automobile accident at age 69 The patient's mother died at the age of 351from colon cancer. The patient has been told that her mother was on a medication during her pregnancy that may have caused the colon cancer. The patient's mother had a sister diagnosed with breast and cervical cancer in her 750s She also had a brother who was killed in World War II 1. There is no other history  of cancer in the family to the patient's knowledge   GYNECOLOGIC HISTORY:  No LMP recorded. Patient has had a hysterectomy. Menarche age 57, first live birth age 51. The patient is GX P3. She went through the change of life approximately age 3. She did not take hormone replacement. She never used birth control   SOCIAL HISTORY:  The  patient and her husband Gabriela [name] are both retired. They live on 39 acres in our quite busy caring for that. At is also Janya's chief source of exercise. [name] is an occupational Programmer, applications in [place]. [name] is a Cabin crew in [place]. [name] works as a Librarian, academic for the twin [employer] in [place]. The patient has 4 grandchildren. She is a Psychologist, forensic.    ADVANCED DIRECTIVES: In place   HEALTH MAINTENANCE: Social History  Substance Use Topics  . Smoking status: Never Smoker  . Smokeless tobacco: Never Used  . Alcohol use No     Colonoscopy:2008/current nodal clinic  PAP:2015  Bone density: 2007  Lipid panel:  Allergies  Allergen Reactions  . Oxycontin [Oxycodone Hcl]   . Sulfa Antibiotics Itching    Current Outpatient Prescriptions  Medication Sig Dispense Refill  . ALPHA LIPOIC ACID PO Take by mouth. Reported on 12/03/2015    . cetirizine (ZYRTEC) 10 MG tablet Take 10 mg by mouth.    . Cholecalciferol (VITAMIN D) 2000 units tablet Take by mouth.    . docusate sodium (COLACE) 100 MG capsule Take 100 mg by mouth.    Marland Kitchen glucose blood (ONE TOUCH ULTRA TEST) test strip Use once daily. Dx code: E11.9 Onr touch Ultra blue    . ibuprofen (ADVIL,MOTRIN) 600 MG tablet TK 1 T PO Q 6 H PRF MODERATE PAIN  2  . Omega-3 1000 MG CAPS Take by mouth.    . tamoxifen (NOLVADEX) 20 MG tablet Take 1 tablet by mouth  daily 90 tablet 3  . traMADol (ULTRAM) 50 MG tablet Reported on 12/03/2015     No current facility-administered medications for this visit.     OBJECTIVE: Middle-aged white woman Who appears well Vitals:   07/19/16 1119  BP: 128/62  Pulse: (!) 53  Resp: 18  Temp: 98.2 F (36.8 C)     Body mass index is 30.04 kg/m.    ECOG FS:0 - Asymptomatic  Sclerae unicteric, pupils round and equal Oropharynx clear and moist-- no thrush or other lesions No cervical or supraclavicular adenopathy Lungs no rales or rhonchi Heart regular rate  and rhythm Abd soft, nontender, positive bowel sounds MSK no focal spinal tenderness, no upper extremity lymphedema Neuro: nonfocal, well oriented, appropriate affect Breasts: The right breast is benign. The left breast is status post lumpectomy and radiation. There is no evidence of local recurrence. The left axilla is benign.   LAB RESULTS:  CMP     Component Value Date/Time   NA 141 02/16/2016 0922   K 4.4 02/16/2016 0922   CO2 27 02/16/2016 0922   GLUCOSE 136 02/16/2016 0922   BUN 16.5 02/16/2016 0922   CREATININE 0.9 02/16/2016 0922   CALCIUM 9.2 02/16/2016 0922   PROT 6.4 02/16/2016 0922   ALBUMIN 3.7 02/16/2016 0922   AST 20 02/16/2016 0922   ALT 13 02/16/2016 0922   ALKPHOS 20 (L) 02/16/2016 0922   BILITOT 0.46 02/16/2016 0922    INo results found for: SPEP, UPEP  Lab Results  Component Value Date   WBC 4.7 07/19/2016  NEUTROABS 3.1 07/19/2016   HGB 12.6 07/19/2016   HCT 38.4 07/19/2016   MCV 88.4 07/19/2016   PLT 166 07/19/2016      Chemistry      Component Value Date/Time   NA 141 02/16/2016 0922   K 4.4 02/16/2016 0922   CO2 27 02/16/2016 0922   BUN 16.5 02/16/2016 0922   CREATININE 0.9 02/16/2016 0922      Component Value Date/Time   CALCIUM 9.2 02/16/2016 0922   ALKPHOS 20 (L) 02/16/2016 0922   AST 20 02/16/2016 0922   ALT 13 02/16/2016 0922   BILITOT 0.46 02/16/2016 0922       No results found for: LABCA2  No components found for: LABCA125  No results for input(s): INR in the last 168 hours.  Urinalysis No results found for: COLORURINE, APPEARANCEUR, LABSPEC, PHURINE, GLUCOSEU, HGBUR, BILIRUBINUR, KETONESUR, PROTEINUR, UROBILINOGEN, NITRITE, LEUKOCYTESUR  STUDIES: No results found.  ASSESSMENT: 70 y.o. [place] woman status post left breast upper outer quadrant biopsy 01/18/2015 for a clinical T1b N0, stage IA invasive ductal carcinoma, grade 1, estrogen and progesterone receptor positive, HER-2 not amplified  (1) left  lumpectomy and sentinel lymph node sampling 02/08/2015 showed a pT1b pN0, stage IA invasive ductal carcinoma, grade 1, with repeat HER-2 again negative. Margins were negative but close  (2) Oncotype DX showed a score of 14, predicting a risk of recurrence outside the breast in the next 10 years of 9% if the patient's only systemic treatment is tamoxifen for 5 years. It also protects no benefit from adjuvant chemotherapy.  (3) adjuvant radiation completed in [facility] 05/11/2015  (4) tamoxifen started 05/20/2015  (5) TAH-BSO in December 2016. Benign pathology.  PLAN: Ellese is tolerating the tamoxifen extremely well and obtaining it at a very good price. The plan will be to continue that for a minimum of 5 years.  I don't have an explanation why her blood sugar was a little bit higher than before. She is already exercising in her diet seems fair. She will be discussing this further with her primary care physician.  Otherwise she will have mammography with tomography in March. She will see me again in April. From that point we will start seeing her on a once a year basis  Gabriela [name] C, MD   07/19/2016 11:26 AM

## 2016-07-19 NOTE — Telephone Encounter (Signed)
appt made and avs printed °

## 2016-08-14 ENCOUNTER — Other Ambulatory Visit: Payer: Self-pay | Admitting: Oncology

## 2016-08-15 DIAGNOSIS — M5417 Radiculopathy, lumbosacral region: Secondary | ICD-10-CM | POA: Insufficient documentation

## 2016-09-26 ENCOUNTER — Ambulatory Visit (INDEPENDENT_AMBULATORY_CARE_PROVIDER_SITE_OTHER): Payer: Medicare Other | Admitting: Internal Medicine

## 2016-09-26 ENCOUNTER — Encounter: Payer: Self-pay | Admitting: Internal Medicine

## 2016-09-26 VITALS — BP 128/84 | HR 57 | Ht 68.0 in | Wt 196.0 lb

## 2016-09-26 DIAGNOSIS — E119 Type 2 diabetes mellitus without complications: Secondary | ICD-10-CM

## 2016-09-26 MED ORDER — METFORMIN HCL 500 MG PO TABS: 500.0000 mg | ORAL_TABLET | Freq: Two times a day (BID) | ORAL | 3 refills | Status: DC

## 2016-09-26 NOTE — Patient Instructions (Signed)
Please start Metformin 500 mg with dinner x 3 days, then increase to 500 mg 2x a day, with meals.  Check sugars 1x a day, rotating checktimes.  Please let me know if the sugars are consistently <80 or >180.  Please return in 3 months with your sugar log.   PATIENT INSTRUCTIONS FOR TYPE 2 DIABETES:  **Please join MyChart!** - see attached instructions about how to join if you have not done so already.  DIET AND EXERCISE Diet and exercise is an important part of diabetic treatment.  We recommended aerobic exercise in the form of brisk walking (working between 40-60% of maximal aerobic capacity, similar to brisk walking) for 150 minutes per week (such as 30 minutes five days per week) along with 3 times per week performing 'resistance' training (using various gauge rubber tubes with handles) 5-10 exercises involving the major muscle groups (upper body, lower body and core) performing 10-15 repetitions (or near fatigue) each exercise. Start at half the above goal but build slowly to reach the above goals. If limited by weight, joint pain, or disability, we recommend daily walking in a swimming pool with water up to waist to reduce pressure from joints while allow for adequate exercise.    BLOOD GLUCOSES Monitoring your blood glucoses is important for continued management of your diabetes. Please check your blood glucoses 2-4 times a day: fasting, before meals and at bedtime (you can rotate these measurements - e.g. one day check before the 3 meals, the next day check before 2 of the meals and before bedtime, etc.).   HYPOGLYCEMIA (low blood sugar) Hypoglycemia is usually a reaction to not eating, exercising, or taking too much insulin/ other diabetes drugs.  Symptoms include tremors, sweating, hunger, confusion, headache, etc. Treat IMMEDIATELY with 15 grams of Carbs: . 4 glucose tablets .  cup regular juice/soda . 2 tablespoons raisins . 4 teaspoons sugar . 1 tablespoon honey Recheck blood  glucose in 15 mins and repeat above if still symptomatic/blood glucose <100.  RECOMMENDATIONS TO REDUCE YOUR RISK OF DIABETIC COMPLICATIONS: * Take your prescribed MEDICATION(S) * Follow a DIABETIC diet: Complex carbs, fiber rich foods, (monounsaturated and polyunsaturated) fats * AVOID saturated/trans fats, high fat foods, >2,300 mg salt per day. * EXERCISE at least 5 times a week for 30 minutes or preferably daily.  * DO NOT SMOKE OR DRINK more than 1 drink a day. * Check your FEET every day. Do not wear tightfitting shoes. Contact us if you develop an ulcer * See your EYE doctor once a year or more if needed * Get a FLU shot once a year * Get a PNEUMONIA vaccine once before and once after age 2 years  GOALS:  * Your Hemoglobin A1c of <7%  * fasting sugars need to be <130 * after meals sugars need to be <180 (2h after you start eating) * Your Systolic BP should be [id number] or lower  * Your Diastolic BP should be 80 or lower  * Your HDL (Good Cholesterol) should be 40 or higher  * Your LDL (Bad Cholesterol) should be 100 or lower. * Your Triglycerides should be 150 or lower  * Your Urine microalbumin (kidney function) should be <30 * Your Body Mass Index should be 25 or lower    Please consider the following ways to cut down carbs and fat and increase fiber and micronutrients in your diet: - substitute whole grain for white bread or pasta - substitute brown rice for white rice - substitute  90-calorie flat bread pieces for slices of bread when possible - substitute sweet potatoes or yams for white potatoes - substitute humus for margarine - substitute tofu for cheese when possible - substitute almond or rice milk for regular milk (would not drink soy milk daily due to concern for soy estrogen influence on breast cancer risk) - substitute dark chocolate for other sweets when possible - substitute water - can add lemon or orange slices for taste - for diet sodas (artificial sweeteners  will trick your body that you can eat sweets without getting calories and will lead you to overeating and weight gain in the long run) - do not skip breakfast or other meals (this will slow down the metabolism and will result in more weight gain over time)  - can try smoothies made from fruit and almond/rice milk in am instead of regular breakfast - can also try old-fashioned (not instant) oatmeal made with almond/rice milk in am - order the dressing on the side when eating salad at a restaurant (pour less than half of the dressing on the salad) - eat as little meat as possible - can try juicing, but should not forget that juicing will get rid of the fiber, so would alternate with eating raw veg./fruits or drinking smoothies - use as little oil as possible, even when using olive oil - can dress a salad with a mix of balsamic vinegar and lemon juice, for e.g. - use agave nectar, stevia sugar, or regular sugar rather than artificial sweateners - steam or broil/roast veggies  - snack on veggies/fruit/nuts (unsalted, preferably) when possible, rather than processed foods - reduce or eliminate aspartame in diet (it is in diet sodas, chewing gum, etc) Read the labels!  Try to read Dr. Janene Harvey book: "Program for Reversing Diabetes" for other ideas for healthy eating.

## 2016-09-26 NOTE — Progress Notes (Signed)
Patient ID: Gabriela [name], female   DOB: [birth year], 70 y.o.   MRN: [id number]   HPI: Gabriela [name] is a 70 y.o.-year-old female, referred by her PCP, Dr. Candiss Norse, for management of DM2, dx in06/2015, non-insulin-dependent, controlled, without complications.  Last hemoglobin A1c was: 08/08/2016: HbA1c 7.0% 02/04/2016: HbA1c 6.7% 05/12/2015: HbA1c 6.3% 11/17/2014: HbA1c 6.1% 08/07/2014: HbA1c 6.4%  Last year, she has been dx'ed with BrCA and also had a total hysterectomy and a pelvic repair surgery. Since the surgeries, the HbA1c started to increase.  Pt was recommended to start Metformin (Glumetza) in 08/15/2016. She did not start >> cannot afford it. She was also taking cinnamon 500 mg daily >> now off in last mo >> did not see an effect on CBGs.  Pt checks her sugars 1x a day and they are: - am: 130s-160, 170 - 2h after b'fast: n/c - before lunch: n/c - 2h after lunch: n/c - before dinner: n/c - 2h after dinner: n/c - bedtime: n/c - nighttime: n/c No lows. Lowest sugar was 120s; ? hypoglycemia awareness. Highest sugar was 170.  Glucometer: One Touch ultra  Pt's meals are: - Breakfast: multi-grain sandwich with egg salad - Lunch: may skip; multi-grain crackers with salad or homemade pimento cheese - Dinner: grilled or broiled meat and vegetable - Snacks: apple/pear; almonds/cashews  Walks 2 1/4 mi every day 6/7 days  - no CKD, last BUN/creatinine:  Lab Results  Component Value Date   BUN 19.1 07/19/2016   BUN 16.5 02/16/2016   CREATININE 0.9 07/19/2016   CREATININE 0.9 02/16/2016   - last set of lipids: 02/04/2016: 181/84/43/121 No results found for: CHOL, HDL, LDLCALC, LDLDIRECT, TRIG, CHOLHDL  She is on omega-3 fatty acids (2x 1062), garlic.Not on a statin. - last eye exam was in Summer 2017. No DR. + cataract L eye.  - no numbness and tingling in her feet. She is on alpha lipoic acid and B complex.  Pt has FH of DM in sister, MGM.  She has  a history of subdural hematoma 6 years ago after an accident when she was working at [employer]. Since then, she has hand tremors.  ROS: Constitutional: + weight gain, no fatigue, no subjective hyperthermia/hypothermia, + nocturia, + poor sleep Eyes: no blurry vision, no xerophthalmia ENT: no sore throat, no nodules palpated in throat, no dysphagia/odynophagia, no hoarseness Cardiovascular: no CP/SOB/palpitations/leg swelling Respiratory: no cough/SOB Gastrointestinal: no N/V/D/C Musculoskeletal: no muscle/joint aches Skin: no rashes Neurological: no tremors/numbness/tingling/dizziness Psychiatric: no depression/anxiety  Past Medical History:  Diagnosis Date  . Arthritis   . Chronic pain    from fall 2012  . Diabetes mellitus without complication (HCC)    diet controlled  . History of closed head injury 2012   accident work threw her 39f  . Hyperlipemia   . Urinary incontinence    wears a pessery   Past Surgical History:  Procedure Laterality Date  . ABDOMINAL HYSTERECTOMY    . BREAST LUMPECTOMY  1999   left  . COLONOSCOPY    . RADIOACTIVE SEED GUIDED MASTECTOMY WITH AXILLARY SENTINEL LYMPH NODE BIOPSY Left 02/08/2015   Procedure: RADIOACTIVE SEED LOCALIZATION LUMPECTOMY WITH LEFT AXILLARY SENTINEL LYMPH NODE BIOPSY;  Surgeon: BExcell Seltzer MD;  [facility]  Service: General;  Laterality: Left;  . SHOULDER ARTHROSCOPY  2012   left  . TONSILLECTOMY     Social History   Social History  . Marital status: Married    Spouse name: N/A  . Number  of children: 3   Occupational History  . retired   Social History Main Topics  . Smoking status: Never Smoker  . Smokeless tobacco: Never Used  . Alcohol use No  . Drug use: No   Current Outpatient Prescriptions on File Prior to Visit  Medication Sig Dispense Refill  . ALPHA LIPOIC ACID PO Take by mouth. Reported on 12/03/2015    . B-COMPLEX-C PO Take by mouth.    . cetirizine (ZYRTEC) 10 MG tablet  Take 10 mg by mouth.    . Cholecalciferol (VITAMIN D) 2000 units tablet Take by mouth.    . Cinnamon 500 MG TABS Take by mouth.    . Garlic 1000 MG CAPS Take by mouth.    Marland Kitchen glucose blood (ONE TOUCH ULTRA TEST) test strip Use once daily. Dx code: E11.9 Onr touch Ultra blue    . ibuprofen (ADVIL,MOTRIN) 600 MG tablet TK 1 T PO Q 6 H PRF MODERATE PAIN  2  . Omega-3 1000 MG CAPS Take by mouth.    . tamoxifen (NOLVADEX) 20 MG tablet TAKE 1 TABLET BY MOUTH  DAILY 90 tablet 0  . traMADol (ULTRAM) 50 MG tablet Reported on 12/03/2015     No current facility-administered medications on file prior to visit.    Allergies  Allergen Reactions  . Oxycontin [Oxycodone Hcl]   . Sulfa Antibiotics Itching   Family history: - Please see history of present illness  PE: BP 128/84   Pulse (!) 57   Ht 5\' 8"  (1.727 m)   Wt 196 lb (88.9 kg)   SpO2 96%   BMI 29.80 kg/m  Wt Readings from Last 3 Encounters:  09/26/16 196 lb (88.9 kg)  07/19/16 197 lb 9.6 oz (89.6 kg)  06/09/16 193 lb 0.2 oz (87.5 kg)   Constitutional: overweight, in NAD Eyes: PERRLA, EOMI, no exophthalmos ENT: moist mucous membranes, no thyromegaly, no cervical lymphadenopathy Cardiovascular: RRR, No MRG Respiratory: CTA B Gastrointestinal: abdomen soft, NT, ND, BS+ Musculoskeletal: no deformities, strength intact in all 4 Skin: moist, warm, no rashes Neurological: + Slight tremor with outstretched hands, DTR normal in all 4  ASSESSMENT: 1. DM2, non-insulin-dependent, controlled, without complications  PLAN:  1. Patient with 2.5 years of diet-controlled diabetes, now worsening. Last HbA1c was higher than before, and trending up. She was suggested to start Glumetza by PCP, however, this is washed expensive for her. At this visit, we discussed about the fact that metformin can greatly help her a.m. hyperglycemia and I suggested that she starts regular metformin at 500 mg twice a day. I also suggested that she start checking her sugars  at different times of the day. - I suggested to:  Patient Instructions  Please start Metformin 500 mg with dinner x 3 days, then increase to 500 mg 2x a day, with meals.  Check sugars 1x a day, rotating checktimes.  Please let me know if the sugars are consistently <80 or >180.  Please return in 3 months with your sugar log.   - Strongly advised her to start checking sugars at different times of the day - check once a day, rotating checks - given sugar log and advised how to fill it and to bring it at next appt  - given foot care handout and explained the principles  - given instructions for hypoglycemia management "15-15 rule"  - advised for yearly eye exams >> she is up-to-date with these and has them yearly usually. - Return to clinic in 3 mo  with sugar log   Philemon Kingdom, MD PhD [facility]

## 2016-10-19 ENCOUNTER — Telehealth: Payer: Self-pay | Admitting: Internal Medicine

## 2016-10-19 NOTE — Telephone Encounter (Signed)
See message and please advise, Thanks!  

## 2016-10-19 NOTE — Telephone Encounter (Signed)
Pt states the metformin is causing a huge problem with her having headaches and stomach upset, is there an alternate

## 2016-10-20 MED ORDER — METFORMIN HCL ER 500 MG PO TB24: 500.0000 mg | ORAL_TABLET | Freq: Two times a day (BID) | ORAL | 2 refills | Status: DC

## 2016-10-20 NOTE — Telephone Encounter (Signed)
Gabriela [name], let's send metformin extended-release 500 mg twice a day. This should help. She should let us know if she is having problems with this one also.

## 2016-10-20 NOTE — Telephone Encounter (Signed)
I contacted the patient and advised of MD's message. She agreed to changing to the metformin extended-release. Rx submitted to [facility] and patient advised to call back if she continues experience the headaches and up-set stomach.

## 2016-11-03 ENCOUNTER — Telehealth: Payer: Self-pay | Admitting: Internal Medicine

## 2016-11-03 NOTE — Telephone Encounter (Signed)
Metformin er 500 mg 90 day supply call to [facility] please

## 2016-11-07 ENCOUNTER — Other Ambulatory Visit: Payer: Self-pay

## 2016-11-07 MED ORDER — METFORMIN HCL ER 500 MG PO TB24: 500.0000 mg | ORAL_TABLET | Freq: Two times a day (BID) | ORAL | 2 refills | Status: DC

## 2016-11-15 NOTE — Telephone Encounter (Signed)
[facility]armacy has informed the pt that we need to call them and speak with a pharamcist about the metformin rx

## 2016-11-17 ENCOUNTER — Telehealth: Payer: Self-pay

## 2016-11-17 ENCOUNTER — Telehealth: Payer: Self-pay | Admitting: Internal Medicine

## 2016-11-17 NOTE — Telephone Encounter (Signed)
[facility]  Need to verify metformin 500 mg dosage.[phone]

## 2016-11-17 NOTE — Telephone Encounter (Signed)
Called [facility] to verify medication orders.

## 2016-11-22 ENCOUNTER — Other Ambulatory Visit: Payer: Self-pay | Admitting: Oncology

## 2016-11-22 ENCOUNTER — Other Ambulatory Visit: Payer: Self-pay | Admitting: Internal Medicine

## 2016-12-27 ENCOUNTER — Encounter: Payer: Self-pay | Admitting: Internal Medicine

## 2016-12-27 ENCOUNTER — Ambulatory Visit (INDEPENDENT_AMBULATORY_CARE_PROVIDER_SITE_OTHER): Payer: Medicare Other | Admitting: Internal Medicine

## 2016-12-27 VITALS — BP 124/84 | HR 68 | Ht 67.0 in | Wt 188.0 lb

## 2016-12-27 DIAGNOSIS — E119 Type 2 diabetes mellitus without complications: Secondary | ICD-10-CM

## 2016-12-27 LAB — POCT GLYCOSYLATED HEMOGLOBIN (HGB A1C): Hemoglobin A1C: 5.8

## 2016-12-27 MED ORDER — METFORMIN HCL ER 500 MG PO TB24: 500.0000 mg | ORAL_TABLET | Freq: Two times a day (BID) | ORAL | 3 refills | Status: DC

## 2016-12-27 NOTE — Addendum Note (Signed)
Addended by: Ena Dawley on: 12/27/2016 09:37 AM   Modules accepted: Orders

## 2016-12-27 NOTE — Progress Notes (Signed)
Patient ID: Gabriela [name], female   DOB: 1946-02-11, 71 y.o.   MRN: [id number]   HPI: Gabriela [name] is a 71 y.o.-year-old female, returning for f/u for DM2, dx in 04/2014, non-insulin-dependent, controlled, without complications. Last visit 3 mo ago.  She lost 8 lbs since last visit.   Last hemoglobin A1c was: 08/08/2016: HbA1c 7.0% 02/04/2016: HbA1c 6.7% 05/12/2015: HbA1c 6.3% 11/17/2014: HbA1c 6.1% 08/07/2014: HbA1c 6.4% In 2016 she has been dx'ed with BrCA and also had a total hysterectomy and a pelvic repair surgery. Since the surgeries, the HbA1c started to increase.  Pt is on:. - Metformin ER 500 mg bid - we started Metformin IR but she could not tolerate it: HA, reflux, borborygmi  Pt checks her sugars 1x a day and they are great: - am: 130s-160, 170 >> n/c - 2h after b'fast: n/c >> 113-159 - before lunch: n/c  - 2h after lunch: n/c >> 105-139 - before dinner: n/c - 2h after dinner: n/c >> 96-154 - bedtime: n/c - nighttime: n/c No lows. Lowest sugar was 120s >> 96; ? hypoglycemia awareness. Highest sugar was 170 >> 159.  Glucometer: One Touch ultra  Pt's meals are: - Breakfast: multi-grain sandwich with egg salad - Lunch: may skip; multi-grain crackers with salad or homemade pimento cheese - Dinner: grilled or broiled meat and vegetable - Snacks: apple/pear; almonds/cashews  Was walking 2 1/4 mi every day 6/7 days  - no CKD, last BUN/creatinine:  Lab Results  Component Value Date   BUN 19.1 07/19/2016   BUN 16.5 02/16/2016   CREATININE 0.9 07/19/2016   CREATININE 0.9 02/16/2016   - last set of lipids: 02/04/2016: 181/84/43/121 No results found for: CHOL, HDL, LDLCALC, LDLDIRECT, TRIG, CHOLHDL  She is on omega-3 fatty acids (2x 9450), garlic. Not on a statin. - last eye exam was in Summer 2017. No DR. + cataract L eye.  - no numbness and tingling in her feet. She is on alpha lipoic acid and B complex.  Pt has FH of DM in sister,  MGM.  She has a history of subdural hematoma 6 years ago after an accident when she was working at [employer]. Since then, she has hand tremors.  ROS: Constitutional: + weight loss, no fatigue, no subjective hyperthermia/hypothermia Eyes: no blurry vision, no xerophthalmia ENT: no sore throat, no nodules palpated in throat, no dysphagia/odynophagia, no hoarseness Cardiovascular: no CP/SOB/palpitations/leg swelling Respiratory: no cough/SOB Gastrointestinal: no N/V/D/C Musculoskeletal: no muscle/joint aches Skin: no rashes Neurological: no tremors/numbness/tingling/dizziness  I reviewed pt's medications, allergies, PMH, social hx, family hx, and changes were documented in the history of present illness. Otherwise, unchanged from my initial visit note.  Past Medical History:  Diagnosis Date  . Arthritis   . Chronic pain    from fall 2012  . Diabetes mellitus without complication (HCC)    diet controlled  . History of closed head injury 2012   accident work threw her 42f  . Hyperlipemia   . Urinary incontinence    wears a pessery   Past Surgical History:  Procedure Laterality Date  . ABDOMINAL HYSTERECTOMY    . BREAST LUMPECTOMY  1999   left  . COLONOSCOPY    . RADIOACTIVE SEED GUIDED MASTECTOMY WITH AXILLARY SENTINEL LYMPH NODE BIOPSY Left 02/08/2015   Procedure: RADIOACTIVE SEED LOCALIZATION LUMPECTOMY WITH LEFT AXILLARY SENTINEL LYMPH NODE BIOPSY;  Surgeon: BExcell Seltzer MD;  Location: [facility]  Service: General;  Laterality: Left;  . SHOULDER ARTHROSCOPY  2012  left  . TONSILLECTOMY     Social History   Social History  . Marital status: Married    Spouse name: N/A  . Number of children: 3   Occupational History  . retired   Social History Main Topics  . Smoking status: Never Smoker  . Smokeless tobacco: Never Used  . Alcohol use No  . Drug use: No   Current Outpatient Prescriptions on File Prior to Visit  Medication Sig Dispense Refill  .  ALPHA LIPOIC ACID PO Take by mouth. Reported on 12/03/2015    . B-COMPLEX-C PO Take by mouth.    . cetirizine (ZYRTEC) 10 MG tablet Take 10 mg by mouth.    . Cholecalciferol (VITAMIN D) 2000 units tablet Take by mouth.    . Cinnamon 500 MG TABS Take by mouth.    . Garlic 7681 MG CAPS Take by mouth.    Marland Kitchen glucose blood (ONE TOUCH ULTRA TEST) test strip Use once daily. Dx code: E11.9 Onr touch Ultra blue    . ibuprofen (ADVIL,MOTRIN) 600 MG tablet TK 1 T PO Q 6 H PRF MODERATE PAIN  2  . metFORMIN (GLUCOPHAGE-XR) 500 MG 24 hr tablet TAKE 1 TABLET BY MOUTH TWO  TIMES DAILY 180 tablet 0  . Omega-3 1000 MG CAPS Take by mouth.    . tamoxifen (NOLVADEX) 20 MG tablet TAKE 1 TABLET BY MOUTH  DAILY 90 tablet 1  . traMADol (ULTRAM) 50 MG tablet Reported on 12/03/2015     No current facility-administered medications on file prior to visit.    Allergies  Allergen Reactions  . Oxycontin [Oxycodone Hcl]   . Sulfa Antibiotics Itching   Family history: - Please see history of present illness  PE: BP 124/84 (BP Location: Left Arm, Patient Position: Sitting)   Pulse 68   Ht '5\' 7"'  (1.702 m)   Wt 188 lb (85.3 kg)   SpO2 97%   BMI 29.44 kg/m  Wt Readings from Last 3 Encounters:  12/27/16 188 lb (85.3 kg)  09/26/16 196 lb (88.9 kg)  07/19/16 197 lb 9.6 oz (89.6 kg)   Constitutional: overweight, in NAD Eyes: PERRLA, EOMI, no exophthalmos ENT: moist mucous membranes, no thyromegaly, no cervical lymphadenopathy Cardiovascular: RRR, No MRG Respiratory: CTA B Gastrointestinal: abdomen soft, NT, ND, BS+ Musculoskeletal: no deformities, strength intact in all 4 Skin: moist, warm, no rashes Neurological: no tremor with outstretched hands, DTR normal in all 4  ASSESSMENT: 1. DM2, non-insulin-dependent, controlled, without complications  PLAN:  1. Patient with 2.5 years of diet-controlled diabetes, now much better on Metformin ER 500 mg twice a day. She could not tolerate the IR Metformin but has no pbs  with the ER formulation. She also started to change her diet and feel much better than at last visit.  - I suggested to:  Patient Instructions  Please continue: - Metformin ER 500 mg 2x a day, with meals.  Check sugars 1x a day, rotating checktimes.  Please return in 4 months with your sugar log  - continue checking sugars at different times of the day - check once a day, rotating checks - advised for yearly eye exams >> she is up-to-date - HbA1c today: much better: 5.8% - Return to clinic in 4 mo with sugar log   Philemon Kingdom, MD PhD [facility]

## 2016-12-27 NOTE — Patient Instructions (Addendum)
Please continue: - Metformin ER 500 mg 2x a day, with meals.  Check sugars 1x a day, rotating checktimes.  Please return in 4 months with your sugar log.

## 2017-02-05 ENCOUNTER — Ambulatory Visit
Admission: RE | Admit: 2017-02-05 | Discharge: 2017-02-05 | Disposition: A | Discharge status: Home / Self Care | Payer: Medicare Other | Source: Ambulatory Visit | Attending: Oncology | Admitting: Oncology

## 2017-02-05 DIAGNOSIS — C50412 Malignant neoplasm of upper-outer quadrant of left female breast: Secondary | ICD-10-CM

## 2017-02-20 ENCOUNTER — Ambulatory Visit (HOSPITAL_BASED_OUTPATIENT_CLINIC_OR_DEPARTMENT_OTHER): Payer: Medicare Other | Admitting: Oncology

## 2017-02-20 ENCOUNTER — Telehealth: Payer: Self-pay | Admitting: Oncology

## 2017-02-20 ENCOUNTER — Other Ambulatory Visit (HOSPITAL_BASED_OUTPATIENT_CLINIC_OR_DEPARTMENT_OTHER): Payer: Medicare Other

## 2017-02-20 VITALS — BP 120/61 | HR 71 | Temp 97.9°F | Resp 18 | Ht 67.0 in | Wt 182.0 lb

## 2017-02-20 DIAGNOSIS — Z170 Estrogen receptor positive status [ER+]: Secondary | ICD-10-CM | POA: Diagnosis not present

## 2017-02-20 DIAGNOSIS — Z9181 History of falling: Secondary | ICD-10-CM | POA: Diagnosis not present

## 2017-02-20 DIAGNOSIS — M25551 Pain in right hip: Secondary | ICD-10-CM | POA: Diagnosis not present

## 2017-02-20 DIAGNOSIS — C50412 Malignant neoplasm of upper-outer quadrant of left female breast: Secondary | ICD-10-CM | POA: Diagnosis not present

## 2017-02-20 LAB — CBC WITH DIFFERENTIAL/PLATELET
BASO%: 0.8 % (ref 0.0–2.0)
BASOS ABS: 0 10*3/uL (ref 0.0–0.1)
EOS%: 0.9 % (ref 0.0–7.0)
Eosinophils Absolute: 0.1 10*3/uL (ref 0.0–0.5)
HEMATOCRIT: 36.9 % (ref 34.8–46.6)
HEMOGLOBIN: 12.4 g/dL (ref 11.6–15.9)
LYMPH#: 0.9 10*3/uL (ref 0.9–3.3)
LYMPH%: 15.5 % (ref 14.0–49.7)
MCH: 29.9 pg (ref 25.1–34.0)
MCHC: 33.6 g/dL (ref 31.5–36.0)
MCV: 89.1 fL (ref 79.5–101.0)
MONO#: 0.4 10*3/uL (ref 0.1–0.9)
MONO%: 6.5 % (ref 0.0–14.0)
NEUT#: 4.2 10*3/uL (ref 1.5–6.5)
NEUT%: 76.3 % (ref 38.4–76.8)
PLATELETS: 199 10*3/uL (ref 145–400)
RBC: 4.14 10*6/uL (ref 3.70–5.45)
RDW: 13 % (ref 11.2–14.5)
WBC: 5.5 10*3/uL (ref 3.9–10.3)

## 2017-02-20 LAB — COMPREHENSIVE METABOLIC PANEL
ALBUMIN: 3.9 g/dL (ref 3.5–5.0)
ALK PHOS: 19 U/L — AB (ref 40–150)
ALT: 12 U/L (ref 0–55)
ANION GAP: 9 meq/L (ref 3–11)
AST: 16 U/L (ref 5–34)
BUN: 14.7 mg/dL (ref 7.0–26.0)
CALCIUM: 9.5 mg/dL (ref 8.4–10.4)
CHLORIDE: 103 meq/L (ref 98–109)
CO2: 27 mEq/L (ref 22–29)
CREATININE: 0.9 mg/dL (ref 0.6–1.1)
EGFR: 67 mL/min/{1.73_m2} — ABNORMAL LOW (ref >90)
Glucose: 192 mg/dl — ABNORMAL HIGH (ref 70–140)
Potassium: 4.7 mEq/L (ref 3.5–5.1)
Sodium: 138 mEq/L (ref 136–145)
Total Bilirubin: 0.6 mg/dL (ref 0.20–1.20)
Total Protein: 6.6 g/dL (ref 6.4–8.3)

## 2017-02-20 NOTE — Telephone Encounter (Signed)
Gave patient avs report and appointments for April 2019.  °

## 2017-02-20 NOTE — Progress Notes (Signed)
Gabriela [name]  Telephone:(336) [phone] Fax:(336) [phone]    ID: Gabriela [name] DOB: 05/17/1946  MR#: [id number]  IRW#:[id number]  Patient Care Team: Excell Seltzer, MD as Consulting Physician (General Surgery) Philemon Kingdom, MD as Consulting Physician (Internal Medicine) PCP: No primary care provider on file. GYN: SU: Excell Seltzer M.D. OTHER MD: Prentiss Bells "[name]" [place], [place]; Berton Mount MD, Dossie Arbour MD  CHIEF COMPLAINT: Estrogen receptor positive breast cancer  CURRENT TREATMENT: Tamoxifen  BREAST CANCER HISTORY: From the original intake note:  The patient herself noted a lump in her left breast, associated with some discomfort. She brought it to her physician's attention and on 01/15/2015 underwent left digital mammography with tomography and ultrasonography at [facility]. This showed breast density to be category A. Compression images were negative but on physical exam there was a firm palpable mobile nodule at the 3:00 position of the left breast. By ultrasound this measured 9 mm maximally. There is no note regarding axillary adenopathy in the mammographic report.  On 01/18/2015 the patient underwent biopsy of the mass in question and this showed ([id number]; [id number]) an invasive mammary carcinoma, measuring 3 mm on the specimen, grade 1, estrogen receptor greater than 90% positive, progesterone receptor between 11 and 50% positive, with no HER-2 amplification  (Immunohistochemistry score 1+).  On 02/03/2015 the patient underwent bilateral breast MRI. This suggested a breast density category B. The right breast was negative and there were no abnormal appearing lymph nodes. In the left breast there was an irregular enhancing mass subareolar early measuring 9 mm.  Her subsequent history is as detailed below  INTERVAL HISTORY: Gabriela [name] returns today for follow-up of her estrogen receptor positive breast cancer. Interval history is  generally benign. She continues on tamoxifen, with good tolerance. She has never had any hot flashes or vaginal discharge related to this drug. She obtains it at a good price.   REVIEW OF SYSTEMS: [name] tells me she had a bad fall at work about 7-1/2 years ago. She never got compensation for it. A broke some of her lower vertebrae and dislocated her hip to some extent and she is having significant right hip pain at present. She is seeing a chiropractor regarding this. She tells me that he obtained plain films of that area and he immediately was able to tell that she had trouble previously. Aside from this a detailed review of systems today was stable  PAST MEDICAL HISTORY: Past Medical History:  Diagnosis Date  . Arthritis   . Chronic pain    from fall 2012  . Diabetes mellitus without complication (HCC)    diet controlled  . History of closed head injury 2012   accident work threw her 68f  . Hyperlipemia   . Urinary incontinence    wears a pessery    PAST SURGICAL HISTORY: Past Surgical History:  Procedure Laterality Date  . ABDOMINAL HYSTERECTOMY    . BREAST LUMPECTOMY  1999   left  . COLONOSCOPY    . RADIOACTIVE SEED GUIDED MASTECTOMY WITH AXILLARY SENTINEL LYMPH NODE BIOPSY Left 02/08/2015   Procedure: RADIOACTIVE SEED LOCALIZATION LUMPECTOMY WITH LEFT AXILLARY SENTINEL LYMPH NODE BIOPSY;  Surgeon: BExcell Seltzer MD;  Location: [facility]  Service: General;  Laterality: Left;  . SHOULDER ARTHROSCOPY  2012   left  . TONSILLECTOMY     FAMILY HISTORY No family history on file. The patient's father died in an automobile accident at age 70 The patient's mother died at the age of  32 from colon cancer. The patient has been told that her mother was on a medication during her pregnancy that may have caused the colon cancer. The patient's mother had a sister diagnosed with breast and cervical cancer in her 64s. She also had a brother who was killed in World War II  1. There is no other history of cancer in the family to the patient's knowledge   GYNECOLOGIC HISTORY:  No LMP recorded. Patient has had a hysterectomy. Menarche age 6, first live birth age 4. The patient is GX P3. She went through the change of life approximately age 60. She did not take hormone replacement. She never used birth control   SOCIAL HISTORY:  The patient and her husband Gabriela [name] are both retired. They live on [place] acres in our quite busy caring for that. At is also Klee's chief source of exercise. [name] is an occupational Programmer, applications in [place]. [name] is a Cabin crew in [place]. [name] works as a Librarian, academic for the twin [employer] in [place]. The patient has 4 grandchildren. She is a Psychologist, forensic.    ADVANCED DIRECTIVES: In place   HEALTH MAINTENANCE: Social History  Substance Use Topics  . Smoking status: Never Smoker  . Smokeless tobacco: Never Used  . Alcohol use No     Colonoscopy:2008/current nodal clinic  PAP:2015  Bone density: 2007  Lipid panel:  Allergies  Allergen Reactions  . Oxycontin [Oxycodone Hcl]   . Sulfa Antibiotics Itching    Current Outpatient Prescriptions  Medication Sig Dispense Refill  . ALPHA LIPOIC ACID PO Take by mouth. Reported on 12/03/2015    . B-COMPLEX-C PO Take by mouth.    . cetirizine (ZYRTEC) 10 MG tablet Take 10 mg by mouth.    . Cholecalciferol (VITAMIN D) 2000 units tablet Take by mouth.    . Cinnamon 500 MG TABS Take by mouth.    . Garlic 2992 MG CAPS Take by mouth.    Marland Kitchen glucose blood (ONE TOUCH ULTRA TEST) test strip Use once daily. Dx code: E11.9 Onr touch Ultra blue    . ibuprofen (ADVIL,MOTRIN) 600 MG tablet TK 1 T PO Q 6 H PRF MODERATE PAIN  2  . metFORMIN (GLUCOPHAGE-XR) 500 MG 24 hr tablet Take 1 tablet (500 mg total) by mouth 2 (two) times daily. 180 tablet 3  . Omega-3 1000 MG CAPS Take by mouth.    . tamoxifen (NOLVADEX) 20 MG tablet TAKE 1 TABLET BY MOUTH  DAILY  90 tablet 1  . traMADol (ULTRAM) 50 MG tablet Reported on 12/03/2015     No current facility-administered medications for this visit.     OBJECTIVE: Middle-aged white woman in no acute distress   BP: 120/61  Pulse: 71  Resp: 18  Temp: 97.9 F (36.6 C)     Body mass index is 28.51 kg/m.    ECOG FS:1 - Symptomatic but completely ambulatory  Sclerae unicteric, EOMs intact Oropharynx clear and moist No cervical or supraclavicular adenopathy Lungs no rales or rhonchi Heart regular rate and rhythm Abd soft, nontender, positive bowel sounds MSK no focal spinal tenderness, no upper extremity lymphedema Neuro: nonfocal, well oriented, appropriate affect Breasts: The right breast is unremarkable. The left breast has undergone lumpectomy and radiation. There is no evidence of local recurrence. The left axilla is benign.  LAB RESULTS:  CMP     Component Value Date/Time   NA 138 02/20/2017 1149   K 4.7 02/20/2017 1149  CO2 27 02/20/2017 1149   GLUCOSE 192 (H) 02/20/2017 1149   BUN 14.7 02/20/2017 1149   CREATININE 0.9 02/20/2017 1149   CALCIUM 9.5 02/20/2017 1149   PROT 6.6 02/20/2017 1149   ALBUMIN 3.9 02/20/2017 1149   AST 16 02/20/2017 1149   ALT 12 02/20/2017 1149   ALKPHOS 19 (L) 02/20/2017 1149   BILITOT 0.60 02/20/2017 1149    INo results found for: SPEP, UPEP  Lab Results  Component Value Date   WBC 5.5 02/20/2017   NEUTROABS 4.2 02/20/2017   HGB 12.4 02/20/2017   HCT 36.9 02/20/2017   MCV 89.1 02/20/2017   PLT 199 02/20/2017      Chemistry      Component Value Date/Time   NA 138 02/20/2017 1149   K 4.7 02/20/2017 1149   CO2 27 02/20/2017 1149   BUN 14.7 02/20/2017 1149   CREATININE 0.9 02/20/2017 1149      Component Value Date/Time   CALCIUM 9.5 02/20/2017 1149   ALKPHOS 19 (L) 02/20/2017 1149   AST 16 02/20/2017 1149   ALT 12 02/20/2017 1149   BILITOT 0.60 02/20/2017 1149       No results found for: LABCA2  No components found for:  LABCA125  No results for input(s): INR in the last 168 hours.  Urinalysis No results found for: COLORURINE, APPEARANCEUR, LABSPEC, PHURINE, GLUCOSEU, HGBUR, BILIRUBINUR, [place], [place], UROBILINOGEN, NITRITE, LEUKOCYTESUR  STUDIES: Mm Diag Breast Tomo Bilateral  Result Date: 02/05/2017 CLINICAL DATA:  History of left breast cancer in 2016 status post breast conservation surgery. EXAM: 2D DIGITAL DIAGNOSTIC BILATERAL MAMMOGRAM WITH CAD AND ADJUNCT TOMO COMPARISON:  Previous exam(s). ACR Breast Density Category b: There are scattered areas of fibroglandular density. FINDINGS: There are expected postsurgical changes within the subareolar left breast. There are no new dominant masses, suspicious calcifications or secondary signs of malignancy within either breast. Mammographic images were processed with CAD. IMPRESSION: No evidence of malignancy within either breast. Expected postsurgical changes within the left breast. RECOMMENDATION: Bilateral diagnostic mammogram in 1 year. I have discussed the findings and recommendations with the patient. Results were also provided in writing at the conclusion of the visit. If applicable, a reminder letter will be sent to the patient regarding the next appointment. BI-RADS CATEGORY  2: Benign. Electronically Signed   By: Franki Cabot M.D.   On: 02/05/2017 09:51    ASSESSMENT: 71 y.o. [place] woman status post left breast upper outer quadrant biopsy 01/18/2015 for a clinical T1b N0, stage IA invasive ductal carcinoma, grade 1, estrogen and progesterone receptor positive, HER-2 not amplified  (1) left lumpectomy and sentinel lymph node sampling 02/08/2015 showed a pT1b pN0, stage IA invasive ductal carcinoma, grade 1, with repeat HER-2 again negative. Margins were negative but close  (2) Oncotype DX showed a score of 14, predicting a risk of recurrence outside the breast in the next 10 years of 9% if the patient's only systemic treatment is tamoxifen for 5  years. It also protects no benefit from adjuvant chemotherapy.  (3) adjuvant radiation completed in [facility] 05/11/2015  (4) tamoxifen started 05/20/2015  (5) TAH-BSO in December 2016. Benign pathology.  PLAN: Trevor is now 2 years out from definitive surgery for her breast cancer with no evidence of disease recurrence. This is very favorable.  She is tolerating tamoxifen well and the plan will be to continue that to a total of 5 years.  I don't believe the problems with her right hip are due to her cancer. They  stay in back to her fall about 7-1/2 years ago. If the chiropractor help she is seeking does not solve the problem we can always refer her to orthopedics  At this point I'm going to start seeing her on a once a year basis. She knows to call for any problems that may develop before the next visit.  Chauncey Cruel, MD   02/20/2017 12:50 PM

## 2017-05-14 LAB — HM DIABETES EYE EXAM

## 2017-05-16 ENCOUNTER — Encounter: Payer: Self-pay | Admitting: Internal Medicine

## 2017-05-16 ENCOUNTER — Ambulatory Visit (INDEPENDENT_AMBULATORY_CARE_PROVIDER_SITE_OTHER): Payer: Medicare Other | Admitting: Internal Medicine

## 2017-05-16 VITALS — BP 122/72 | HR 76 | Ht 67.0 in | Wt 179.0 lb

## 2017-05-16 DIAGNOSIS — E119 Type 2 diabetes mellitus without complications: Secondary | ICD-10-CM

## 2017-05-16 LAB — POCT GLYCOSYLATED HEMOGLOBIN (HGB A1C): Hemoglobin A1C: 5.9

## 2017-05-16 MED ORDER — METFORMIN HCL ER 500 MG PO TB24: 500.0000 mg | ORAL_TABLET | Freq: Two times a day (BID) | ORAL | 3 refills | Status: DC

## 2017-05-16 NOTE — Addendum Note (Signed)
Addended by: Caprice Beaver T on: 05/16/2017 10:15 AM   Modules accepted: Orders

## 2017-05-16 NOTE — Patient Instructions (Addendum)
Please continue: - Metformin ER 500 mg 2x a day, with meals.  Check sugars 1x a day, rotating checktimes.  Please return in 6 months with your sugar log

## 2017-05-16 NOTE — Progress Notes (Signed)
Patient ID: Gabriela [name], female   DOB: [birth year], 71 y.o.   MRN: [id number]   HPI: Gabriela [name] is a 71 y.o.-year-old female, returning for f/u for DM2, dx in 04/2014, non-insulin-dependent, controlled, without complications. Last visit 70 mo ago.  She got a steroid inj 2 days ago. Sugars better.  Last hemoglobin A1c was: Lab Results  Component Value Date   HGBA1C 5.8 12/27/2016   08/08/2016: HbA1c 7.0% 02/04/2016: HbA1c 6.7% 05/12/2015: HbA1c 6.3% 11/17/2014: HbA1c 6.1% 08/07/2014: HbA1c 6.4% In 2016 she has been dx'ed with BrCA and also had a total hysterectomy and a pelvic repair surgery. Since the surgeries, the HbA1c started to increase.  Pt is on: - Metformin ER 500 mg bid - we started Metformin IR but she could not tolerate it: HA, reflux, borborygmi  Pt checks her sugars 1x a day: - am: 130s-160, 170 >> n/c  - 2h after b'fast: n/c >> 113-159 >> 91-137 - before lunch: n/c  - 2h after lunch: n/c >> 105-139 >> 87-123 - before dinner: n/c - 2h after dinner: n/c >> 96-154 >> 95-119, 247 (steroids) - bedtime: n/c - nighttime: n/c No lows. Lowest sugar was 120s >> 96 >> 87; ? hypoglycemia awareness. Highest sugar was 170 >> 159 >> 247 (steroids).  Glucometer: One Touch ultra  Pt's meals are: - Breakfast: multi-grain sandwich with egg salad - Lunch: may skip; multi-grain crackers with salad or homemade pimento cheese - Dinner: grilled or broiled meat and vegetable - Snacks: apple/pear; almonds/cashews  - No CKD, last BUN/creatinine:  Lab Results  Component Value Date   BUN 14.7 02/20/2017   BUN 19.1 07/19/2016   CREATININE 0.9 02/20/2017   CREATININE 0.9 07/19/2016   - last set of lipids: 02/04/2016: 181/84/43/121 No results found for: CHOL, HDL, LDLCALC, LDLDIRECT, TRIG, CHOLHDL  She is on omega-3 fatty acids (2x 9191), garlic. Not on a statin. - last eye exam was 05/2017. No DR. + cataract L eye.  - No numbness and tingling in her feet.  She is on ALA + B cplx.  Pt has FH of DM in sister, MGM.  She has a history of subdural hematoma 6 years ago after an accident when she was working at [employer]. Since then, she has hand tremors.  ROS: Constitutional: no weight gain/no weight loss, no fatigue, no subjective hyperthermia, no subjective hypothermia Eyes: no blurry vision, no xerophthalmia ENT: no sore throat, no nodules palpated in throat, no dysphagia, no odynophagia, no hoarseness Cardiovascular: no CP/no SOB/no palpitations/no leg swelling Respiratory: no cough/no SOB/no wheezing Gastrointestinal: no N/no V/no D/no C/no acid reflux Musculoskeletal: no muscle aches/no joint aches Skin: no rashes, no hair loss Neurological: no tremors/no numbness/no tingling/no dizziness  I reviewed pt's medications, allergies, PMH, social hx, family hx, and changes were documented in the history of present illness. Otherwise, unchanged from my initial visit note.   Past Medical History:  Diagnosis Date  . Arthritis   . Chronic pain    from fall 2012  . Diabetes mellitus without complication (HCC)    diet controlled  . History of closed head injury 2012   accident work threw her 40f  . Hyperlipemia   . Urinary incontinence    wears a pessery   Past Surgical History:  Procedure Laterality Date  . ABDOMINAL HYSTERECTOMY    . BREAST LUMPECTOMY  1999   left  . COLONOSCOPY    . RADIOACTIVE SEED GUIDED MASTECTOMY WITH AXILLARY SENTINEL LYMPH NODE BIOPSY Left 02/08/2015  Procedure: RADIOACTIVE SEED LOCALIZATION LUMPECTOMY WITH LEFT AXILLARY SENTINEL LYMPH NODE BIOPSY;  Surgeon: Excell Seltzer, MD;  Location: [facility];  Service: General;  Laterality: Left;  . SHOULDER ARTHROSCOPY  2012   left  . TONSILLECTOMY     Social History   Social History  . Marital status: Married    Spouse name: N/A  . Number of children: 3   Occupational History  . retired   Social History Main Topics  . Smoking status: Never  Smoker  . Smokeless tobacco: Never Used  . Alcohol use No  . Drug use: No   Current Outpatient Prescriptions on File Prior to Visit  Medication Sig Dispense Refill  . ALPHA LIPOIC ACID PO Take by mouth. Reported on 12/03/2015    . B-COMPLEX-C PO Take by mouth.    . cetirizine (ZYRTEC) 10 MG tablet Take 10 mg by mouth.    . Cholecalciferol (VITAMIN D) 2000 units tablet Take by mouth.    . Garlic 5456 MG CAPS Take by mouth.    Marland Kitchen glucose blood (ONE TOUCH ULTRA TEST) test strip Use once daily. Dx code: E11.9 Onr touch Ultra blue    . metFORMIN (GLUCOPHAGE-XR) 500 MG 24 hr tablet Take 1 tablet (500 mg total) by mouth 2 (two) times daily. 180 tablet 3  . Omega-3 1000 MG CAPS Take by mouth.    . tamoxifen (NOLVADEX) 20 MG tablet TAKE 1 TABLET BY MOUTH  DAILY 90 tablet 1  . ibuprofen (ADVIL,MOTRIN) 600 MG tablet TK 1 T PO Q 6 H PRF MODERATE PAIN  2  . traMADol (ULTRAM) 50 MG tablet Reported on 12/03/2015     No current facility-administered medications on file prior to visit.    Allergies  Allergen Reactions  . Oxycontin [Oxycodone Hcl]   . Sulfa Antibiotics Itching   Family history: - Please see history of present illness  PE: BP 122/72 (BP Location: Left Arm, Patient Position: Sitting)   Pulse 76   Ht '5\' 7"'  (1.702 m)   Wt 179 lb (81.2 kg)   SpO2 97%   BMI 28.04 kg/m   Wt Readings from Last 3 Encounters:  05/16/17 179 lb (81.2 kg)  02/20/17 182 lb (82.6 kg)  12/27/16 188 lb (85.3 kg)   Constitutional: overweight, in NAD Eyes: PERRLA, EOMI, no exophthalmos ENT: moist mucous membranes, no thyromegaly, no cervical lymphadenopathy Cardiovascular: RRR, No MRG Respiratory: CTA B Gastrointestinal: abdomen soft, NT, ND, BS+ Musculoskeletal: no deformities, strength intact in all 4 Skin: moist, warm, no rashes Neurological: no tremor with outstretched hands, DTR normal in all 4  ASSESSMENT: 1. DM2, non-insulin-dependent, controlled, without complications  PLAN:  1. Pt has  controlled DM on a low dose of Metformin ER. Her sugars are still well controlled except one high sugars after a steroid inj. Will not change her regimens for now.  - she will schedule a new appt with a PCP soon >> will have Lipid panel checked then - I suggested to:  Patient Instructions  Please continue: - Metformin ER 500 mg 2x a day, with meals.  Check sugars 1x a day, rotating checktimes.  Please return in 6 months with your sugar log  - today, HbA1c is 5.9% (great!) - continue checking sugars at different times of the day - check 1x a day, rotating checks - advised for yearly eye exams >> she is UTD - Return to clinic in 6 mo with sugar log   Philemon Kingdom, MD PhD [facility] Endocrinology

## 2017-06-04 ENCOUNTER — Other Ambulatory Visit: Payer: Self-pay

## 2017-06-04 MED ORDER — ONETOUCH DELICA LANCETS 33G MISC: 5 refills | Status: DC

## 2017-06-04 MED ORDER — GLUCOSE BLOOD VI STRP: ORAL_STRIP | 5 refills | Status: DC

## 2017-06-13 ENCOUNTER — Other Ambulatory Visit: Payer: Self-pay | Admitting: Oncology

## 2017-06-14 ENCOUNTER — Encounter: Payer: Self-pay | Admitting: *Deleted

## 2017-06-15 ENCOUNTER — Encounter (HOSPITAL_COMMUNITY): Admission: EM | Disposition: A | Discharge status: Home / Self Care | Payer: Self-pay | Source: Home / Self Care

## 2017-06-15 ENCOUNTER — Emergency Department (HOSPITAL_COMMUNITY): Payer: Medicare Other

## 2017-06-15 ENCOUNTER — Emergency Department: Payer: Medicare Other

## 2017-06-15 ENCOUNTER — Encounter: Payer: Self-pay | Admitting: Emergency Medicine

## 2017-06-15 ENCOUNTER — Inpatient Hospital Stay: Admit: 2017-06-15 | Payer: Medicare Other | Admitting: Orthopedic Surgery

## 2017-06-15 ENCOUNTER — Encounter (HOSPITAL_COMMUNITY): Payer: Self-pay

## 2017-06-15 ENCOUNTER — Emergency Department (HOSPITAL_COMMUNITY): Payer: Medicare Other | Admitting: Anesthesiology

## 2017-06-15 ENCOUNTER — Observation Stay (HOSPITAL_COMMUNITY)
Admission: EM | Admit: 2017-06-15 | Discharge: 2017-06-16 | Disposition: A | Discharge status: Home / Self Care | Payer: Medicare Other | Attending: Orthopedic Surgery | Admitting: Orthopedic Surgery

## 2017-06-15 ENCOUNTER — Emergency Department
Admission: EM | Admit: 2017-06-15 | Discharge: 2017-06-15 | Disposition: A | Discharge status: Other Acute Inpatient Hospital | Payer: Medicare Other | Attending: Emergency Medicine | Admitting: Emergency Medicine

## 2017-06-15 DIAGNOSIS — Y93H9 Activity, other involving exterior property and land maintenance, building and construction: Secondary | ICD-10-CM | POA: Diagnosis not present

## 2017-06-15 DIAGNOSIS — S62308B Unspecified fracture of other metacarpal bone, initial encounter for open fracture: Principal | ICD-10-CM | POA: Insufficient documentation

## 2017-06-15 DIAGNOSIS — Z79810 Long term (current) use of selective estrogen receptor modulators (SERMs): Secondary | ICD-10-CM | POA: Diagnosis not present

## 2017-06-15 DIAGNOSIS — M79641 Pain in right hand: Secondary | ICD-10-CM | POA: Insufficient documentation

## 2017-06-15 DIAGNOSIS — Y999 Unspecified external cause status: Secondary | ICD-10-CM | POA: Diagnosis not present

## 2017-06-15 DIAGNOSIS — Z419 Encounter for procedure for purposes other than remedying health state, unspecified: Secondary | ICD-10-CM

## 2017-06-15 DIAGNOSIS — W3189XA Contact with other specified machinery, initial encounter: Secondary | ICD-10-CM | POA: Diagnosis not present

## 2017-06-15 DIAGNOSIS — Z7984 Long term (current) use of oral hypoglycemic drugs: Secondary | ICD-10-CM | POA: Insufficient documentation

## 2017-06-15 DIAGNOSIS — Y929 Unspecified place or not applicable: Secondary | ICD-10-CM | POA: Insufficient documentation

## 2017-06-15 DIAGNOSIS — Z23 Encounter for immunization: Secondary | ICD-10-CM | POA: Diagnosis not present

## 2017-06-15 DIAGNOSIS — S62101B Fracture of unspecified carpal bone, right wrist, initial encounter for open fracture: Secondary | ICD-10-CM | POA: Diagnosis not present

## 2017-06-15 DIAGNOSIS — S6991XA Unspecified injury of right wrist, hand and finger(s), initial encounter: Secondary | ICD-10-CM | POA: Diagnosis present

## 2017-06-15 DIAGNOSIS — Z79899 Other long term (current) drug therapy: Secondary | ICD-10-CM | POA: Insufficient documentation

## 2017-06-15 DIAGNOSIS — S63054A Dislocation of other carpometacarpal joint of right hand, initial encounter: Secondary | ICD-10-CM | POA: Diagnosis present

## 2017-06-15 DIAGNOSIS — W312XXA Contact with powered woodworking and forming machines, initial encounter: Secondary | ICD-10-CM | POA: Insufficient documentation

## 2017-06-15 DIAGNOSIS — S62131B Displaced fracture of capitate [os magnum] bone, right wrist, initial encounter for open fracture: Secondary | ICD-10-CM | POA: Diagnosis not present

## 2017-06-15 DIAGNOSIS — Z9012 Acquired absence of left breast and nipple: Secondary | ICD-10-CM | POA: Diagnosis not present

## 2017-06-15 DIAGNOSIS — G8929 Other chronic pain: Secondary | ICD-10-CM | POA: Insufficient documentation

## 2017-06-15 DIAGNOSIS — E119 Type 2 diabetes mellitus without complications: Secondary | ICD-10-CM | POA: Insufficient documentation

## 2017-06-15 HISTORY — PX: ORIF WRIST FRACTURE: SHX2133

## 2017-06-15 LAB — CBC
HEMATOCRIT: 35.8 % (ref 35.0–47.0)
Hemoglobin: 12.1 g/dL (ref 12.0–16.0)
MCH: 29.7 pg (ref 26.0–34.0)
MCHC: 33.8 g/dL (ref 32.0–36.0)
MCV: 88 fL (ref 80.0–100.0)
PLATELETS: 238 10*3/uL (ref 150–440)
RBC: 4.07 MIL/uL (ref 3.80–5.20)
RDW: 13.3 % (ref 11.5–14.5)
WBC: 6.3 10*3/uL (ref 3.6–11.0)

## 2017-06-15 LAB — BASIC METABOLIC PANEL
ANION GAP: 7 (ref 5–15)
BUN: 15 mg/dL (ref 6–20)
CALCIUM: 8.9 mg/dL (ref 8.9–10.3)
CO2: 28 mmol/L (ref 22–32)
Chloride: 103 mmol/L (ref 101–111)
Creatinine, Ser: 0.7 mg/dL (ref 0.44–1.00)
GFR calc Af Amer: >60 mL/min (ref >60)
GLUCOSE: 150 mg/dL — AB (ref 65–99)
Potassium: 3.8 mmol/L (ref 3.5–5.1)
Sodium: 138 mmol/L (ref 135–145)

## 2017-06-15 LAB — PROTIME-INR
INR: 0.95
Prothrombin Time: 12.7 seconds (ref 11.4–15.2)

## 2017-06-15 LAB — GLUCOSE, CAPILLARY
GLUCOSE-CAPILLARY: 147 mg/dL — AB (ref 65–99)
Glucose-Capillary: 129 mg/dL — ABNORMAL HIGH (ref 65–99)

## 2017-06-15 SURGERY — OPEN REDUCTION INTERNAL FIXATION (ORIF) WRIST FRACTURE
Anesthesia: General | Site: Wrist | Laterality: Right

## 2017-06-15 MED ORDER — ONDANSETRON HCL 4 MG/2ML IJ SOLN: 4.0000 mg | Freq: Four times a day (QID) | INTRAMUSCULAR | Status: DC | PRN

## 2017-06-15 MED ORDER — PHENYLEPHRINE 40 MCG/ML (10ML) SYRINGE FOR IV PUSH (FOR BLOOD PRESSURE SUPPORT)
PREFILLED_SYRINGE | INTRAVENOUS | Status: AC
  Filled 2017-06-15: qty 20

## 2017-06-15 MED ORDER — CEFAZOLIN SODIUM-DEXTROSE 2-4 GM/100ML-% IV SOLN
2.0000 g | INTRAVENOUS | Status: AC
  Administered 2017-06-15: 2 g via INTRAVENOUS
  Filled 2017-06-15: qty 100

## 2017-06-15 MED ORDER — ONDANSETRON HCL 4 MG/2ML IJ SOLN
INTRAMUSCULAR | Status: DC | PRN
  Administered 2017-06-15: 4 mg via INTRAVENOUS

## 2017-06-15 MED ORDER — LACTATED RINGERS IV SOLN
INTRAVENOUS | Status: DC
  Administered 2017-06-15: 15:00:00 via INTRAVENOUS

## 2017-06-15 MED ORDER — SODIUM CHLORIDE 0.9 % IR SOLN
Status: DC | PRN
  Administered 2017-06-15: 3000 mL

## 2017-06-15 MED ORDER — EPHEDRINE 5 MG/ML INJ
INTRAVENOUS | Status: AC
  Filled 2017-06-15: qty 10

## 2017-06-15 MED ORDER — ONDANSETRON HCL 4 MG PO TABS: 4.0000 mg | ORAL_TABLET | Freq: Four times a day (QID) | ORAL | Status: DC | PRN

## 2017-06-15 MED ORDER — FENTANYL CITRATE (PF) 100 MCG/2ML IJ SOLN
25.0000 ug | Freq: Once | INTRAMUSCULAR | Status: AC
  Administered 2017-06-15: 25 ug via INTRAVENOUS

## 2017-06-15 MED ORDER — FENTANYL CITRATE (PF) 250 MCG/5ML IJ SOLN
INTRAMUSCULAR | Status: AC
  Filled 2017-06-15: qty 5

## 2017-06-15 MED ORDER — TETANUS-DIPHTH-ACELL PERTUSSIS 5-2.5-18.5 LF-MCG/0.5 IM SUSP
0.5000 mL | Freq: Once | INTRAMUSCULAR | Status: AC
  Administered 2017-06-15: 0.5 mL via INTRAMUSCULAR
  Filled 2017-06-15: qty 0.5

## 2017-06-15 MED ORDER — FENTANYL CITRATE (PF) 100 MCG/2ML IJ SOLN: 50.0000 ug | Freq: Once | INTRAMUSCULAR | Status: DC

## 2017-06-15 MED ORDER — PROPOFOL 10 MG/ML IV BOLUS
INTRAVENOUS | Status: AC
  Filled 2017-06-15: qty 20

## 2017-06-15 MED ORDER — MELOXICAM 7.5 MG PO TABS
15.0000 mg | ORAL_TABLET | Freq: Every day | ORAL | Status: DC
  Administered 2017-06-15 – 2017-06-16 (×2): 15 mg via ORAL
  Filled 2017-06-15 (×2): qty 2

## 2017-06-15 MED ORDER — ACETAMINOPHEN 10 MG/ML IV SOLN
INTRAVENOUS | Status: AC
  Filled 2017-06-15: qty 100

## 2017-06-15 MED ORDER — DEXAMETHASONE SODIUM PHOSPHATE 10 MG/ML IJ SOLN
INTRAMUSCULAR | Status: DC | PRN
  Administered 2017-06-15: 10 mg via INTRAVENOUS

## 2017-06-15 MED ORDER — ADULT MULTIVITAMIN W/MINERALS CH
1.0000 | ORAL_TABLET | Freq: Every day | ORAL | Status: DC
  Administered 2017-06-15 – 2017-06-16 (×2): 1 via ORAL
  Filled 2017-06-15 (×2): qty 1

## 2017-06-15 MED ORDER — FENTANYL CITRATE (PF) 100 MCG/2ML IJ SOLN
INTRAMUSCULAR | Status: DC | PRN
  Administered 2017-06-15: 25 ug via INTRAVENOUS
  Administered 2017-06-15: 50 ug via INTRAVENOUS
  Administered 2017-06-15: 25 ug via INTRAVENOUS

## 2017-06-15 MED ORDER — MIDAZOLAM HCL 5 MG/5ML IJ SOLN
INTRAMUSCULAR | Status: DC | PRN
  Administered 2017-06-15: 2 mg via INTRAVENOUS

## 2017-06-15 MED ORDER — VITAMIN C 500 MG PO TABS
1000.0000 mg | ORAL_TABLET | Freq: Every day | ORAL | Status: DC
  Administered 2017-06-15 – 2017-06-16 (×2): 1000 mg via ORAL
  Filled 2017-06-15 (×2): qty 2

## 2017-06-15 MED ORDER — KETAMINE HCL 10 MG/ML IJ SOLN
INTRAMUSCULAR | Status: DC | PRN
  Administered 2017-06-15: 10 mg via INTRAVENOUS
  Administered 2017-06-15: 30 mg via INTRAVENOUS

## 2017-06-15 MED ORDER — MELOXICAM 15 MG PO TABS: 15.0000 mg | ORAL_TABLET | Freq: Every day | ORAL | 1 refills | Status: DC

## 2017-06-15 MED ORDER — HYDROMORPHONE HCL 1 MG/ML IJ SOLN: 0.5000 mg | INTRAMUSCULAR | Status: DC | PRN

## 2017-06-15 MED ORDER — HYDROMORPHONE HCL 1 MG/ML IJ SOLN
INTRAMUSCULAR | Status: AC
  Administered 2017-06-15: 19:00:00
  Filled 2017-06-15: qty 1

## 2017-06-15 MED ORDER — HYDROCODONE-ACETAMINOPHEN 5-325 MG PO TABS: 1.0000 | ORAL_TABLET | Freq: Four times a day (QID) | ORAL | 0 refills | Status: DC | PRN

## 2017-06-15 MED ORDER — DOCUSATE SODIUM 100 MG PO CAPS
100.0000 mg | ORAL_CAPSULE | Freq: Two times a day (BID) | ORAL | Status: DC
  Administered 2017-06-15 – 2017-06-16 (×2): 100 mg via ORAL
  Filled 2017-06-15 (×2): qty 1

## 2017-06-15 MED ORDER — DEXAMETHASONE SODIUM PHOSPHATE 10 MG/ML IJ SOLN
INTRAMUSCULAR | Status: AC
  Filled 2017-06-15: qty 4

## 2017-06-15 MED ORDER — KETAMINE HCL-SODIUM CHLORIDE 100-0.9 MG/10ML-% IV SOSY
PREFILLED_SYRINGE | INTRAVENOUS | Status: AC
  Filled 2017-06-15: qty 10

## 2017-06-15 MED ORDER — CEPHALEXIN 500 MG PO CAPS: 500.0000 mg | ORAL_CAPSULE | Freq: Four times a day (QID) | ORAL | 0 refills | Status: AC

## 2017-06-15 MED ORDER — CEFAZOLIN SODIUM-DEXTROSE 1-4 GM/50ML-% IV SOLN
1.0000 g | Freq: Three times a day (TID) | INTRAVENOUS | Status: AC
  Administered 2017-06-15 – 2017-06-16 (×2): 1 g via INTRAVENOUS
  Filled 2017-06-15 (×2): qty 50

## 2017-06-15 MED ORDER — FAMOTIDINE 20 MG PO TABS: 20.0000 mg | ORAL_TABLET | Freq: Two times a day (BID) | ORAL | Status: DC | PRN

## 2017-06-15 MED ORDER — PROPOFOL 10 MG/ML IV BOLUS
INTRAVENOUS | Status: DC | PRN
Start: 2017-06-15 — End: 2017-06-15
  Administered 2017-06-15: 150 mg via INTRAVENOUS

## 2017-06-15 MED ORDER — MEPERIDINE HCL 25 MG/ML IJ SOLN: 6.2500 mg | INTRAMUSCULAR | Status: DC | PRN

## 2017-06-15 MED ORDER — CEFAZOLIN SODIUM-DEXTROSE 1-4 GM/50ML-% IV SOLN
1.0000 g | Freq: Once | INTRAVENOUS | Status: AC
  Administered 2017-06-15: 1 g via INTRAVENOUS
  Filled 2017-06-15 (×2): qty 50

## 2017-06-15 MED ORDER — ONDANSETRON HCL 4 MG/2ML IJ SOLN
4.0000 mg | Freq: Once | INTRAMUSCULAR | Status: AC
  Administered 2017-06-15: 4 mg via INTRAVENOUS
  Filled 2017-06-15: qty 2

## 2017-06-15 MED ORDER — ONDANSETRON HCL 4 MG/2ML IJ SOLN
INTRAMUSCULAR | Status: AC
  Filled 2017-06-15: qty 10

## 2017-06-15 MED ORDER — GABAPENTIN 600 MG PO TABS
300.0000 mg | ORAL_TABLET | Freq: Three times a day (TID) | ORAL | Status: DC
  Administered 2017-06-15 – 2017-06-16 (×2): 300 mg via ORAL
  Filled 2017-06-15 (×2): qty 1

## 2017-06-15 MED ORDER — 0.9 % SODIUM CHLORIDE (POUR BTL) OPTIME
TOPICAL | Status: DC | PRN
  Administered 2017-06-15: 1000 mL

## 2017-06-15 MED ORDER — TAMOXIFEN CITRATE 10 MG PO TABS
20.0000 mg | ORAL_TABLET | Freq: Every day | ORAL | Status: DC
  Administered 2017-06-16: 20 mg via ORAL
  Filled 2017-06-15: qty 2

## 2017-06-15 MED ORDER — KETOROLAC TROMETHAMINE 30 MG/ML IJ SOLN: 30.0000 mg | Freq: Once | INTRAMUSCULAR | Status: DC | PRN

## 2017-06-15 MED ORDER — FENTANYL CITRATE (PF) 100 MCG/2ML IJ SOLN
25.0000 ug | INTRAMUSCULAR | Status: AC
  Administered 2017-06-15: 25 ug via INTRAVENOUS
  Filled 2017-06-15: qty 2

## 2017-06-15 MED ORDER — MIDAZOLAM HCL 2 MG/2ML IJ SOLN
INTRAMUSCULAR | Status: AC
  Filled 2017-06-15: qty 2

## 2017-06-15 MED ORDER — FENTANYL CITRATE (PF) 100 MCG/2ML IJ SOLN
25.0000 ug | Freq: Once | INTRAMUSCULAR | Status: AC
  Administered 2017-06-15: 25 ug via INTRAVENOUS
  Filled 2017-06-15: qty 2

## 2017-06-15 MED ORDER — CEFAZOLIN SODIUM-DEXTROSE 1-4 GM/50ML-% IV SOLN
1.0000 g | Freq: Three times a day (TID) | INTRAVENOUS | Status: DC
  Filled 2017-06-15: qty 50

## 2017-06-15 MED ORDER — ACETAMINOPHEN 10 MG/ML IV SOLN
INTRAVENOUS | Status: DC | PRN
  Administered 2017-06-15: 1000 mg via INTRAVENOUS

## 2017-06-15 MED ORDER — MENTHOL 3 MG MT LOZG
1.0000 | LOZENGE | OROMUCOSAL | Status: DC | PRN
Start: 2017-06-15 — End: 2017-06-16
  Filled 2017-06-15 (×2): qty 9

## 2017-06-15 MED ORDER — LIDOCAINE 2% (20 MG/ML) 5 ML SYRINGE
INTRAMUSCULAR | Status: DC | PRN
  Administered 2017-06-15: 100 mg via INTRAVENOUS

## 2017-06-15 MED ORDER — LORATADINE 10 MG PO TABS
10.0000 mg | ORAL_TABLET | Freq: Every day | ORAL | Status: DC
  Administered 2017-06-16: 10 mg via ORAL
  Filled 2017-06-15: qty 1

## 2017-06-15 MED ORDER — CEFAZOLIN SODIUM-DEXTROSE 1-4 GM/50ML-% IV SOLN: 1.0000 g | Freq: Three times a day (TID) | INTRAVENOUS | Status: DC

## 2017-06-15 MED ORDER — FENTANYL CITRATE (PF) 100 MCG/2ML IJ SOLN
50.0000 ug | Freq: Once | INTRAMUSCULAR | Status: AC
  Administered 2017-06-15: 50 ug via INTRAVENOUS
  Filled 2017-06-15: qty 2

## 2017-06-15 MED ORDER — PROMETHAZINE HCL 25 MG/ML IJ SOLN: 6.2500 mg | INTRAMUSCULAR | Status: DC | PRN

## 2017-06-15 MED ORDER — METFORMIN HCL ER 500 MG PO TB24
500.0000 mg | ORAL_TABLET | Freq: Two times a day (BID) | ORAL | Status: DC
  Administered 2017-06-16: 500 mg via ORAL
  Filled 2017-06-15 (×2): qty 1

## 2017-06-15 MED ORDER — HYDROCODONE-ACETAMINOPHEN 5-325 MG PO TABS
1.0000 | ORAL_TABLET | Freq: Four times a day (QID) | ORAL | Status: DC | PRN
  Administered 2017-06-15 (×2): 1 via ORAL
  Filled 2017-06-15 (×2): qty 1

## 2017-06-15 MED ORDER — HYDROMORPHONE HCL 1 MG/ML IJ SOLN
0.2500 mg | INTRAMUSCULAR | Status: DC | PRN
  Administered 2017-06-15 (×2): 0.5 mg via INTRAVENOUS

## 2017-06-15 MED ORDER — LIDOCAINE 2% (20 MG/ML) 5 ML SYRINGE
INTRAMUSCULAR | Status: AC
  Filled 2017-06-15: qty 20

## 2017-06-15 MED ORDER — AMOXICILLIN-POT CLAVULANATE 875-125 MG PO TABS: 1.0000 | ORAL_TABLET | Freq: Two times a day (BID) | ORAL | 0 refills | Status: DC

## 2017-06-15 SURGICAL SUPPLY — 69 items
BANDAGE ACE 3X5.8 VEL STRL LF (GAUZE/BANDAGES/DRESSINGS) ×4 IMPLANT
BANDAGE ACE 4X5 VEL STRL LF (GAUZE/BANDAGES/DRESSINGS) IMPLANT
BANDAGE COBAN STERILE 2 (GAUZE/BANDAGES/DRESSINGS) IMPLANT
BLADE CLIPPER SURG (BLADE) IMPLANT
BLADE MINI RND TIP GREEN BEAV (BLADE) IMPLANT
BLADE SURG 15 STRL LF DISP TIS (BLADE) ×2 IMPLANT
BLADE SURG 15 STRL SS (BLADE) ×4
BNDG CMPR 9X4 STRL LF SNTH (GAUZE/BANDAGES/DRESSINGS) ×2
BNDG COHESIVE 4X5 TAN STRL (GAUZE/BANDAGES/DRESSINGS) ×4 IMPLANT
BNDG ESMARK 4X9 LF (GAUZE/BANDAGES/DRESSINGS) ×4 IMPLANT
BNDG GAUZE ELAST 4 BULKY (GAUZE/BANDAGES/DRESSINGS) ×5 IMPLANT
BRUSH SCRUB EZ PLAIN DRY (MISCELLANEOUS) IMPLANT
CANISTER SUCT 3000ML PPV (MISCELLANEOUS) ×4 IMPLANT
CHLORAPREP W/TINT 10.5 ML (MISCELLANEOUS) ×1 IMPLANT
CHLORAPREP W/TINT 26ML (MISCELLANEOUS) ×1 IMPLANT
CORDS BIPOLAR (ELECTRODE) ×4 IMPLANT
COVER BACK TABLE 60X90IN (DRAPES) ×4 IMPLANT
COVER SURGICAL LIGHT HANDLE (MISCELLANEOUS) ×4 IMPLANT
CUFF TOURNIQUET SINGLE 18IN (TOURNIQUET CUFF) IMPLANT
CUFF TOURNIQUET SINGLE 24IN (TOURNIQUET CUFF) IMPLANT
DRAPE C-ARM 42X72 X-RAY (DRAPES) ×4 IMPLANT
DRAPE SURG 17X23 STRL (DRAPES) ×4 IMPLANT
DRSG ADAPTIC 3X8 NADH LF (GAUZE/BANDAGES/DRESSINGS) ×4 IMPLANT
DRSG EMULSION OIL 3X3 NADH (GAUZE/BANDAGES/DRESSINGS) IMPLANT
GAUZE SPONGE 4X4 12PLY STRL (GAUZE/BANDAGES/DRESSINGS) ×4 IMPLANT
GAUZE XEROFORM 1X8 LF (GAUZE/BANDAGES/DRESSINGS) IMPLANT
GLOVE BIO SURGEON STRL SZ7.5 (GLOVE) ×4 IMPLANT
GLOVE BIOGEL PI IND STRL 8 (GLOVE) ×2 IMPLANT
GLOVE BIOGEL PI INDICATOR 8 (GLOVE) ×2
GOWN STRL REUS W/ TWL LRG LVL3 (GOWN DISPOSABLE) ×4 IMPLANT
GOWN STRL REUS W/ TWL XL LVL3 (GOWN DISPOSABLE) ×2 IMPLANT
GOWN STRL REUS W/TWL LRG LVL3 (GOWN DISPOSABLE) ×8
GOWN STRL REUS W/TWL XL LVL3 (GOWN DISPOSABLE) ×4
IV CATH 14GX2 1/4 (CATHETERS) ×3 IMPLANT
K-WIRE .045X45 (WIRE) ×18 IMPLANT
KIT BASIN OR (CUSTOM PROCEDURE TRAY) ×4 IMPLANT
KIT ROOM TURNOVER OR (KITS) ×4 IMPLANT
MANIFOLD NEPTUNE II (INSTRUMENTS) ×4 IMPLANT
NDL BLUNT 16X1.5 OR ONLY (NEEDLE) IMPLANT
NDL HYPO 25X1 1.5 SAFETY (NEEDLE) IMPLANT
NEEDLE BLUNT 16X1.5 OR ONLY (NEEDLE) ×8 IMPLANT
NEEDLE HYPO 25X1 1.5 SAFETY (NEEDLE) IMPLANT
NS IRRIG 1000ML POUR BTL (IV SOLUTION) ×4 IMPLANT
PACK ORTHO EXTREMITY (CUSTOM PROCEDURE TRAY) ×4 IMPLANT
PAD ARMBOARD 7.5X6 YLW CONV (MISCELLANEOUS) ×8 IMPLANT
PAD CAST 4YDX4 CTTN HI CHSV (CAST SUPPLIES) ×2 IMPLANT
PADDING CAST ABS 4INX4YD NS (CAST SUPPLIES)
PADDING CAST ABS COTTON 4X4 ST (CAST SUPPLIES) IMPLANT
PADDING CAST COTTON 4X4 STRL (CAST SUPPLIES) ×4
PENCIL BUTTON HOLSTER BLD 10FT (ELECTRODE) IMPLANT
RUBBERBAND STERILE (MISCELLANEOUS) IMPLANT
SET CYSTO W/LG BORE CLAMP LF (SET/KITS/TRAYS/PACK) ×3 IMPLANT
SPLINT PLASTER CAST XFAST 4X15 (CAST SUPPLIES) ×1 IMPLANT
SPLINT PLASTER XTRA FAST SET 4 (CAST SUPPLIES) ×2
SPONGE LAP 18X18 X RAY DECT (DISPOSABLE) ×3 IMPLANT
SUCTION FRAZIER TIP 10 FR DISP (SUCTIONS) ×3 IMPLANT
SUT VIC AB 2-0 CT3 27 (SUTURE) ×4 IMPLANT
SUT VIC AB 2-0 SH 27 (SUTURE) ×4
SUT VIC AB 2-0 SH 27XBRD (SUTURE) ×1 IMPLANT
SUT VICRYL 4-0 PS2 18IN ABS (SUTURE) IMPLANT
SUT VICRYL RAPIDE 4/0 PS 2 (SUTURE) ×13 IMPLANT
SYR 10ML LL (SYRINGE) IMPLANT
TOWEL OR 17X24 6PK STRL BLUE (TOWEL DISPOSABLE) ×4 IMPLANT
TOWEL OR 17X26 10 PK STRL BLUE (TOWEL DISPOSABLE) ×4 IMPLANT
TUBE CONNECTING 12'X1/4 (SUCTIONS) ×1
TUBE CONNECTING 12X1/4 (SUCTIONS) ×2 IMPLANT
UNDERPAD 30X30 (UNDERPADS AND DIAPERS) ×4 IMPLANT
WATER STERILE IRR 1000ML POUR (IV SOLUTION) ×1 IMPLANT
YANKAUER SUCT BULB TIP NO VENT (SUCTIONS) ×3 IMPLANT

## 2017-06-15 NOTE — Op Note (Signed)
06/15/2017  6:31 PM  PATIENT:  Gabriela [name]  71 y.o. female  PRE-OPERATIVE DIAGNOSIS: Open intercarpal and carpometacarpal fracture-dislocations of the right wrist  POST-OPERATIVE DIAGNOSIS:  Same  PROCEDURE:   1.  Excisional debridement of skin, subcutaneous tissues, tendon, and bone associated with open fracture    2.  Open reduction with pin fixation of 2-3 intermetacarpal fracture-dislocation    3.  Open reduction with pin fixation of capitate fracture-dislocation    4.  Repair of volar and dorsal wrist capsule-ligaments    5.  Intermediate repair of right distal forearm wound, 8 cm    6.  Intermediate repair of right hand wounds, 18 cm combined  SURGEON: Rayvon Char. Grandville Silos, MD  PHYSICIAN ASSISTANT: Morley Kos, OPA-C  ANESTHESIA:  general  SPECIMENS:  None  DRAINS:   None  EBL:  less than 50 mL  PREOPERATIVE INDICATIONS:  Gabriela [name] is a  71 y.o. female with a wood splitter injury to the right wrist, transferred from [facility].  The risks benefits and alternatives were discussed with the patient preoperatively including but not limited to the risks of infection, bleeding, nerve injury, cardiopulmonary complications, the need for revision surgery, among others, and the patient verbalized understanding and consented to proceed.  OPERATIVE IMPLANTS: 0.045 inch K wires 3  OPERATIVE PROCEDURE:  After receiving prophylactic antibiotics, the patient was escorted to the operative theatre and placed in a supine position.  General anesthesia was administered.  A surgical "time-out" was performed during which the planned procedure, proposed operative site, and the correct patient identity were compared to the operative consent and agreement confirmed by the circulating nurse according to current facility policy.  Following application of a tourniquet to the operative extremity, the exposed skin was prepped with Chloraprep and draped in  the usual sterile fashion.  The limb was exsanguinated with an Esmarch bandage and the tourniquet inflated to approximately 148mmHg higher than systolic BP.  Excisional debridement of the open fracture-dislocation wounds was performed first.  Jagged skin edges were excised full-thickness, with some further debridement of at risk subcutaneous tissues.  With exploration of the volar wound, it appeared as if the radial artery was intact.  There was partial division and stripping of a portion of the APL, particularly the slip to the trapezium, and this was excised.  There were some exposed branches of the superficial radial nerve or lateral antebrachial cutaneous nerve crossing the wound, entering the skin at the base of the thenar flap which would be dorsal radial in its anatomic location.  The wound was explored and understanding of the anatomy and injury surmised.  Further, there was debridement of the bone fragment attached to remaining ligaments from the intercarpal ligaments.  This was done both volarly and dorsally.  After satisfied with the extent of excisional debridement, the injury was copiously irrigated with running irrigant via cystoscopy tubing.  The injury was such that the examiner's finger or the cystoscopy tubing could be advanced all the way through the tear in the volar capsule, through the region of the carpus and out the dorsal wound.  The chondral surfaces were in reasonably good condition.  After irrigation, attention was directed to reduction and fixation of the altered anatomy.  This was ultimately secured with 2 different 0.045 inch K wire securing the capitate to the bases of the third and fourth metacarpal, and then another K wire driven through the base of the second, across the third and into the  fourth metacarpal.  With this, carpal alignment was much improved.  I attempted to pin the lunate to the triquetrum, as the LT ligament was injured.  Interestingly, with movement of the wrist  back and forth into radial and ulnar deviation under fluoroscopy.  The scapholunate interval did not gap, nor did the LT interval.  I believe the scapholunate ligament itself was completely intact.  The LT was not intact, at least volarly, but it appeared the dorsal portions were intact, preventing gapping.  After pinning the triquetrum to the lunate, I observed on the volar perspective and found that it articular surfaces were not as nicely approximated volarly as I would wish, and so the K wire was removed.  In fact, fact the volar portions of the LT joint were much better approximated when the volar soft tissues were just gathered together, which was ultimately done with 2-0 Vicryl suture.  This reason, after taking final images, I remove the K wire that had been placed from the triquetrum into the lunate and simply repair the dorsal and volar capsules with 2-0 Vicryl suture.  Satisfied with wrist alignment and stability, I shifted attention to the skin, which was carefully reapproximated with 4-0 Vicryl Rapide interrupted sutures.  There are multiple sutures placed, and multiple flaps of skin.  There are a couple long very slender proximally based skin flaps that may not ultimately make it.  Tourniquet was released and these skin injuries were repaired with the tourniquet down.  Some additional hemostasis was obtained with bipolar electrocautery and the process.  The wounds were again irrigated just prior to closure.  Once satisfied with closure, the K wires which up and bent over 90 and clipped were then padded appropriately, the wound covered with Adaptic, gauze, and a bulky splint dressing with a volar plaster component.  The digits were left out of the dressing.  She was awakened and taken to the recovery room in stable condition, breathing spontaneously.  DISPOSITION: She'll be discharged from the recovery room to the floor, kept at least overnight for observation and additional antibiotic  administration, and possibly discharged tomorrow.  When she follows up in 7-10 days, there should be new x-rays of the right wrist (3 view) in the splint dressing, and then addressing carefully removed for inspection of the wound.

## 2017-06-15 NOTE — ED Triage Notes (Addendum)
Pt BIB [facility] from [facility]. 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 Pt presents with area wrapped in very thick gauze blood stained but bleeding appears to be controlled. Pt A&O X4 stating "it took off a lot of skin". Per report from Karen Chafe, MD [place] Silos is to see patient and aware that she is here. 20G placed in Left forearm by [facility]. 150 Fentanyl, 4 Zofran, 1 gm Ancef, and tetanus given PTA. XR and CT at [facility] shows avulsion with dislocation and open fracture per [facility].

## 2017-06-15 NOTE — H&P (Deleted)
  The note originally documented on this encounter has been moved the the encounter in which it belongs.  

## 2017-06-15 NOTE — ED Triage Notes (Signed)
Pt using wood splitter this morning at home and her right hand got caught in it.. Pt wearing glove as well which was cut off upon arrival to room.

## 2017-06-15 NOTE — ED Notes (Signed)
Pt DC to [facility] with [facility], report previously called to [facility] at [facility].  Pt alert and oriented, Pain medication given prior to transport.

## 2017-06-15 NOTE — ED Notes (Signed)
Dressing re-wrapped with saline soaked gauze and wrapped with kling wrap. Pt has arm propped up on pillow as well. Family at bedside.  Dr. Jacqualine Code recently in room to update patient on plan of care.

## 2017-06-15 NOTE — ED Provider Notes (Addendum)
[facility] Emergency Department Provider Note ____________________________________________   First MD Initiated Contact with Patient 06/15/17 0820     (approximate)  I have reviewed the triage vital signs and the nursing notes.   HISTORY  Chief Complaint Hand Injury  HPI Gabriela [name] is a 71 y.o. female presents for evaluation of injury to the right hand with splinter of wood  Patient reports she was operating wood splitter today, her hand became caught in it and had a crush type injury but she reports it was minimal and that she was able to back the splinter out quickly. She reports significant pain in the right hand. He reports no other injury. Did have a scratch on her upper forearm, but reports this happened a couple days ago from when the dog scratched her and she has not seen any infection from it. She has not had a tetanus in about 5 years.  Denies any other injury. She did not fall. It was not intentional.  Report significant pain throughout the right hand. She denies any loss of sensation or movement in the hand. She is able to wiggle all fingers and gives a thumbs up sign.   Past Medical History:  Diagnosis Date  . Arthritis   . Chronic pain    from fall 2012  . Diabetes mellitus without complication (HCC)    diet controlled  . History of closed head injury 2012   accident work threw her 45ft  . Hyperlipemia   . Urinary incontinence    wears a pessery    Patient Active Problem List   Diagnosis Date Noted  . Malignant neoplasm of upper-outer quadrant of left breast in female, estrogen receptor positive ([place]) 07/19/2016  . Diabetes mellitus type II, non insulin dependent ([place]) 02/06/2015    Past Surgical History:  Procedure Laterality Date  . ABDOMINAL HYSTERECTOMY    . BREAST LUMPECTOMY  1999   left  . COLONOSCOPY    . RADIOACTIVE SEED GUIDED MASTECTOMY WITH AXILLARY SENTINEL LYMPH NODE BIOPSY Left 02/08/2015   Procedure: RADIOACTIVE SEED LOCALIZATION LUMPECTOMY WITH LEFT AXILLARY SENTINEL LYMPH NODE BIOPSY;  Surgeon: Excell Seltzer, MD;  Location: [place];  Service: General;  Laterality: Left;  . SHOULDER ARTHROSCOPY  2012   left  . TONSILLECTOMY      Prior to Admission medications   Medication Sig Start Date End Date Taking? Authorizing Provider  ALPHA LIPOIC ACID PO Take by mouth. Reported on 12/03/2015    [provider]  amoxicillin-clavulanate (AUGMENTIN) 875-125 MG tablet Take 1 tablet by mouth 2 (two) times daily. 06/15/17   Delman Kitten, MD  B-COMPLEX-C PO Take by mouth.    [provider]  cetirizine (ZYRTEC) 10 MG tablet Take 10 mg by mouth.    [provider]  Cholecalciferol (VITAMIN D) 2000 units tablet Take by mouth.    [provider]  Garlic 1937 MG CAPS Take by mouth.    [provider]  glucose blood (ONE TOUCH ULTRA TEST) test strip Use once daily. Dx code: E11.9 Onr touch Ultra blue 06/04/17   Philemon Kingdom, MD  ibuprofen (ADVIL,MOTRIN) 600 MG tablet TK 1 T PO Q 6 H PRF MODERATE PAIN 10/11/15   [provider]  metFORMIN (GLUCOPHAGE-XR) 500 MG 24 hr tablet Take 1 tablet (500 mg total) by mouth 2 (two) times daily. 05/16/17   Philemon Kingdom, MD  Omega-3 1000 MG CAPS Take by mouth.    [provider]  ONETOUCH DELICA LANCETS 26Z MISC Use to check sugar daily 06/04/17   Philemon Kingdom, MD  tamoxifen (NOLVADEX) 20 MG tablet TAKE 1 TABLET BY MOUTH  DAILY 06/13/17   Magrinat, Virgie Dad, MD  traMADol Veatrice Bourbon) 50 MG tablet Reported on 12/03/2015 04/01/14   [provider]    Allergies Oxycontin [oxycodone hcl] and Sulfa antibiotics  History reviewed. No pertinent family history.  Social History Social History  Substance Use Topics  . Smoking status: Never Smoker  . Smokeless tobacco: Never Used  . Alcohol use No    Review of Systems Constitutional: No fever/chills Eyes: No visual  changes. ENT: No sore throat. Cardiovascular: Denies chest pain. Respiratory: Denies shortness of breath. Gastrointestinal: No abdominal pain.  No nausea, no vomiting.  No diarrhea.  No constipation. Genitourinary: Negative for dysuria. Musculoskeletal: Negative for back pain. Skin: Negative for rash. Neurological: Negative for headaches, focal weakness or numbness.    ____________________________________________   PHYSICAL EXAM:  VITAL SIGNS: ED Triage Vitals  Enc Vitals Group     BP 06/15/17 0817 (!) 121/59     Pulse Rate 06/15/17 0817 (!) 54     Resp 06/15/17 0817 18     Temp 06/15/17 0817 98.1 F (36.7 C)     Temp Source 06/15/17 0817 Oral     SpO2 06/15/17 0817 98 %     Weight 06/15/17 0820 177 lb (80.3 kg)     Height 06/15/17 0820 5\' 8"  (1.727 m)     Head Circumference --      Peak Flow --      Pain Score 06/15/17 0815 9     Pain Loc --      Pain Edu? --      Excl. in [place]? --     Constitutional: Alert and oriented. Well appearing and in no acute distress. Eyes: Conjunctivae are normal. Head: Atraumatic. Nose: No congestion/rhinnorhea. Mouth/Throat: Mucous membranes are moist. Neck: No stridor.   Cardiovascular: Normal rate, regular rhythm. Grossly normal heart sounds.  Good peripheral circulation. Respiratory: Normal respiratory effort.  No retractions. Lungs CTAB. Gastrointestinal: Soft and nontender. No distention. Musculoskeletal:    RIGHT Right upper extremity demonstrates normal strength, good use of all muscles. No edema bruising or contusions of the right shoulder/upper arm, right elbow, right forearm. Full range of motion of the right right upper extremity. Strong radial pulse. Intact median/ulnar/radial neuro-muscular exam.  LEFT Left upper extremity demonstrates normal strength, good use of all muscles. No edema bruising or contusions of the left shoulder/upper arm, left elbow, left forearm / hand. Full range of motion of the left  upper extremity  without pain. No evidence of trauma. Strong radial pulse. Intact median/ulnar/radial neuro-muscular exam.   Right hand Median, ulnar, radial motor intact though some limitation in ability to fully exam muscular stength due to pain reported with any movement of the fingers of the hand, but overall no deficit noted. Cap refill less than 2 seconds all digits. Strong radial pulse. Motion noted throughout the hand intrinsics, flexion and extension at the wrist. Patient has a large area of skin tearing and avulsion overlying the hyperthenar eminence as well as the dorsal side of the hand. The skin cannot be pulled over to find closure, there is a approximately a 4 cm x 5 cm defect in the epidermis. Tendons are visible, but it appears the overlying fascia is intact. There is no frank or obvious tendon injury noted. She appears to have avulsed the skin over multiple segments  of the hand and dermis, but there is no obvious deep injury or deep laceration. Bleeding is well controlled.  Left hand Median, ulnar, radial motor intact. Cap refill less than 2 seconds all digits. Strong radial pulse. 5 out of 5 strength throughout the hand intrinsics, flexion and extension at the wrist. No evidence of trauma. ____________________________________________   No lower extremity tenderness nor edema. Neurologic:  Normal speech and language. No gross focal neurologic deficits are appreciated.  Skin:  Skin is warm, dry and intact. No rash noted. Psychiatric: Mood and affect are normal. Speech and behavior are normal.  ____________________________________________   LABS (all labs ordered are listed, but only abnormal results are displayed)  Labs Reviewed  BASIC METABOLIC PANEL - Abnormal; Notable for the following:       Result Value   Glucose, Bld 150 (*)    All other components within normal limits  CBC  PROTIME-INR    ____________________________________________  EKG   ____________________________________________  RADIOLOGY  Dg Hand Complete Right  Result Date: 06/15/2017 CLINICAL DATA:  Right hand injury with wood splitter. EXAM: RIGHT HAND - COMPLETE 3+ VIEW COMPARISON:  None. FINDINGS: There is a fracture of the volar dislocation of the capitate. Remaining carpal alignment appears preserved. Multiple soft tissue lacerations are noted about the wrist. IMPRESSION: Fracture and volar dislocation of the capitate. Electronically Signed   By: Titus Dubin M.D.   On: 06/15/2017 09:10    ____________________________________________   PROCEDURES  Procedure(s) performed: None  Procedures  Critical Care performed: Yes, see critical care note(s)  CRITICAL CARE Performed by: Delman Kitten   Total critical care time: 35 minutes  Critical care time was exclusive of separately billable procedures and treating other patients.  Critical care was necessary to treat or prevent imminent or life-threatening deterioration.  Critical care was time spent personally by me on the following activities: development of treatment plan with patient and/or surrogate as well as nursing, discussions with consultants, evaluation of patient's response to treatment, examination of patient, obtaining history from patient or surrogate, ordering and performing treatments and interventions, ordering and review of laboratory studies, ordering and review of radiographic studies, pulse oximetry and re-evaluation of patient's condition.  ____________________________________________   INITIAL IMPRESSION / ASSESSMENT AND PLAN / ED COURSE  Pertinent labs & imaging results that were available during my care of the patient were reviewed by me and considered in my medical decision making (see chart for details).  Isolated right hand injury. Extensive injury and avulsion of the skin. Demonstrates intact neurosensory and vascular  exam in the right hand, despite what appears to be significant avulsion injuries of the skin overlying.  ----------------------------------------- 10:36 AM on 06/15/2017 -----------------------------------------  Spoke with Dr. Roland Rack on call for orthopedics, advised this patient will need transfer for hand surgery valve. I spoke with Dr. Milly Jakob at [facility] who reviewed cases me, he agrees with current therapy and recommends transfer to [facility] were he will see and evaluate the patient and likely take to surgery.  At this time, patient being treated for probable open fracture of the carpals including the capitate. Dr. Grandville Silos requests a noncontrast CT of the wrist be performed while the patient's transfer is being arranged which has been ordered.  ----------------------------------------- 10:36 AM on 06/15/2017 -----------------------------------------  Patient reevaluated. No ongoing bleeding. Resting comfortably. Awaiting transfer to [facility] for evaluation with hand surgery. Patient agreeable with plan for transfer.    ----------------------------------------- 11:05 AM on 06/15/2017 -----------------------------------------  Pain improved.  Resting comfortable. Repeat imaging is some oozing into the bandage. No heavy or large bleeding. Wet to dry. Appears stable for transfer via [facility] to [facility] ED.  ____________________________________________   FINAL CLINICAL IMPRESSION(S) / ED DIAGNOSES  Final diagnoses:  Open displaced fracture of capitate of right wrist, initial encounter      NEW MEDICATIONS STARTED DURING THIS VISIT:  New Prescriptions   AMOXICILLIN-CLAVULANATE (AUGMENTIN) 875-125 MG TABLET    Take 1 tablet by mouth 2 (two) times daily.     Note:  This document was prepared using Dragon voice recognition software and may include unintentional dictation errors.     Delman Kitten, MD 06/15/17  1105  ----------------------------------------- 12:11 PM on 06/15/2017 -----------------------------------------  Reevaluation before transfer. Patient resting. Alert and oriented, appears pain is coming back and she reports pain is now returning to the right hand. We'll provide additional final. Patient appears to continue to be stable for transfer. New bandages without any evidence of blood or bleeding noted within them. I did not take her bandages back down.   Delman Kitten, MD 06/15/17 [phone]

## 2017-06-15 NOTE — ED Notes (Signed)
emtala reviewed by this RN 

## 2017-06-15 NOTE — Anesthesia Preprocedure Evaluation (Addendum)
Anesthesia Evaluation  Patient identified by MRN, date of birth, ID band Patient awake    Reviewed: Allergy & Precautions, H&P , NPO status , Patient's Chart, lab work & pertinent test results  Airway Mallampati: I  TM Distance: >3 FB Neck ROM: Full    Dental no notable dental hx. (+) Teeth Intact, Dental Advisory Given   Pulmonary neg pulmonary ROS,    Pulmonary exam normal breath sounds clear to auscultation       Cardiovascular negative cardio ROS   Rhythm:Regular Rate:Normal     Neuro/Psych negative neurological ROS  negative psych ROS   GI/Hepatic negative GI ROS, Neg liver ROS,   Endo/Other  diabetes  Renal/GU negative Renal ROS     Musculoskeletal  (+) Arthritis , Osteoarthritis,    Abdominal   Peds  Hematology negative hematology ROS (+)   Anesthesia Other Findings   Reproductive/Obstetrics negative OB ROS                             Anesthesia Physical  Anesthesia Plan  ASA: II  Anesthesia Plan: General   Post-op Pain Management:    Induction: Intravenous  PONV Risk Score and Plan: 4 or greater and Ondansetron, Dexamethasone, Midazolam, Scopolamine patch - Pre-op and Propofol infusion  Airway Management Planned: LMA  Additional Equipment:   Intra-op Plan:   Post-operative Plan: Extubation in OR  Informed Consent: I have reviewed the patients History and Physical, chart, labs and discussed the procedure including the risks, benefits and alternatives for the proposed anesthesia with the patient or authorized representative who has indicated his/her understanding and acceptance.   Dental advisory given  Plan Discussed with: CRNA  Anesthesia Plan Comments:        Anesthesia Quick Evaluation

## 2017-06-15 NOTE — Discharge Instructions (Signed)
Discharge Instructions ° ° °You have a dressing with a plaster splint incorporated in it. °Move your fingers as much as possible, making a full fist and fully opening the fist. °Elevate your hand to reduce pain & swelling of the digits.  Ice over the operative site may be helpful to reduce pain & swelling.  DO NOT USE HEAT. °Leave the dressing in place until you return to our office.  °You may shower, but keep the bandage clean & dry.  °You may drive a car when you are off of prescription pain medications and can safely control your vehicle with both hands. ° ° °Please call [phone] during normal business hours or [phone] after hours for any problems. Including the following: ° °- excessive redness of the incisions °- drainage for more than 4 days °- fever of more than 101.5 F ° °*Please note that pain medications will not be refilled after hours or on weekends. ° °

## 2017-06-15 NOTE — ED Notes (Signed)
Bleeding is under control at this time, pt has multiple areas on hand and wrist with layer of skin missing.

## 2017-06-15 NOTE — ED Notes (Signed)
Offered to remove patient's clothing, denied at this time. Updated on transfer status, family and patient agreeable with plan.

## 2017-06-15 NOTE — ED Notes (Signed)
Pt transported to CT ?

## 2017-06-15 NOTE — H&P (Signed)
ORTHOPAEDIC CONSULTATION HISTORY & PHYSICAL REQUESTING PHYSICIAN: Milly Jakob, MD  Chief Complaint: right wrist injury HPI: Gabriela [name] is a 71 y.o. female who injured her right wrist in a wood splitter this AM.  Originally evaluated at [facility], and transferred to [facility] for further evaluation.  She received CT scan prior to transfer.  She denies pain elsewhere, and reports no significant numbness or tingling.  Past Medical History:  Diagnosis Date  . Arthritis   . Chronic pain    from fall 2012  . Diabetes mellitus without complication (HCC)    diet controlled  . History of closed head injury 2012   accident work threw her 54ft  . Hyperlipemia   . Urinary incontinence    wears a pessery   Past Surgical History:  Procedure Laterality Date  . ABDOMINAL HYSTERECTOMY    . BREAST LUMPECTOMY  1999   left  . COLONOSCOPY    . RADIOACTIVE SEED GUIDED MASTECTOMY WITH AXILLARY SENTINEL LYMPH NODE BIOPSY Left 02/08/2015   Procedure: RADIOACTIVE SEED LOCALIZATION LUMPECTOMY WITH LEFT AXILLARY SENTINEL LYMPH NODE BIOPSY;  Surgeon: Excell Seltzer, MD;  Location: [place];  Service: General;  Laterality: Left;  . SHOULDER ARTHROSCOPY  2012   left  . TONSILLECTOMY     Social History   Social History  . Marital status: Married    Spouse name: N/A  . Number of children: N/A  . Years of education: N/A   Social History Main Topics  . Smoking status: Never Smoker  . Smokeless tobacco: Never Used  . Alcohol use No  . Drug use: No  . Sexual activity: Not on file   Other Topics Concern  . Not on file   Social History Narrative  . No narrative on file   No family history on file. Allergies  Allergen Reactions  . Oxycontin [Oxycodone Hcl] Shortness Of Breath  . Sulfa Antibiotics Itching   Prior to Admission medications   Medication Sig Start Date End Date Taking? Authorizing Provider  ALPHA LIPOIC ACID PO Take by mouth. Reported on  12/03/2015    [provider]  amoxicillin-clavulanate (AUGMENTIN) 875-125 MG tablet Take 1 tablet by mouth 2 (two) times daily. 06/15/17   Delman Kitten, MD  B-COMPLEX-C PO Take by mouth.    [provider]  cetirizine (ZYRTEC) 10 MG tablet Take 10 mg by mouth.    [provider]  Cholecalciferol (VITAMIN D) 2000 units tablet Take by mouth.    [provider]  Garlic 3016 MG CAPS Take by mouth.    [provider]  glucose blood (ONE TOUCH ULTRA TEST) test strip Use once daily. Dx code: E11.9 Onr touch Ultra blue 06/04/17   Philemon Kingdom, MD  ibuprofen (ADVIL,MOTRIN) 600 MG tablet TK 1 T PO Q 6 H PRF MODERATE PAIN 10/11/15   [provider]  metFORMIN (GLUCOPHAGE-XR) 500 MG 24 hr tablet Take 1 tablet (500 mg total) by mouth 2 (two) times daily. 05/16/17   Philemon Kingdom, MD  Omega-3 1000 MG CAPS Take by mouth.    [provider]  Jonetta Speak LANCETS 01U MISC Use to check sugar daily 06/04/17   Philemon Kingdom, MD  tamoxifen (NOLVADEX) 20 MG tablet TAKE 1 TABLET BY MOUTH  DAILY 06/13/17   Magrinat, Virgie Dad, MD  traMADol Veatrice Bourbon) 50 MG tablet Reported on 12/03/2015 04/01/14   [provider]   Dg Hand Complete Right  Result Date: 06/15/2017 CLINICAL DATA:  Right hand  injury with wood splitter. EXAM: RIGHT HAND - COMPLETE 3+ VIEW COMPARISON:  None. FINDINGS: There is a fracture of the volar dislocation of the capitate. Remaining carpal alignment appears preserved. Multiple soft tissue lacerations are noted about the wrist. IMPRESSION: Fracture and volar dislocation of the capitate. Electronically Signed   By: Titus Dubin M.D.   On: 06/15/2017 09:10    Positive ROS: All other systems have been reviewed and were otherwise negative with the exception of those mentioned in the HPI and as above.  Physical Exam: Vitals: Refer to EMR. Constitutional:  WD, WN, NAD HEENT:  NCAT, EOMI Neuro/Psych:  Alert & oriented to person,  place, and time; appropriate mood & affect Lymphatic: No generalized extremity edema or lymphadenopathy Extremities / MSK:  The extremities are normal with respect to appearance, ranges of motion, joint stability, muscle strength/tone, sensation, & perfusion except as otherwise noted:  The bandages on the right wrist are removed.  There is a long linear laceration on the volar radial aspect of the distal forearm coursing about the base of the thumb.  It is unclear whether this is an avulsion, or simply retraction of the laceration.  It only appears to have split the skin.  There is another wound on the dorsum of the wrist that is similar.  Subcutaneous structures are noted.  There is intact light touch sensibility on the radial, median, and ulnar nerve distributions, with intact motor to the same.  She has full digital flexion and extension, can flex the thumb IP joint and extend the thumb.  The wrist is held slightly flexed.  The digits are cool slightly, but with adequate capillary refill.  Radial pulses palpable.  Assessment: Crush injury to right wrist, with divergent carpometacarpal injury and volar dislocation of the capitate the other intercarpal ligaments are difficult to assess.  Plan: I discussed these findings with her I discussed the need to proceed to the operating room when an operative theater becomes available, in order to better evaluate the extent of the injury to the soft tissues, as well as the carpal injury itself.  I indicated that I anticipated washing this copiously, and then reducing the carpal and CMC injuries, likely securing the reduction with pinning.  In addition, the soft tissues will need to be appropriately dealt with, either through primary closure or possibly wound VAC with delayed coverage.  Questions were invited and answered.  Informed consent obtained.  Document executed.  She has already received Ancef at the transferring facility, we will continue such here, likely  completing 24 hours of antibiotic therapy prior to discharge.  Rayvon Char Grand[place] Silos, [address] [place], [place]  [address] Office: [phone] Mobile: [phone]  06/15/2017, 11:45 AM

## 2017-06-15 NOTE — Transfer of Care (Signed)
Immediate Anesthesia Transfer of Care Note  Patient: Gabriela [name]  Procedure(s) Performed: Procedure(s): OPEN REDUCTION INTERNAL FIXATION (ORIF) WRIST FRACTURE WITH REDUCTION AND PINNINGT OF CARPAL DISLOCATION (Right)  Patient Location: PACU  Anesthesia Type:General  Level of Consciousness: awake, alert , oriented and patient cooperative  Airway & Oxygen Therapy: Patient Spontanous Breathing  Post-op Assessment: Report given to RN, Post -op Vital signs reviewed and stable and Patient moving all extremities X 4  Post vital signs: Reviewed and stable  Last Vitals:  Vitals:   06/15/17 1323 06/15/17 1325  BP:  (!) 190/71  Pulse:  63  Resp:  18  Temp:  37.4 C  SpO2: 95% 98%    Last Pain:  Vitals:   06/15/17 1517  TempSrc:   PainSc: 9          Complications: No apparent anesthesia complications

## 2017-06-15 NOTE — ED Notes (Signed)
MD Grand[place] Silos at bedside speaking with pt about procedure. Consent signed by MD. Will have pt. Sign.

## 2017-06-15 NOTE — Anesthesia Procedure Notes (Signed)
Procedure Name: LMA Insertion Date/Time: 06/15/2017 3:49 PM Performed by: Candis Shine Pre-anesthesia Checklist: Patient identified, Emergency Drugs available, Suction available and Patient being monitored Patient Re-evaluated:Patient Re-evaluated prior to induction Oxygen Delivery Method: Circle System Utilized Preoxygenation: Pre-oxygenation with 100% oxygen Induction Type: IV induction Ventilation: Mask ventilation without difficulty LMA: LMA inserted LMA Size: 4.0 Number of attempts: 1 Placement Confirmation: positive ETCO2 Tube secured with: Tape Dental Injury: Teeth and Oropharynx as per pre-operative assessment

## 2017-06-16 NOTE — Care Management Obs Status (Signed)
MEDICARE OBSERVATION STATUS NOTIFICATION   Patient Details  Name: Gabriela [name] MRN: [id number] Date of Birth: 16-Nov-1945   Medicare Observation Status Notification Given:  Yes Patient unable to sign d/t surgery on right wrist. Husband signed .   Ninfa Meeker, RN 06/16/2017, 10:11 AM

## 2017-06-16 NOTE — Plan of Care (Signed)
Problem: Safety: Goal: Ability to remain free from injury will improve Outcome: Progressing Pt is in Room with call bell in reach, bed at low position, and husband by the bedside. Explained to pt about not getting out of be until she calls for help. Both husband and Pt understood. Will cont. To mont. With hourly rounds.

## 2017-06-16 NOTE — Discharge Summary (Signed)
Physician Discharge Summary  Patient ID: Gabriela [name] MRN: [id number] DOB/AGE: 70-Apr-1947 70 y.o.  Admit date: 06/15/2017 Discharge date: 06/16/2017  Admission Diagnoses:  Crush injury right wrist  Discharge Diagnoses:  Active Problems:   Closed dislocation of carpometacarpal joint of right wrist   Past Medical History:  Diagnosis Date  . Arthritis   . Chronic pain    from fall 2012  . Diabetes mellitus without complication (HCC)    diet controlled  . History of closed head injury 2012   accident work threw her 61ft  . Hyperlipemia   . Urinary incontinence    wears a pessery    Surgeries: Procedure(s): OPEN REDUCTION INTERNAL FIXATION (ORIF) WRIST FRACTURE WITH REDUCTION AND PINNINGT OF CARPAL DISLOCATION on 06/15/2017   Consultants (if any):   Discharged Condition: Improved  Hospital Course: Gabriela [name] is an 71 y.o. female who was admitted 06/15/2017 with a diagnosis of right wrist crush injury and went to the operating room on 06/15/2017 and underwent the above named procedures.  She was kept overnight for observation and antibiotic administration.  She was given perioperative antibiotics:  Anti-infectives    Start     Dose/Rate Route Frequency Ordered Stop   06/16/17 0600  ceFAZolin (ANCEF) IVPB 2g/100 mL premix     2 g 200 mL/hr over 30 Minutes Intravenous On call to O.R. 06/15/17 1402 06/16/17 0630   06/16/17 0600  ceFAZolin (ANCEF) IVPB 1 g/50 mL premix  Status:  Discontinued     1 g 100 mL/hr over 30 Minutes Intravenous Every 8 hours 06/15/17 2015 06/15/17 2055   06/16/17 0245  ceFAZolin (ANCEF) IVPB 1 g/50 mL premix  Status:  Discontinued     1 g 100 mL/hr over 30 Minutes Intravenous Every 8 hours 06/15/17 2015 06/15/17 2018   06/15/17 2200  ceFAZolin (ANCEF) IVPB 1 g/50 mL premix     1 g 100 mL/hr over 30 Minutes Intravenous Every 8 hours 06/15/17 2055 06/16/17 0542   06/15/17 0000  cephALEXin (KEFLEX) 500 MG capsule     500 mg Oral 4  times daily 06/15/17 1844 06/22/17 2359    .  She was given sequential compression devices & early ambulation for DVT prophylaxis.  She benefited maximally from the hospital stay and there were no complications.    Recent vital signs:  Vitals:   06/16/17 0500 06/16/17 0915  BP: (!) 108/50 (!) 112/48  Pulse: 61 63  Resp: 12 15  Temp: 98.8 F (37.1 C) 98.2 F (36.8 C)  SpO2: 97% 95%    Recent laboratory studies:  Lab Results  Component Value Date   HGB 12.1 06/15/2017   HGB 12.4 02/20/2017   HGB 12.6 07/19/2016   Lab Results  Component Value Date   WBC 6.3 06/15/2017   PLT 238 06/15/2017   Lab Results  Component Value Date   INR 0.95 06/15/2017   Lab Results  Component Value Date   NA 138 06/15/2017   K 3.8 06/15/2017   CL 103 06/15/2017   CO2 28 06/15/2017   BUN 15 06/15/2017   CREATININE 0.70 06/15/2017   GLUCOSE 150 (H) 06/15/2017    Discharge Medications:   Allergies as of 06/16/2017      Reactions   Oxycontin [oxycodone Hcl] Shortness Of Breath   Sulfa Antibiotics Itching      Medication List    STOP taking these medications   amoxicillin-clavulanate 875-125 MG tablet Commonly known as:  AUGMENTIN   ibuprofen 600 MG  tablet Commonly known as:  ADVIL,MOTRIN     TAKE these medications   ALPHA LIPOIC ACID PO Take by mouth. Reported on 12/03/2015   B-COMPLEX-C PO Take by mouth.   cephALEXin 500 MG capsule Commonly known as:  KEFLEX Take 1 capsule (500 mg total) by mouth 4 (four) times daily.   cetirizine 10 MG tablet Commonly known as:  ZYRTEC Take 10 mg by mouth.   Garlic 7741 MG Caps Take by mouth.   glucose blood test strip Commonly known as:  ONE TOUCH ULTRA TEST Use once daily. Dx code: E11.9 Onr touch Ultra blue   HYDROcodone-acetaminophen 5-325 MG tablet Commonly known as:  NORCO Take 1-2 tablets by mouth every 6 (six) hours as needed for moderate pain or severe pain.   meloxicam 15 MG tablet Commonly known as:  MOBIC Take  1 tablet (15 mg total) by mouth daily.   metFORMIN 500 MG 24 hr tablet Commonly known as:  GLUCOPHAGE-XR Take 1 tablet (500 mg total) by mouth 2 (two) times daily.   Omega-3 1000 MG Caps Take by mouth.   ONETOUCH DELICA LANCETS 28N Misc Use to check sugar daily   tamoxifen 20 MG tablet Commonly known as:  NOLVADEX TAKE 1 TABLET BY MOUTH  DAILY   traMADol 50 MG tablet Commonly known as:  ULTRAM Reported on 12/03/2015   Vitamin D 2000 units tablet Take by mouth.       Diagnostic Studies: Dg Wrist Complete Right  Result Date: 06/15/2017 CLINICAL DATA:  ORIF of right carpal fracture dislocation. EXAM: DG C-ARM 61-120 MIN; RIGHT WRIST - COMPLETE 3+ VIEW COMPARISON:  Radiographs and CT earlier today. FINDINGS: Two K-wires extend through the capitate and into the bases of the third and fourth metacarpals. Transverse K-wire also present across the base of the second through fourth metacarpals. Fourth K-wire is present across the proximal carpal row obtaining the triquetrum and lunate. Alignment appears grossly anatomic. IMPRESSION: ORIF of capitate dislocation with K-wire placement, as above. Alignment appears grossly anatomic. Electronically Signed   By: Aletta Edouard M.D.   On: 06/15/2017 19:56   Ct Wrist Right Wo Contrast  Result Date: 06/15/2017 CLINICAL DATA:  Right wrist injury with wood splitter. EXAM: CT OF THE RIGHT WRIST WITHOUT CONTRAST TECHNIQUE: Multidetector CT imaging of the right wrist was performed according to the standard protocol. Multiplanar CT image reconstructions were also generated. COMPARISON:  Right wrist x-rays from same date. FINDINGS: Bones/Joint/Cartilage Perilunate dislocation with volar dislocation of the capitate. Dorsal irregularity of the capitate, consider MRI fracture after a. There is a highly comminuted, displaced fracture of the base of the third metacarpal. Multiple bone fragments are seen volar to the dislocated capitate and at the base of the third  metacarpal. There is a minimally displaced fracture at the volar base of the trapezium. No definite scaphoid fracture. Increased scapholunate angle, measuring 86 degrees. Radiocarpal and midcarpal joint effusions. Ligaments Suboptimally assessed by CT. Muscles and Tendons The fourth extensor tendons are somewhat attenuated and poorly seen. The remaining flexor and extensor tendons are grossly intact. Fluid around the flexor tendons in the carpal tunnel. Stop Soft tissues Diffuse subcutaneous emphysema and soft tissue swelling within the volar and dorsal wrist with multiple lacerations over the dorsal soft tissues. IMPRESSION: 1. Perilunate fracture dislocation with volar dislocation of the capitate and highly comminuted displaced fracture at the base of third metacarpal. Dorsal irregularity of the capitate, concerning for fracture. 2. Minimally displaced fracture at the volar base of the trapezium.  3. No definite scaphoid fracture. Widening of the scapholunate angle concerning for scapholunate lunate ligament injury. 4. Extensive soft tissue emphysema in the volar and dorsal wrist with multiple lacerations seen over the dorsal wrist. 5. Attenuation of the fourth extensor tendon. Correlate clinically for possible tendon injury. Electronically Signed   By: Titus Dubin M.D.   On: 06/15/2017 11:59   Dg Hand Complete Right  Result Date: 06/15/2017 CLINICAL DATA:  Right hand injury with wood splitter. EXAM: RIGHT HAND - COMPLETE 3+ VIEW COMPARISON:  None. FINDINGS: There is a fracture of the volar dislocation of the capitate. Remaining carpal alignment appears preserved. Multiple soft tissue lacerations are noted about the wrist. IMPRESSION: Fracture and volar dislocation of the capitate. Electronically Signed   By: Titus Dubin M.D.   On: 06/15/2017 09:10   Dg C-arm 61-120 Min  Result Date: 06/15/2017 CLINICAL DATA:  ORIF of right carpal fracture dislocation. EXAM: DG C-ARM 61-120 MIN; RIGHT WRIST -  COMPLETE 3+ VIEW COMPARISON:  Radiographs and CT earlier today. FINDINGS: Two K-wires extend through the capitate and into the bases of the third and fourth metacarpals. Transverse K-wire also present across the base of the second through fourth metacarpals. Fourth K-wire is present across the proximal carpal row obtaining the triquetrum and lunate. Alignment appears grossly anatomic. IMPRESSION: ORIF of capitate dislocation with K-wire placement, as above. Alignment appears grossly anatomic. Electronically Signed   By: Aletta Edouard M.D.   On: 06/15/2017 19:56    Disposition: 02-Transferred to [facility]    Follow-up Information    Schedule an appointment as soon as possible for a visit with Milly Jakob, MD.   Specialty:  Orthopedic Surgery Why:  for early in week of 06-25-17 Contact information: [place] [address] [phone]            Signed: Grandville Silos, Billy Rocco A. 06/16/2017, 12:07 PM

## 2017-06-16 NOTE — Progress Notes (Signed)
Pt ready for discharge. Education/instructions reviewed with pt and family, and all questions/concerns addressed. IV removed and belongings gathered. Pt declined a wheelchair and will ambulate out to family members vehicle. Will continue to monitor

## 2017-06-16 NOTE — Progress Notes (Signed)
POD 1 Sitting up, doing well, tol PO, c/o minimal pain Hand remarkably not swollen, full digital F/E, intact LT sens R/M/U and intact motor Digits warm, cr brisk  D/C home F/u 7-10 days

## 2017-06-16 NOTE — Anesthesia Postprocedure Evaluation (Signed)
Anesthesia Post Note  Patient: Gabriela [name]  Procedure(s) Performed: Procedure(s) (LRB): OPEN REDUCTION INTERNAL FIXATION (ORIF) WRIST FRACTURE WITH REDUCTION AND PINNINGT OF CARPAL DISLOCATION (Right)     Patient location during evaluation: PACU Anesthesia Type: Regional Level of consciousness: awake and alert Pain management: pain level controlled Vital Signs Assessment: post-procedure vital signs reviewed and stable Respiratory status: spontaneous breathing, nonlabored ventilation, respiratory function stable and patient connected to nasal cannula oxygen Cardiovascular status: blood pressure returned to baseline and stable Postop Assessment: no signs of nausea or vomiting Anesthetic complications: no    Last Vitals:  Vitals:   06/15/17 1945 06/16/17 0100  BP: 134/64 (!) 114/51  Pulse: 80 77  Resp: 12 12  Temp: 36.6 C 36.7 C  SpO2: 94% 93%    Last Pain:  Vitals:   06/16/17 0100  TempSrc: Oral  PainSc:                  Javier Gell DAVID

## 2017-06-18 ENCOUNTER — Encounter (HOSPITAL_COMMUNITY): Payer: Self-pay | Admitting: Orthopedic Surgery

## 2017-06-20 ENCOUNTER — Encounter (HOSPITAL_COMMUNITY): Payer: Self-pay | Admitting: Orthopedic Surgery

## 2017-06-20 NOTE — Addendum Note (Signed)
Addendum  created 06/20/17 1857 by Lillia Abed, MD   SmartForm saved

## 2017-06-29 ENCOUNTER — Ambulatory Visit: Payer: Self-pay | Admitting: Radiation Oncology

## 2017-07-06 ENCOUNTER — Ambulatory Visit: Payer: Medicare Other | Admitting: Radiation Oncology

## 2017-07-06 DIAGNOSIS — M25639 Stiffness of unspecified wrist, not elsewhere classified: Secondary | ICD-10-CM | POA: Insufficient documentation

## 2017-08-23 DIAGNOSIS — S63004D Unspecified dislocation of right wrist and hand, subsequent encounter: Secondary | ICD-10-CM | POA: Diagnosis not present

## 2017-08-23 DIAGNOSIS — S61411D Laceration without foreign body of right hand, subsequent encounter: Secondary | ICD-10-CM | POA: Diagnosis not present

## 2017-09-19 ENCOUNTER — Encounter: Payer: Self-pay | Admitting: Family Medicine

## 2017-09-19 ENCOUNTER — Ambulatory Visit (INDEPENDENT_AMBULATORY_CARE_PROVIDER_SITE_OTHER): Payer: Medicare Other | Admitting: Family Medicine

## 2017-09-19 VITALS — BP 124/70 | HR 58 | Temp 98.1°F | Ht 67.0 in | Wt 180.5 lb

## 2017-09-19 DIAGNOSIS — E119 Type 2 diabetes mellitus without complications: Secondary | ICD-10-CM

## 2017-09-19 DIAGNOSIS — Z7689 Persons encountering health services in other specified circumstances: Secondary | ICD-10-CM

## 2017-09-19 LAB — LIPID PANEL
CHOL/HDL RATIO: 3
Cholesterol: 169 mg/dL (ref 0–200)
HDL: 48.4 mg/dL (ref >39.00)
LDL CALC: 102 mg/dL — AB (ref 0–99)
NONHDL: 120.39
TRIGLYCERIDES: 92 mg/dL (ref 0.0–149.0)
VLDL: 18.4 mg/dL (ref 0.0–40.0)

## 2017-09-19 NOTE — Patient Instructions (Signed)
It was a pleasure to meet you today! I look forward to partnering with you for your health care needs   Please make an appointment for your annual exam in 6 months (AWV/CPE)

## 2017-09-19 NOTE — Progress Notes (Signed)
   Subjective:    Patient ID: Gabriela [name], female    DOB: 18-Feb-1946, 71 y.o.   MRN: [id number]  HPI This is a 71 yo female who presents today to establish care. She is married.  She enjoys staying busy, keeping house, cooking and canning.   She has a history of diabetes and sees Dr. Renne Crigler, she has a history of breast cancer and sees Dr. Jana Hakim annually.  Last hemoglobin A1c 05-16-2017 was 5.9.  Last CPE- about 1.5 years ago Mammo- 02/2017 Pap- hysterectomy Colonoscopy- unsure, will look at records in care everywhere Tdap- 06/15/17 Flu- annual Eye- annual Dental- regular Exercise- not regular, but does her own housework and yardwork    Past Medical History:  Diagnosis Date  . Arthritis   . Chronic pain    from fall 2012  . Diabetes mellitus without complication (HCC)    diet controlled  . History of closed head injury 2012   accident work threw her 10ft  . Hyperlipemia   . Urinary incontinence    wears a pessery   Past Surgical History:  Procedure Laterality Date  . ABDOMINAL HYSTERECTOMY    . BREAST LUMPECTOMY  1999   left  . COLONOSCOPY    . SHOULDER ARTHROSCOPY  2012   left  . TONSILLECTOMY     No family history on file.    Review of Systems Per HPI    Objective:   Physical Exam Physical Exam  Constitutional: Oriented to person, place, and time. He appears well-developed and well-nourished.  HENT:  Head: Normocephalic and atraumatic.  Eyes: Conjunctivae are normal.  Neck: Normal range of motion. Neck supple.  Cardiovascular: Normal rate, regular rhythm and normal heart sounds.   Pulmonary/Chest: Effort normal and breath sounds normal.  Musculoskeletal: Normal range of motion.  Neurological: Alert and oriented to person, place, and time.  Skin: Skin is warm and dry.  Psychiatric: Normal mood and affect. Behavior is normal. Judgment and thought content normal.  Vitals reviewed.        BP 124/70 (BP Location: Right Arm, Patient  Position: Sitting, Cuff Size: Normal)   Pulse (!) 58   Temp 98.1 F (36.7 C) (Oral)   Ht 5\' 7"  (1.702 m)   Wt 180 lb 8 oz (81.9 kg)   SpO2 99%   BMI 28.27 kg/m  Wt Readings from Last 3 Encounters:  09/19/17 180 lb 8 oz (81.9 kg)  06/15/17 177 lb (80.3 kg)  06/15/17 177 lb (80.3 kg)    Assessment & Plan:  1. Encounter to establish care - Discussed and encouraged healthy lifestyle choices- adequate sleep, regular exercise, stress management and healthy food choices.  - follow up in 6 months for CPE/AWV  2. Diabetes mellitus type II, non insulin dependent ([place]) - Lipid panel   Clarene Reamer, FNP-BC  [facility], [place]  09/23/2017 4:45 PM

## 2017-09-23 ENCOUNTER — Encounter: Payer: Self-pay | Admitting: Family Medicine

## 2017-11-16 ENCOUNTER — Encounter: Payer: Self-pay | Admitting: Internal Medicine

## 2017-11-16 ENCOUNTER — Ambulatory Visit: Payer: Medicare Other | Admitting: Internal Medicine

## 2017-11-16 VITALS — BP 122/80 | HR 66 | Ht 67.0 in | Wt 184.0 lb

## 2017-11-16 DIAGNOSIS — E119 Type 2 diabetes mellitus without complications: Secondary | ICD-10-CM | POA: Diagnosis not present

## 2017-11-16 DIAGNOSIS — E785 Hyperlipidemia, unspecified: Secondary | ICD-10-CM | POA: Diagnosis not present

## 2017-11-16 LAB — POCT GLYCOSYLATED HEMOGLOBIN (HGB A1C): HEMOGLOBIN A1C: 6

## 2017-11-16 NOTE — Addendum Note (Signed)
Addended by: Drucilla Schmidt on: 11/16/2017 01:40 PM   Modules accepted: Orders

## 2017-11-16 NOTE — Patient Instructions (Signed)
Please continue: - Metformin ER 500 mg 2x a day with meals.  Check sugars once a day or every other day, rotating check times.  Please return in 6 months with your sugar log

## 2017-11-16 NOTE — Progress Notes (Signed)
Patient ID: Gabriela [name], female   DOB: 08/20/1946, 71 y.o.   MRN: [id number]   HPI: Gabriela [name] is a 71 y.o.-year-old female, returning for f/u for DM2, dx in 04/2014, non-insulin-dependent, controlled, without complications. Last visit 6 months ago.  She hurt her R hand cutting wood in 06/2017 >> had to have Sx.  She had a steroid inj in 05/2017.  Last hemoglobin A1c was: Lab Results  Component Value Date   HGBA1C 5.9 05/16/2017   HGBA1C 5.8 12/27/2016  08/08/2016: HbA1c 7.0% 02/04/2016: HbA1c 6.7% 05/12/2015: HbA1c 6.3% 11/17/2014: HbA1c 6.1% 08/07/2014: HbA1c 6.4%  Pt is on: - Metformin ER 500 mg 2x a day She could not tolerate Metformin IR: HA, reflux, borborygmi  Pt checks her sugars once a day: - am: 130s-160, 170 >> n/c  - 2h after b'fast: n/c >> 113-159 >> 91-137 >> 95-122, 130 - before lunch: n/c  - 2h after lunch: n/c >> 105-139 >> 87-123 >> 87-123 - before dinner: n/c - 2h after dinner: n/c >> 96-154 >> 95-119, 247 (steroids) >> 93-131 - bedtime: n/c - nighttime: n/c Lowest sugar was 120s >> 96 >> 87 >> 87;  Unclear if she has hypoglycemia awareness. Highest sugar was 170 >> 159 >> 247 (steroids) >> 180 (steroids)  Glucometer: One Touch Ultra  Pt's meals are: - Breakfast: multi-grain sandwich with egg salad - Lunch: may skip; multi-grain crackers with salad or homemade pimento cheese - Dinner: grilled or broiled meat and vegetable - Snacks: apple/pear; almonds/cashews  - No CKD, last BUN/creatinine:  Lab Results  Component Value Date   BUN 15 06/15/2017   BUN 14.7 02/20/2017   CREATININE 0.70 06/15/2017   CREATININE 0.9 02/20/2017   -+ HL;  last set of lipids: Lab Results  Component Value Date   CHOL 169 09/19/2017   HDL 48.40 09/19/2017   LDLCALC 102 (H) 09/19/2017   TRIG 92.0 09/19/2017   CHOLHDL 3 09/19/2017  02/04/2016: 181/84/43/121 She is not on a statin, but on omega-3 fatty acids (2x 1200 mg/day), and also  garlic. - last eye exam was 05/2017: No DR.  She has cataract in her left eye.   - No  numbness and tingling in her feet. She is on ALA + B complex.  She has a history of subdural hematoma 6 years ago after an accident when she was working at [employer]. Since then, she has hand tremors. In 2016 she has been dx'ed with BrCA and also had a total hysterectomy and a pelvic repair surgery.   ROS: Constitutional: no weight gain/no weight loss, no fatigue, no subjective hyperthermia, + subjective hypothermia Eyes: no blurry vision, no xerophthalmia ENT: no sore throat, no nodules palpated in throat, no dysphagia, no odynophagia, no hoarseness Cardiovascular: no CP/no SOB/no palpitations/no leg swelling Respiratory: no cough/no SOB/no wheezing Gastrointestinal: no N/no V/no D/no C/no acid reflux Musculoskeletal: no muscle aches/no joint aches Skin: no rashes, no hair loss Neurological: no tremors/no numbness/no tingling/no dizziness  I reviewed pt's medications, allergies, PMH, social hx, family hx, and changes were documented in the history of present illness. Otherwise, unchanged from my initial visit note.  Past Medical History:  Diagnosis Date  . Arthritis   . Cancer (HCC)    left breast  . Chronic pain    from fall 2012  . Diabetes mellitus without complication (HCC)    diet controlled  . History of closed head injury 2012   accident work threw her 15ft  . Hyperlipemia   .   Urinary incontinence    wears a pessery   Past Surgical History:  Procedure Laterality Date  . ABDOMINAL HYSTERECTOMY    . BREAST LUMPECTOMY  1999   left  . COLONOSCOPY    . ORIF WRIST FRACTURE Right 06/15/2017   Procedure: OPEN REDUCTION INTERNAL FIXATION (ORIF) WRIST FRACTURE WITH REDUCTION AND PINNINGT OF CARPAL DISLOCATION;  Surgeon: Thompson, David, MD;  Location: [facility] OR;  Service: Orthopedics;  Laterality: Right;  . RADIOACTIVE SEED GUIDED PARTIAL MASTECTOMY WITH AXILLARY SENTINEL LYMPH NODE BIOPSY Left  02/08/2015   Procedure: RADIOACTIVE SEED LOCALIZATION LUMPECTOMY WITH LEFT AXILLARY SENTINEL LYMPH NODE BIOPSY;  Surgeon: Benjamin Hoxworth, MD;  Location: [facility];  Service: General;  Laterality: Left;  . SHOULDER ARTHROSCOPY  2012   left  . TONSILLECTOMY     Social History   Social History  . Marital status: Married    Spouse name: N/A  . Number of children: 3   Occupational History  . retired   Social History Main Topics  . Smoking status: Never Smoker  . Smokeless tobacco: Never Used  . Alcohol use No  . Drug use: No   Current Outpatient Medications on File Prior to Visit  Medication Sig Dispense Refill  . ALPHA LIPOIC ACID PO Take by mouth. Reported on 12/03/2015    . Blood Glucose Monitoring Suppl (ONE TOUCH ULTRA 2) w/Device KIT USE AS DIRECTED    . cetirizine (ZYRTEC) 10 MG tablet Take 10 mg by mouth.    . Cholecalciferol (VITAMIN D) 2000 units tablet Take by mouth.    . gabapentin (NEURONTIN) 300 MG capsule Take by mouth.    . Garlic 1000 MG CAPS Take by mouth.    . glucose blood (ONE TOUCH ULTRA TEST) test strip Use once daily. Dx code: E11.9 Onr touch Ultra blue 100 each 5  . metFORMIN (GLUCOPHAGE-XR) 500 MG 24 hr tablet Take 1 tablet (500 mg total) by mouth 2 (two) times daily. 180 tablet 3  . Omega-3 1000 MG CAPS Take by mouth.    . ONETOUCH DELICA LANCETS 33G MISC Use to check sugar daily 100 each 5  . tamoxifen (NOLVADEX) 20 MG tablet TAKE 1 TABLET BY MOUTH  DAILY 90 tablet 3  . traMADol (ULTRAM) 50 MG tablet Take by mouth.     No current facility-administered medications on file prior to visit.    Allergies  Allergen Reactions  . Oxycontin [Oxycodone Hcl] Shortness Of Breath  . Sulfa Antibiotics Itching   Family history: - DM in sister, MGM.  PE: BP 122/80   Pulse 66   Ht 5' 7" (1.702 m)   Wt 184 lb (83.5 kg)   SpO2 97%   BMI 28.82 kg/m  Wt Readings from Last 3 Encounters:  11/16/17 184 lb (83.5 kg)  09/19/17 180 lb 8 oz (81.9  kg)  06/15/17 177 lb (80.3 kg)   Constitutional: overweight, in NAD Eyes: PERRLA, EOMI, no exophthalmos ENT: moist mucous membranes, no thyromegaly, no cervical lymphadenopathy Cardiovascular: RRR, No MRG Respiratory: CTA B Gastrointestinal: abdomen soft, NT, ND, BS+ Musculoskeletal: no deformities, strength intact in all 4 Skin: moist, warm, no rashes Neurological: no tremor with outstretched hands, DTR normal in all 4  ASSESSMENT: 1. DM2, non-insulin-dependent, controlled, without long-term complications  2. HL  PLAN:  1. Pt has controlled type 2 diabetes, on the low-dose of metformin ER.  Sugars are still very well controlled, except when she has to have a steroid injection.  She gets them in   the back, usually 6 months apart.  With last steroid injection, her sugars did not increase as much as before, they only went up to 180. - Reviewed her carefully kept log along with the patient.  Sugars are all at goal.  No need to change the regimen for now. - I suggested to:  Patient Instructions  Please continue: - Metformin ER 500 mg 2x a day with meals.  Check sugars once a day or every other day, rotating check times.  Please return in 6 months with your sugar log  - today, HbA1c is 6.0% (slighthly higher) - continue checking sugars at different times of the day - check 1x a day, rotating checks - advised for yearly eye exams >> she is UTD - Return to clinic in 6 mo with sugar log   2. HL - reviewed Lipid panel from 09/2017 - LDL slightly above target  - not on statin, but on omega-3 fatty acids (2x 1200 mg/day), and also garlic.  Philemon Kingdom, MD PhD [facility]

## 2017-12-27 ENCOUNTER — Other Ambulatory Visit: Payer: Self-pay | Admitting: Oncology

## 2017-12-27 DIAGNOSIS — Z853 Personal history of malignant neoplasm of breast: Secondary | ICD-10-CM

## 2018-01-20 ENCOUNTER — Other Ambulatory Visit: Payer: Self-pay | Admitting: Oncology

## 2018-02-07 ENCOUNTER — Ambulatory Visit
Admission: RE | Admit: 2018-02-07 | Discharge: 2018-02-07 | Disposition: A | Discharge status: Home / Self Care | Payer: Medicare Other | Source: Ambulatory Visit | Attending: Oncology | Admitting: Oncology

## 2018-02-07 ENCOUNTER — Other Ambulatory Visit: Payer: Self-pay | Admitting: Oncology

## 2018-02-07 DIAGNOSIS — Z853 Personal history of malignant neoplasm of breast: Secondary | ICD-10-CM

## 2018-02-07 DIAGNOSIS — N6489 Other specified disorders of breast: Secondary | ICD-10-CM

## 2018-02-07 DIAGNOSIS — N6321 Unspecified lump in the left breast, upper outer quadrant: Secondary | ICD-10-CM | POA: Diagnosis not present

## 2018-02-07 DIAGNOSIS — R928 Other abnormal and inconclusive findings on diagnostic imaging of breast: Secondary | ICD-10-CM | POA: Diagnosis not present

## 2018-02-07 HISTORY — DX: Personal history of irradiation: Z92.3

## 2018-02-08 ENCOUNTER — Ambulatory Visit
Admission: RE | Admit: 2018-02-08 | Discharge: 2018-02-08 | Disposition: A | Discharge status: Home / Self Care | Payer: Medicare Other | Source: Ambulatory Visit | Attending: Oncology | Admitting: Oncology

## 2018-02-08 ENCOUNTER — Other Ambulatory Visit: Payer: Self-pay | Admitting: Oncology

## 2018-02-08 DIAGNOSIS — N6489 Other specified disorders of breast: Secondary | ICD-10-CM

## 2018-02-08 DIAGNOSIS — R928 Other abnormal and inconclusive findings on diagnostic imaging of breast: Secondary | ICD-10-CM | POA: Diagnosis not present

## 2018-02-08 DIAGNOSIS — N6321 Unspecified lump in the left breast, upper outer quadrant: Secondary | ICD-10-CM | POA: Diagnosis not present

## 2018-02-08 DIAGNOSIS — N6032 Fibrosclerosis of left breast: Secondary | ICD-10-CM | POA: Diagnosis not present

## 2018-02-21 ENCOUNTER — Ambulatory Visit: Payer: Medicare Other | Admitting: Oncology

## 2018-02-21 ENCOUNTER — Other Ambulatory Visit: Payer: Medicare Other

## 2018-02-26 ENCOUNTER — Ambulatory Visit: Payer: Self-pay | Admitting: General Surgery

## 2018-02-26 DIAGNOSIS — N6320 Unspecified lump in the left breast, unspecified quadrant: Secondary | ICD-10-CM

## 2018-03-01 ENCOUNTER — Other Ambulatory Visit: Payer: Self-pay

## 2018-03-01 ENCOUNTER — Encounter (HOSPITAL_BASED_OUTPATIENT_CLINIC_OR_DEPARTMENT_OTHER): Payer: Self-pay | Admitting: *Deleted

## 2018-03-01 ENCOUNTER — Other Ambulatory Visit: Payer: Self-pay | Admitting: General Surgery

## 2018-03-01 DIAGNOSIS — N6320 Unspecified lump in the left breast, unspecified quadrant: Secondary | ICD-10-CM

## 2018-03-04 ENCOUNTER — Encounter (HOSPITAL_BASED_OUTPATIENT_CLINIC_OR_DEPARTMENT_OTHER)
Admission: RE | Admit: 2018-03-04 | Discharge: 2018-03-04 | Disposition: A | Discharge status: Home / Self Care | Payer: Medicare Other | Source: Ambulatory Visit | Attending: General Surgery | Admitting: General Surgery

## 2018-03-04 DIAGNOSIS — Z882 Allergy status to sulfonamides status: Secondary | ICD-10-CM | POA: Diagnosis not present

## 2018-03-04 DIAGNOSIS — Z79899 Other long term (current) drug therapy: Secondary | ICD-10-CM | POA: Diagnosis not present

## 2018-03-04 DIAGNOSIS — Z803 Family history of malignant neoplasm of breast: Secondary | ICD-10-CM | POA: Diagnosis not present

## 2018-03-04 DIAGNOSIS — N6032 Fibrosclerosis of left breast: Secondary | ICD-10-CM | POA: Diagnosis not present

## 2018-03-04 DIAGNOSIS — Z7984 Long term (current) use of oral hypoglycemic drugs: Secondary | ICD-10-CM | POA: Diagnosis not present

## 2018-03-04 DIAGNOSIS — Z885 Allergy status to narcotic agent status: Secondary | ICD-10-CM | POA: Diagnosis not present

## 2018-03-04 DIAGNOSIS — N6320 Unspecified lump in the left breast, unspecified quadrant: Secondary | ICD-10-CM | POA: Diagnosis present

## 2018-03-04 DIAGNOSIS — E119 Type 2 diabetes mellitus without complications: Secondary | ICD-10-CM | POA: Diagnosis not present

## 2018-03-04 DIAGNOSIS — Z853 Personal history of malignant neoplasm of breast: Secondary | ICD-10-CM | POA: Diagnosis not present

## 2018-03-04 DIAGNOSIS — Z923 Personal history of irradiation: Secondary | ICD-10-CM | POA: Diagnosis not present

## 2018-03-04 LAB — BASIC METABOLIC PANEL
Anion gap: 9 (ref 5–15)
BUN: 12 mg/dL (ref 6–20)
CO2: 28 mmol/L (ref 22–32)
Calcium: 9.3 mg/dL (ref 8.9–10.3)
Chloride: 100 mmol/L — ABNORMAL LOW (ref 101–111)
Creatinine, Ser: 0.74 mg/dL (ref 0.44–1.00)
GFR calc Af Amer: >60 mL/min (ref >60)
GLUCOSE: 112 mg/dL — AB (ref 65–99)
POTASSIUM: 5.1 mmol/L (ref 3.5–5.1)
Sodium: 137 mmol/L (ref 135–145)

## 2018-03-04 NOTE — Progress Notes (Signed)
Pt arrived for blood work. Given ensure presurgery drink per ERAS protocol. Pt instructed to drink on DOS by 0545. Pt verbalized understanding.

## 2018-03-07 ENCOUNTER — Ambulatory Visit
Admission: RE | Admit: 2018-03-07 | Discharge: 2018-03-07 | Disposition: A | Discharge status: Home / Self Care | Payer: Medicare Other | Source: Ambulatory Visit | Attending: General Surgery | Admitting: General Surgery

## 2018-03-07 DIAGNOSIS — N6489 Other specified disorders of breast: Secondary | ICD-10-CM | POA: Diagnosis not present

## 2018-03-07 DIAGNOSIS — N6320 Unspecified lump in the left breast, unspecified quadrant: Secondary | ICD-10-CM

## 2018-03-08 ENCOUNTER — Ambulatory Visit (HOSPITAL_BASED_OUTPATIENT_CLINIC_OR_DEPARTMENT_OTHER): Payer: Medicare Other | Admitting: Anesthesiology

## 2018-03-08 ENCOUNTER — Ambulatory Visit
Admission: RE | Admit: 2018-03-08 | Discharge: 2018-03-08 | Disposition: A | Discharge status: Home / Self Care | Payer: Medicare Other | Source: Ambulatory Visit | Attending: General Surgery | Admitting: General Surgery

## 2018-03-08 ENCOUNTER — Encounter (HOSPITAL_BASED_OUTPATIENT_CLINIC_OR_DEPARTMENT_OTHER)
Admission: RE | Disposition: A | Discharge status: Home / Self Care | Payer: Self-pay | Source: Ambulatory Visit | Attending: General Surgery

## 2018-03-08 ENCOUNTER — Encounter (HOSPITAL_BASED_OUTPATIENT_CLINIC_OR_DEPARTMENT_OTHER): Payer: Self-pay

## 2018-03-08 ENCOUNTER — Other Ambulatory Visit: Payer: Self-pay

## 2018-03-08 ENCOUNTER — Ambulatory Visit (HOSPITAL_BASED_OUTPATIENT_CLINIC_OR_DEPARTMENT_OTHER)
Admission: RE | Admit: 2018-03-08 | Discharge: 2018-03-08 | Disposition: A | Discharge status: Home / Self Care | Payer: Medicare Other | Source: Ambulatory Visit | Attending: General Surgery | Admitting: General Surgery

## 2018-03-08 DIAGNOSIS — Z923 Personal history of irradiation: Secondary | ICD-10-CM | POA: Diagnosis not present

## 2018-03-08 DIAGNOSIS — C50512 Malignant neoplasm of lower-outer quadrant of left female breast: Secondary | ICD-10-CM | POA: Diagnosis not present

## 2018-03-08 DIAGNOSIS — N6489 Other specified disorders of breast: Secondary | ICD-10-CM | POA: Diagnosis not present

## 2018-03-08 DIAGNOSIS — Z7984 Long term (current) use of oral hypoglycemic drugs: Secondary | ICD-10-CM | POA: Insufficient documentation

## 2018-03-08 DIAGNOSIS — Z79899 Other long term (current) drug therapy: Secondary | ICD-10-CM | POA: Insufficient documentation

## 2018-03-08 DIAGNOSIS — E559 Vitamin D deficiency, unspecified: Secondary | ICD-10-CM | POA: Diagnosis not present

## 2018-03-08 DIAGNOSIS — E119 Type 2 diabetes mellitus without complications: Secondary | ICD-10-CM | POA: Diagnosis not present

## 2018-03-08 DIAGNOSIS — Z853 Personal history of malignant neoplasm of breast: Secondary | ICD-10-CM | POA: Diagnosis not present

## 2018-03-08 DIAGNOSIS — C50412 Malignant neoplasm of upper-outer quadrant of left female breast: Secondary | ICD-10-CM | POA: Diagnosis not present

## 2018-03-08 DIAGNOSIS — Z882 Allergy status to sulfonamides status: Secondary | ICD-10-CM | POA: Diagnosis not present

## 2018-03-08 DIAGNOSIS — Z803 Family history of malignant neoplasm of breast: Secondary | ICD-10-CM | POA: Insufficient documentation

## 2018-03-08 DIAGNOSIS — N6032 Fibrosclerosis of left breast: Secondary | ICD-10-CM | POA: Diagnosis not present

## 2018-03-08 DIAGNOSIS — E785 Hyperlipidemia, unspecified: Secondary | ICD-10-CM | POA: Diagnosis not present

## 2018-03-08 DIAGNOSIS — N6320 Unspecified lump in the left breast, unspecified quadrant: Secondary | ICD-10-CM

## 2018-03-08 DIAGNOSIS — Z885 Allergy status to narcotic agent status: Secondary | ICD-10-CM | POA: Insufficient documentation

## 2018-03-08 HISTORY — DX: Spinal stenosis, site unspecified: M48.00

## 2018-03-08 HISTORY — PX: BREAST LUMPECTOMY WITH RADIOACTIVE SEED LOCALIZATION: SHX6424

## 2018-03-08 LAB — GLUCOSE, CAPILLARY
GLUCOSE-CAPILLARY: 106 mg/dL — AB (ref 65–99)
Glucose-Capillary: 87 mg/dL (ref 65–99)

## 2018-03-08 SURGERY — BREAST LUMPECTOMY WITH RADIOACTIVE SEED LOCALIZATION
Anesthesia: General | Site: Breast | Laterality: Left

## 2018-03-08 MED ORDER — MIDAZOLAM HCL 2 MG/2ML IJ SOLN: 1.0000 mg | INTRAMUSCULAR | Status: DC | PRN

## 2018-03-08 MED ORDER — BUPIVACAINE-EPINEPHRINE (PF) 0.25% -1:200000 IJ SOLN
INTRAMUSCULAR | Status: AC
  Filled 2018-03-08: qty 30

## 2018-03-08 MED ORDER — ACETAMINOPHEN 500 MG PO TABS
1000.0000 mg | ORAL_TABLET | ORAL | Status: AC
  Administered 2018-03-08: 1000 mg via ORAL

## 2018-03-08 MED ORDER — EPHEDRINE SULFATE 50 MG/ML IJ SOLN
INTRAMUSCULAR | Status: AC
  Filled 2018-03-08: qty 1

## 2018-03-08 MED ORDER — BUPIVACAINE-EPINEPHRINE (PF) 0.25% -1:200000 IJ SOLN
INTRAMUSCULAR | Status: DC | PRN
  Administered 2018-03-08: 10 mL

## 2018-03-08 MED ORDER — LIDOCAINE HCL (CARDIAC) PF 100 MG/5ML IV SOSY
PREFILLED_SYRINGE | INTRAVENOUS | Status: AC
  Filled 2018-03-08: qty 5

## 2018-03-08 MED ORDER — LACTATED RINGERS IV SOLN: INTRAVENOUS | Status: DC

## 2018-03-08 MED ORDER — FENTANYL CITRATE (PF) 100 MCG/2ML IJ SOLN: 25.0000 ug | INTRAMUSCULAR | Status: DC | PRN

## 2018-03-08 MED ORDER — 0.9 % SODIUM CHLORIDE (POUR BTL) OPTIME
TOPICAL | Status: DC | PRN
  Administered 2018-03-08: 1000 mL

## 2018-03-08 MED ORDER — FENTANYL CITRATE (PF) 100 MCG/2ML IJ SOLN: 50.0000 ug | INTRAMUSCULAR | Status: DC | PRN

## 2018-03-08 MED ORDER — PROPOFOL 10 MG/ML IV BOLUS
INTRAVENOUS | Status: DC | PRN
  Administered 2018-03-08: 130 mg via INTRAVENOUS

## 2018-03-08 MED ORDER — GABAPENTIN 300 MG PO CAPS
300.0000 mg | ORAL_CAPSULE | ORAL | Status: AC
  Administered 2018-03-08: 300 mg via ORAL

## 2018-03-08 MED ORDER — SODIUM CHLORIDE 0.9 % IJ SOLN
INTRAMUSCULAR | Status: AC
  Filled 2018-03-08: qty 10

## 2018-03-08 MED ORDER — CHLORHEXIDINE GLUCONATE CLOTH 2 % EX PADS: 6.0000 | MEDICATED_PAD | Freq: Once | CUTANEOUS | Status: DC

## 2018-03-08 MED ORDER — CEFAZOLIN SODIUM-DEXTROSE 2-4 GM/100ML-% IV SOLN
2.0000 g | INTRAVENOUS | Status: AC
  Administered 2018-03-08: 2 g via INTRAVENOUS

## 2018-03-08 MED ORDER — SCOPOLAMINE 1 MG/3DAYS TD PT72: 1.0000 | MEDICATED_PATCH | Freq: Once | TRANSDERMAL | Status: DC | PRN

## 2018-03-08 MED ORDER — DEXAMETHASONE SODIUM PHOSPHATE 10 MG/ML IJ SOLN
INTRAMUSCULAR | Status: DC | PRN
  Administered 2018-03-08: 10 mg via INTRAVENOUS

## 2018-03-08 MED ORDER — FENTANYL CITRATE (PF) 100 MCG/2ML IJ SOLN
INTRAMUSCULAR | Status: AC
  Filled 2018-03-08: qty 2

## 2018-03-08 MED ORDER — ONDANSETRON HCL 4 MG/2ML IJ SOLN: 4.0000 mg | Freq: Once | INTRAMUSCULAR | Status: DC | PRN

## 2018-03-08 MED ORDER — GABAPENTIN 300 MG PO CAPS
ORAL_CAPSULE | ORAL | Status: AC
  Filled 2018-03-08: qty 1

## 2018-03-08 MED ORDER — TRAMADOL HCL 50 MG PO TABS: 50.0000 mg | ORAL_TABLET | Freq: Four times a day (QID) | ORAL | 1 refills | Status: DC | PRN

## 2018-03-08 MED ORDER — LIDOCAINE HCL (CARDIAC) PF 100 MG/5ML IV SOSY
PREFILLED_SYRINGE | INTRAVENOUS | Status: DC | PRN
  Administered 2018-03-08: 60 mg via INTRAVENOUS

## 2018-03-08 MED ORDER — EPHEDRINE SULFATE 50 MG/ML IJ SOLN
INTRAMUSCULAR | Status: DC | PRN
  Administered 2018-03-08: 5 mg via INTRAVENOUS

## 2018-03-08 MED ORDER — ONDANSETRON HCL 4 MG/2ML IJ SOLN
INTRAMUSCULAR | Status: AC
  Filled 2018-03-08: qty 2

## 2018-03-08 MED ORDER — BUPIVACAINE-EPINEPHRINE (PF) 0.5% -1:200000 IJ SOLN
INTRAMUSCULAR | Status: AC
  Filled 2018-03-08: qty 30

## 2018-03-08 MED ORDER — ACETAMINOPHEN 500 MG PO TABS
ORAL_TABLET | ORAL | Status: AC
  Filled 2018-03-08: qty 2

## 2018-03-08 MED ORDER — FENTANYL CITRATE (PF) 100 MCG/2ML IJ SOLN
INTRAMUSCULAR | Status: DC | PRN
  Administered 2018-03-08 (×2): 25 ug via INTRAVENOUS

## 2018-03-08 MED ORDER — DEXAMETHASONE SODIUM PHOSPHATE 10 MG/ML IJ SOLN
INTRAMUSCULAR | Status: AC
  Filled 2018-03-08: qty 1

## 2018-03-08 MED ORDER — CEFAZOLIN SODIUM-DEXTROSE 2-4 GM/100ML-% IV SOLN
INTRAVENOUS | Status: AC
  Filled 2018-03-08: qty 100

## 2018-03-08 MED ORDER — LACTATED RINGERS IV SOLN
INTRAVENOUS | Status: DC | PRN
  Administered 2018-03-08 (×2): via INTRAVENOUS

## 2018-03-08 MED ORDER — ONDANSETRON HCL 4 MG/2ML IJ SOLN
INTRAMUSCULAR | Status: DC | PRN
  Administered 2018-03-08: 4 mg via INTRAVENOUS

## 2018-03-08 SURGICAL SUPPLY — 48 items
ADH SKN CLS APL DERMABOND .7 (GAUZE/BANDAGES/DRESSINGS) ×1
BINDER BREAST LRG (GAUZE/BANDAGES/DRESSINGS) IMPLANT
BINDER BREAST MEDIUM (GAUZE/BANDAGES/DRESSINGS) IMPLANT
BINDER BREAST XLRG (GAUZE/BANDAGES/DRESSINGS) ×1 IMPLANT
BINDER BREAST XXLRG (GAUZE/BANDAGES/DRESSINGS) IMPLANT
BLADE SURG 15 STRL LF DISP TIS (BLADE) ×1 IMPLANT
BLADE SURG 15 STRL SS (BLADE) ×2
CANISTER SUC SOCK COL 7IN (MISCELLANEOUS) IMPLANT
CANISTER SUCT 1200ML W/VALVE (MISCELLANEOUS) IMPLANT
CHLORAPREP W/TINT 26ML (MISCELLANEOUS) ×2 IMPLANT
CLIP VESOCCLUDE SM WIDE 6/CT (CLIP) IMPLANT
COVER BACK TABLE 60X90IN (DRAPES) ×2 IMPLANT
COVER MAYO STAND STRL (DRAPES) ×2 IMPLANT
COVER PROBE W GEL 5X96 (DRAPES) ×2 IMPLANT
DECANTER SPIKE VIAL GLASS SM (MISCELLANEOUS) IMPLANT
DERMABOND ADVANCED (GAUZE/BANDAGES/DRESSINGS) ×1
DERMABOND ADVANCED .7 DNX12 (GAUZE/BANDAGES/DRESSINGS) ×1 IMPLANT
DEVICE DUBIN W/COMP PLATE 8390 (MISCELLANEOUS) ×2 IMPLANT
DRAPE LAPAROSCOPIC ABDOMINAL (DRAPES) ×2 IMPLANT
DRAPE UTILITY XL STRL (DRAPES) ×2 IMPLANT
DRSG PAD ABDOMINAL 8X10 ST (GAUZE/BANDAGES/DRESSINGS) ×1 IMPLANT
ELECT COATED BLADE 2.86 ST (ELECTRODE) ×2 IMPLANT
ELECT REM PT RETURN 9FT ADLT (ELECTROSURGICAL) ×2
ELECTRODE REM PT RTRN 9FT ADLT (ELECTROSURGICAL) ×1 IMPLANT
GLOVE BIOGEL PI IND STRL 8 (GLOVE) ×1 IMPLANT
GLOVE BIOGEL PI INDICATOR 8 (GLOVE) ×1
GLOVE ECLIPSE 7.5 STRL STRAW (GLOVE) ×2 IMPLANT
GOWN STRL REUS W/ TWL LRG LVL3 (GOWN DISPOSABLE) ×1 IMPLANT
GOWN STRL REUS W/ TWL XL LVL3 (GOWN DISPOSABLE) ×1 IMPLANT
GOWN STRL REUS W/TWL LRG LVL3 (GOWN DISPOSABLE) ×2
GOWN STRL REUS W/TWL XL LVL3 (GOWN DISPOSABLE) ×2
ILLUMINATOR WAVEGUIDE N/F (MISCELLANEOUS) IMPLANT
KIT MARKER MARGIN INK (KITS) ×2 IMPLANT
LIGHT WAVEGUIDE WIDE FLAT (MISCELLANEOUS) IMPLANT
NDL HYPO 25X1 1.5 SAFETY (NEEDLE) ×1 IMPLANT
NEEDLE HYPO 25X1 1.5 SAFETY (NEEDLE) ×2 IMPLANT
NS IRRIG 1000ML POUR BTL (IV SOLUTION) IMPLANT
PACK BASIN DAY SURGERY FS (CUSTOM PROCEDURE TRAY) ×2 IMPLANT
PENCIL BUTTON HOLSTER BLD 10FT (ELECTRODE) ×2 IMPLANT
SLEEVE SCD COMPRESS KNEE MED (MISCELLANEOUS) ×2 IMPLANT
SPONGE LAP 4X18 X RAY DECT (DISPOSABLE) ×2 IMPLANT
SUT MON AB 5-0 PS2 18 (SUTURE) ×2 IMPLANT
SUT VICRYL 3-0 CR8 SH (SUTURE) ×2 IMPLANT
SYR CONTROL 10ML LL (SYRINGE) ×2 IMPLANT
TOWEL OR 17X24 6PK STRL BLUE (TOWEL DISPOSABLE) ×2 IMPLANT
TOWEL OR NON WOVEN STRL DISP B (DISPOSABLE) ×2 IMPLANT
TUBE CONNECTING 20X1/4 (TUBING) IMPLANT
YANKAUER SUCT BULB TIP NO VENT (SUCTIONS) IMPLANT

## 2018-03-08 NOTE — Discharge Instructions (Signed)
[facility] °Office Phone Number [phone] ° °BREAST BIOPSY/ PARTIAL MASTECTOMY: POST OP INSTRUCTIONS ° °Always review your discharge instruction sheet given to you by the facility where your surgery was performed. ° °IF YOU HAVE DISABILITY OR FAMILY LEAVE FORMS, YOU MUST BRING THEM TO THE OFFICE FOR PROCESSING.  DO NOT GIVE THEM TO YOUR DOCTOR. ° °1. A prescription for pain medication may be given to you upon discharge.  Take your pain medication as prescribed, if needed.  If narcotic pain medicine is not needed, then you may take acetaminophen (Tylenol) or ibuprofen (Advil) as needed. °2. Take your usually prescribed medications unless otherwise directed °3. If you need a refill on your pain medication, please contact your pharmacy.  They will contact our office to request authorization.  Prescriptions will not be filled after 5pm or on week-ends. °4. You should eat very light the first 24 hours after surgery, such as soup, crackers, pudding, etc.  Resume your normal diet the day after surgery. °5. Most patients will experience some swelling and bruising in the breast.  Ice packs and a good support bra will help.  Swelling and bruising can take several days to resolve.  °6. It is common to experience some constipation if taking pain medication after surgery.  Increasing fluid intake and taking a stool softener will usually help or prevent this problem from occurring.  A mild laxative (Milk of Magnesia or Miralax) should be taken according to package directions if there are no bowel movements after 48 hours. °7. Unless discharge instructions indicate otherwise, you may remove your bandages 24-48 hours after surgery, and you may shower at that time.  You may have steri-strips (small skin tapes) in place directly over the incision.  These strips should be left on the skin for 7-10 days.  If your surgeon used skin glue on the incision, you may shower in 24 hours.  The glue will flake off over the  next 2-3 weeks.  Any sutures or staples will be removed at the office during your follow-up visit. °8. ACTIVITIES:  You may resume regular daily activities (gradually increasing) beginning the next day.  Wearing a good support bra or sports bra minimizes pain and swelling.  You may have sexual intercourse when it is comfortable. °a. You may drive when you no longer are taking prescription pain medication, you can comfortably wear a seatbelt, and you can safely maneuver your car and apply brakes. °b. RETURN TO WORK:  ______________________________________________________________________________________ °9. You should see your doctor in the office for a follow-up appointment approximately two weeks after your surgery.  Your doctor’s nurse will typically make your follow-up appointment when she calls you with your pathology report.  Expect your pathology report 2-3 business days after your surgery.  You may call to check if you do not hear from us after three days. °10. OTHER INSTRUCTIONS: _______________________________________________________________________________________________ _____________________________________________________________________________________________________________________________________ °_____________________________________________________________________________________________________________________________________ °_____________________________________________________________________________________________________________________________________ ° °WHEN TO CALL YOUR DOCTOR: °1. Fever over 101.0 °2. Nausea and/or vomiting. °3. Extreme swelling or bruising. °4. Continued bleeding from incision. °5. Increased pain, redness, or drainage from the incision. ° °The clinic staff is available to answer your questions during regular business hours.  Please don’t hesitate to call and ask to speak to one of the nurses for clinical concerns.  If you have a medical emergency, go to the nearest  emergency room or call 911.  A surgeon from [facility] is always on call at the hospital. ° °For further questions, please visit [email or web address]  ° ° ° ° °  Post Anesthesia Home Care Instructions ° °Activity: °Get plenty of rest for the remainder of the day. A responsible individual must stay with you for 24 hours following the procedure.  °For the next 24 hours, DO NOT: °-Drive a car °-Operate machinery °-Drink alcoholic beverages °-Take any medication unless instructed by your physician °-Make any legal decisions or sign important papers. ° °Meals: °Start with liquid foods such as gelatin or soup. Progress to regular foods as tolerated. Avoid greasy, spicy, heavy foods. If nausea and/or vomiting occur, drink only clear liquids until the nausea and/or vomiting subsides. Call your physician if vomiting continues. ° °Special Instructions/Symptoms: °Your throat may feel dry or sore from the anesthesia or the breathing tube placed in your throat during surgery. If this causes discomfort, gargle with warm salt water. The discomfort should disappear within 24 hours. ° °If you had a scopolamine patch placed behind your ear for the management of post- operative nausea and/or vomiting: ° °1. The medication in the patch is effective for 72 hours, after which it should be removed.  Wrap patch in a tissue and discard in the trash. Wash hands thoroughly with soap and water. °2. You may remove the patch earlier than 72 hours if you experience unpleasant side effects which may include dry mouth, dizziness or visual disturbances. °3. Avoid touching the patch. Wash your hands with soap and water after contact with the patch. °  ° °

## 2018-03-08 NOTE — Anesthesia Procedure Notes (Signed)
Procedure Name: LMA Insertion Date/Time: 03/08/2018 10:38 AM Performed by: Raenette Rover, CRNA Pre-anesthesia Checklist: Patient identified, Emergency Drugs available, Suction available and Patient being monitored Patient Re-evaluated:Patient Re-evaluated prior to induction Oxygen Delivery Method: Circle system utilized Preoxygenation: Pre-oxygenation with 100% oxygen Induction Type: IV induction LMA: LMA inserted LMA Size: 4.0 Number of attempts: 1 Placement Confirmation: positive ETCO2,  CO2 detector and breath sounds checked- equal and bilateral Tube secured with: Tape Dental Injury: Teeth and Oropharynx as per pre-operative assessment

## 2018-03-08 NOTE — Interval H&P Note (Signed)
History and Physical Interval Note:  03/08/2018 10:22 AM  Gabriela [name]  has presented today for surgery, with the diagnosis of left breast mass  The various methods of treatment have been discussed with the patient and family. After consideration of risks, benefits and other options for treatment, the patient has consented to  Procedure(s): LEFT BREAST LUMPECTOMY WITH RADIOACTIVE SEED LOCALIZATION (Left) as a surgical intervention .  The patient's history has been reviewed, patient examined, no change in status, stable for surgery.  I have reviewed the patient's chart and labs.  Questions were answered to the patient's satisfaction.     Darene Lamer Christy Ehrsam

## 2018-03-08 NOTE — Anesthesia Postprocedure Evaluation (Signed)
Anesthesia Post Note  Patient: Gabriela [name]  Procedure(s) Performed: LEFT BREAST LUMPECTOMY WITH RADIOACTIVE SEED LOCALIZATION (Left Breast)     Patient location during evaluation: PACU Anesthesia Type: General Level of consciousness: awake and alert Pain management: pain level controlled Vital Signs Assessment: post-procedure vital signs reviewed and stable Respiratory status: spontaneous breathing, nonlabored ventilation, respiratory function stable and patient connected to nasal cannula oxygen Cardiovascular status: blood pressure returned to baseline and stable Postop Assessment: no apparent nausea or vomiting Anesthetic complications: no    Last Vitals:  Vitals:   03/08/18 1145 03/08/18 1221  BP: 128/62 (!) 143/62  Pulse: 63 77  Resp: 11 16  Temp:  36.4 C  SpO2: 100% 100%    Last Pain:  Vitals:   03/08/18 1221  TempSrc:   PainSc: 0-No pain                 Gurnoor Ursua COKER

## 2018-03-08 NOTE — Op Note (Signed)
Preoperative Diagnosis: left breast mass  Postoprative Diagnosis: left breast mass  Procedure: Procedure(s): LEFT BREAST LUMPECTOMY WITH RADIOACTIVE SEED LOCALIZATION   Surgeon: Excell Seltzer T   Assistants: None  Anesthesia:  General LMA anesthesia  Indications: Patient is a 72-year-old female with a personal history of left breast cancer status post left breast lumpectomy and radiation therapy 2016.  Screening mammogram has shown a new irregular mass in the lateral left breast away from her previous lumpectomy site.  This is at the site of an open benign breast biopsy done years ago.  Large core needle biopsy showed fibrosis but this was felt to be discordant by radiology with suspicion based on the characteristics of the mass.  After discussion with the patient regarding options we have elected to proceed with radioactive seed localized excision of the mass for diagnosis.    Procedure Detail: Patient had previously undergone accurate placement of radioactive seed at the mass and clip site.  She was brought to the operating room, placed in the supine position on operating table, and laryngeal mask general anesthesia induced.  The left breast was widely sterilely prepped and draped.  Patient timeout was performed and correct procedure verified.  She received preoperative IV antibiotics.  The neoprobe was used to localize the area in the upper outer left breast.  I made a curvilinear incision directly overlying this and dissection was carried down into the superficial subcutaneous tissue.  The seed was quite superficial.  Using the neoprobe for guidance I excised an approximately 2-1/2 to 3 cm specimen of tissue around the seed.  There was some palpable thickening but again this was a previous remote benign lumpectomy site.  The specimen was removed.  It was inked for margins.  Specimen x-ray showed the seton marking clip centrally located.  Complete hemostasis was assured.  The cavity was  marked with clips.  Deep breast and subtenons tissue was closed with interrupted 3-0 Vicryl and the skin with running subcuticular 5-0 Monocryl and Dermabond.  Sponge needle instrument counts were correct.    Findings: As above  Estimated Blood Loss:  Minimal         Drains: None  Blood Given: none          Specimens: Left breast lumpectomy        Complications:  * No complications entered in OR log *         Disposition: PACU - hemodynamically stable.         Condition: stable

## 2018-03-08 NOTE — H&P (Signed)
History of Present Illness Marland Kitchen T. Saquan Furtick MD; 02/26/2018 11:03 AM) The patient is a 72 year old female who presents with breast cancer. Patient is well known to me with a history of left breast lumpectomy and negative sentinel lymph node biopsy for a 0.9 cm low grade ER/PR positive invasive ductal carcinoma presenting just at the areolar border. Surgery date 4/42016. There was associated DCIS. Margins were microscopically negative with the anterior margin focally less than 1 mm for DCIS. This was essentially right below the skin. She received postoperative radiation and is now on adjuvant tamoxifen without any difficulty.   Recent screening mammogram showed a possible area of developing architectural distortion in the outer left breast knot at the location of either previous surgical scar. Targeted ultrasound revealed a 6 mm ill-defined irregular hypoechoic area at the 1:30 o'clock position 8 cm from the nipple. A large core needle biopsy was recommended and accepted. This was performed on February 08, 2018 with pathology showing only fibrosis but no evidence of malignancy. This was felt to be discordant. Patient presents to discuss excisional biopsy. She has noted no breast symptoms, specifically lump for pain, skin changes or nipple discharge.   Problem List/Past Medical Marland Kitchen T. Lucca Greggs, MD; 02/26/2018 11:03 AM) MALIGNANT NEOPLASM OF LOWER-OUTER QUADRANT OF LEFT FEMALE BREAST (C50.512)  MALIGNANT NEOPLASM OF CENTRAL PORTION OF LEFT FEMALE BREAST (E95.284)   Past Surgical History Marland Kitchen T. Tattianna Schnarr, MD; 02/26/2018 11:03 AM) Breast Biopsy  Left. Oral Surgery  Shoulder Surgery  Left. Tonsillectomy   Diagnostic Studies History Marland Kitchen T. Kayleanna Lorman, MD; 02/26/2018 11:03 AM) Colonoscopy  5-10 years ago Mammogram  within last year Pap Smear  1-5 years ago  Allergies (April Staton, CMA; 02/26/2018 10:25 AM) Alesia Morin *ANALGESICS - OPIOID*  Sulfur *CHEMICALS*   Hives.  Medication History (April Staton, CMA; 02/26/2018 10:27 AM) MetFORMIN HCl ER (500MG  Tablet ER 24HR, Oral) Active. Fish Oil Active. Tamoxifen Citrate (20MG  Tablet, Oral) Active. Cholecalciferol (1000UNIT Capsule, Oral) Active. Garlic (1000MG  Capsule, Oral) Active. Vitamin C (100MG  Tablet, Oral) Active.  Social History Marland Kitchen T. Glyndon Tursi, MD; 02/26/2018 11:03 AM) Caffeine use  Tea. No alcohol use  No drug use  Tobacco use  Never smoker.  Family History Marland Kitchen T. Kelli Egolf, MD; 02/26/2018 11:03 AM) Breast Cancer  Family Members In General. Cervical Cancer  Family Members In General. Colon Cancer  Family Members In General. Diabetes Mellitus  Family Members In General, Sister. Thyroid problems  Sister.  Pregnancy / Birth History Marland Kitchen T. Jawara Latorre, MD; 02/26/2018 11:03 AM) Age at menarche  5 years. Age of menopause  47-60 Contraceptive History  Contraceptive implant. Gravida  5 Maternal age  52-25 Para  89  Other Problems Marland Kitchen T. Shannon Balthazar, MD; 02/26/2018 11:03 AM) Breast Cancer  Diabetes Mellitus  Lump In Breast     Physical Exam Marland Kitchen T. Rayyan Orsborn MD; 02/26/2018 11:04 AM) The physical exam findings are as follows: Note:General: Alert, well-developed and well nourished Caucasian female, in no distress Skin: Warm and dry without rash or infection. HEENT: No palpable masses or thyromegaly. Sclera nonicteric. Pupils equal round and reactive. Lymph nodes: No cervical, supraclavicular, or axillary nodes palpable. Breasts: No palpable masses in either breast with attention to the lumpectomy site. Nothing palpable in the area of the recent biopsy. No skin changes or nipple crusting or discharge Lungs: Breath sounds clear and equal. No wheezing or increased work of breathing. Cardiovascular: Regular rate and rhythm without murmer. No JVD or edema. Peripheral pulses intact. No carotid bruits. Neurologic: Alert and fully  oriented. Gait  normal. No focal weakness. Psychiatric: Normal mood and affect. Thought content appropriate with normal judgement and insight    Assessment & Plan Marland Kitchen T. Corene Resnick MD; 02/26/2018 11:07 AM) MASS OF LEFT BREAST ON MAMMOGRAM (N63.20) Impression: Patient with personal history of left breast lumpectomy and radiation 2016 for DCIS. On tamoxifen. Now mammogram showing an area of architectural distortion and suspicious appearing 6 mm mass on ultrasound 1:30 position left breast. Biopsy shows only fibrosis which is felt to be discordant. I discussed all this with the patient and her family. I recommended proceeding with radioactive seed localized left breast lumpectomy for diagnosis. We discussed the indications and nature of the procedure and possible findings. Risks of bleeding infection wound healing problems and anesthetic complications discussed. We discussed that if this does turn out to be malignant that further treatment up to and including mastectomy may be indicated. All of her and her family's questions were answered. Current Plans Radioactive seed localized left breast lumpectomy under general anesthesia as an outpatient.

## 2018-03-08 NOTE — Anesthesia Preprocedure Evaluation (Addendum)
Anesthesia Evaluation  Patient identified by MRN, date of birth, ID band Patient awake    Reviewed: Allergy & Precautions, NPO status , Patient's Chart, lab work & pertinent test results  Airway Mallampati: II  TM Distance: >3 FB Neck ROM: Full    Dental  (+) Teeth Intact, Dental Advisory Given   Pulmonary    breath sounds clear to auscultation       Cardiovascular  Rhythm:Regular Rate:Normal     Neuro/Psych    GI/Hepatic   Endo/Other  diabetes  Renal/GU      Musculoskeletal   Abdominal   Peds  Hematology   Anesthesia Other Findings   Reproductive/Obstetrics                            Anesthesia Physical Anesthesia Plan  ASA: III  Anesthesia Plan: General   Post-op Pain Management:    Induction: Intravenous  PONV Risk Score and Plan: Ondansetron  Airway Management Planned: LMA  Additional Equipment:   Intra-op Plan:   Post-operative Plan:   Informed Consent: I have reviewed the patients History and Physical, chart, labs and discussed the procedure including the risks, benefits and alternatives for the proposed anesthesia with the patient or authorized representative who has indicated his/her understanding and acceptance.     Plan Discussed with: CRNA and Anesthesiologist  Anesthesia Plan Comments:         Anesthesia Quick Evaluation

## 2018-03-08 NOTE — Transfer of Care (Signed)
Immediate Anesthesia Transfer of Care Note  Patient: Gabriela [name]  Procedure(s) Performed: LEFT BREAST LUMPECTOMY WITH RADIOACTIVE SEED LOCALIZATION (Left Breast)  Patient Location: PACU  Anesthesia Type:General  Level of Consciousness: awake, alert , oriented and patient cooperative  Airway & Oxygen Therapy: Patient Spontanous Breathing and Patient connected to face mask oxygen  Post-op Assessment: Report given to RN and Post -op Vital signs reviewed and stable  Post vital signs: Reviewed and stable  Last Vitals:  Vitals Value Taken Time  BP 138/78 03/08/2018 11:21 AM  Temp    Pulse 59 03/08/2018 11:24 AM  Resp 13 03/08/2018 11:24 AM  SpO2 100 % 03/08/2018 11:24 AM  Vitals shown include unvalidated device data.  Last Pain:  Vitals:   03/08/18 0823  TempSrc: Oral  PainSc: 0-No pain         Complications: No apparent anesthesia complications

## 2018-03-11 ENCOUNTER — Encounter (HOSPITAL_BASED_OUTPATIENT_CLINIC_OR_DEPARTMENT_OTHER): Payer: Self-pay | Admitting: General Surgery

## 2018-03-25 DIAGNOSIS — L578 Other skin changes due to chronic exposure to nonionizing radiation: Secondary | ICD-10-CM | POA: Diagnosis not present

## 2018-03-25 DIAGNOSIS — L718 Other rosacea: Secondary | ICD-10-CM | POA: Diagnosis not present

## 2018-03-25 DIAGNOSIS — L219 Seborrheic dermatitis, unspecified: Secondary | ICD-10-CM | POA: Diagnosis not present

## 2018-04-06 ENCOUNTER — Other Ambulatory Visit: Payer: Self-pay | Admitting: Family Medicine

## 2018-04-06 DIAGNOSIS — E119 Type 2 diabetes mellitus without complications: Secondary | ICD-10-CM

## 2018-04-06 DIAGNOSIS — E559 Vitamin D deficiency, unspecified: Secondary | ICD-10-CM

## 2018-04-08 ENCOUNTER — Ambulatory Visit (INDEPENDENT_AMBULATORY_CARE_PROVIDER_SITE_OTHER): Payer: Medicare Other

## 2018-04-08 ENCOUNTER — Ambulatory Visit: Payer: Medicare Other

## 2018-04-08 VITALS — BP 110/60 | HR 66 | Temp 98.6°F | Ht 66.5 in | Wt 181.8 lb

## 2018-04-08 DIAGNOSIS — E119 Type 2 diabetes mellitus without complications: Secondary | ICD-10-CM | POA: Diagnosis not present

## 2018-04-08 DIAGNOSIS — E559 Vitamin D deficiency, unspecified: Secondary | ICD-10-CM | POA: Diagnosis not present

## 2018-04-08 DIAGNOSIS — Z0000 Encounter for general adult medical examination without abnormal findings: Secondary | ICD-10-CM

## 2018-04-08 DIAGNOSIS — Z1159 Encounter for screening for other viral diseases: Secondary | ICD-10-CM

## 2018-04-08 LAB — VITAMIN D 25 HYDROXY (VIT D DEFICIENCY, FRACTURES): VITD: 55.22 ng/mL (ref 30.00–100.00)

## 2018-04-08 LAB — MICROALBUMIN / CREATININE URINE RATIO
Creatinine,U: 63.3 mg/dL
Microalb Creat Ratio: 1.1 mg/g (ref 0.0–30.0)
Microalb, Ur: <0.7 mg/dL (ref 0.0–1.9)

## 2018-04-08 LAB — CBC WITH DIFFERENTIAL/PLATELET
BASOS PCT: 1.2 % (ref 0.0–3.0)
Basophils Absolute: 0.1 10*3/uL (ref 0.0–0.1)
EOS PCT: 1.3 % (ref 0.0–5.0)
Eosinophils Absolute: 0.1 10*3/uL (ref 0.0–0.7)
HCT: 38 % (ref 36.0–46.0)
Hemoglobin: 12.8 g/dL (ref 12.0–15.0)
Lymphocytes Relative: 21.6 % (ref 12.0–46.0)
Lymphs Abs: 1 10*3/uL (ref 0.7–4.0)
MCHC: 33.8 g/dL (ref 30.0–36.0)
MCV: 87.4 fl (ref 78.0–100.0)
MONOS PCT: 7.3 % (ref 3.0–12.0)
Monocytes Absolute: 0.3 10*3/uL (ref 0.1–1.0)
NEUTROS ABS: 3.3 10*3/uL (ref 1.4–7.7)
Neutrophils Relative %: 68.6 % (ref 43.0–77.0)
PLATELETS: 189 10*3/uL (ref 150.0–400.0)
RBC: 4.35 Mil/uL (ref 3.87–5.11)
RDW: 13.2 % (ref 11.5–15.5)
WBC: 4.8 10*3/uL (ref 4.0–10.5)

## 2018-04-08 LAB — COMPREHENSIVE METABOLIC PANEL
ALBUMIN: 4.1 g/dL (ref 3.5–5.2)
ALT: 9 U/L (ref 0–35)
AST: 14 U/L (ref 0–37)
Alkaline Phosphatase: 18 U/L — ABNORMAL LOW (ref 39–117)
BUN: 14 mg/dL (ref 6–23)
CALCIUM: 9.4 mg/dL (ref 8.4–10.5)
CHLORIDE: 102 meq/L (ref 96–112)
CO2: 31 mEq/L (ref 19–32)
CREATININE: 0.82 mg/dL (ref 0.40–1.20)
GFR: 73.15 mL/min (ref >60.00)
Glucose, Bld: 131 mg/dL — ABNORMAL HIGH (ref 70–99)
Potassium: 4.6 mEq/L (ref 3.5–5.1)
Sodium: 138 mEq/L (ref 135–145)
Total Bilirubin: 0.4 mg/dL (ref 0.2–1.2)
Total Protein: 6.6 g/dL (ref 6.0–8.3)

## 2018-04-08 LAB — HEMOGLOBIN A1C: Hgb A1c MFr Bld: 6.4 % (ref 4.6–6.5)

## 2018-04-08 NOTE — Progress Notes (Signed)
I reviewed health advisor's note, was available for consultation, and agree with documentation and plan.  

## 2018-04-08 NOTE — Patient Instructions (Signed)
Gabriela [name] , Thank you for taking time to come for your Medicare Wellness Visit. I appreciate your ongoing commitment to your health goals. Please review the following plan we discussed and let me know if I can assist you in the future.   These are the goals we discussed: Goals    . Patient Stated     Starting 04/08/2018, I will continue to walk at least 60 minutes daily and to work in garden as desired.        This is a list of the screening recommended for you and due dates:  Health Maintenance  Topic Date Due  . Complete foot exam   04/18/2018*  . DEXA scan (bone density measurement)  04/18/2018*  . Pneumonia vaccines (1 of 2 - PCV13) 04/18/2018*  . Colon Cancer Screening  04/07/2019*  . Eye exam for diabetics  05/14/2018  . Flu Shot  06/06/2018  . Hemoglobin A1C  10/08/2018  . Urine Protein Check  04/09/2019  . Mammogram  02/08/2020  . Tetanus Vaccine  06/16/2027  .  Hepatitis C: One time screening is recommended by Center for Disease Control  (CDC) for  adults born from [birth year] through 1964.   Completed  *Topic was postponed. The date shown is not the original due date.   Preventive Care for Adults  A healthy lifestyle and preventive care can promote health and wellness. Preventive health guidelines for adults include the following key practices.  . A routine yearly physical is a good way to check with your health care provider about your health and preventive screening. It is a chance to share any concerns and updates on your health and to receive a thorough exam.  . Visit your dentist for a routine exam and preventive care every 6 months. Brush your teeth twice a day and floss once a day. Good oral hygiene prevents tooth decay and gum disease.  . The frequency of eye exams is based on your age, health, family medical history, use  of contact lenses, and other factors. Follow your health care provider's recommendations for frequency of eye exams.  . Eat a healthy diet. Foods  like vegetables, fruits, whole grains, low-fat dairy products, and lean protein foods contain the nutrients you need without too many calories. Decrease your intake of foods high in solid fats, added sugars, and salt. Eat the right amount of calories for you. Get information about a proper diet from your health care provider, if necessary.  . Regular physical exercise is one of the most important things you can do for your health. Most adults should get at least 150 minutes of moderate-intensity exercise (any activity that increases your heart rate and causes you to sweat) each week. In addition, most adults need muscle-strengthening exercises on 2 or more days a week.  Silver Sneakers may be a benefit available to you. To determine eligibility, you may visit the website: www.silversneakers.com or contact program at 585-350-6364 Mon-Fri between 8AM-8PM.   . Maintain a healthy weight. The body mass index (BMI) is a screening tool to identify possible weight problems. It provides an estimate of body fat based on height and weight. Your health care provider can find your BMI and can help you achieve or maintain a healthy weight.   For adults 20 years and older: ? A BMI below 18.5 is considered underweight. ? A BMI of 18.5 to 24.9 is normal. ? A BMI of 25 to 29.9 is considered overweight. ? A BMI of 30 and  above is considered obese.   . Maintain normal blood lipids and cholesterol levels by exercising and minimizing your intake of saturated fat. Eat a balanced diet with plenty of fruit and vegetables. Blood tests for lipids and cholesterol should begin at age 68 and be repeated every 5 years. If your lipid or cholesterol levels are high, you are over 50, or you are at high risk for heart disease, you may need your cholesterol levels checked more frequently. Ongoing high lipid and cholesterol levels should be treated with medicines if diet and exercise are not working.  . If you smoke, find out from  your health care provider how to quit. If you do not use tobacco, please do not start.  . If you choose to drink alcohol, please do not consume more than 2 drinks per day. One drink is considered to be 12 ounces (355 mL) of beer, 5 ounces (148 mL) of wine, or 1.5 ounces (44 mL) of liquor.  . If you are 63-52 years old, ask your health care provider if you should take aspirin to prevent strokes.  . Use sunscreen. Apply sunscreen liberally and repeatedly throughout the day. You should seek shade when your shadow is shorter than you. Protect yourself by wearing long sleeves, pants, a wide-brimmed hat, and sunglasses year round, whenever you are outdoors.  . Once a month, do a whole body skin exam, using a mirror to look at the skin on your back. Tell your health care provider of new moles, moles that have irregular borders, moles that are larger than a pencil eraser, or moles that have changed in shape or color.

## 2018-04-08 NOTE — Progress Notes (Signed)
PCP notes:   Health maintenance:  Foot exam - PCP please address at next appt Bone density -  PCP please address at next appt PNA vaccines -  PCP please address at next appt Colonoscopy -  PCP please address at next appt A1C - completed Microalbumin - completed Hep C screening - completed  Abnormal screenings:   Hearing - failed  Hearing Screening   125Hz  250Hz  500Hz  1000Hz  2000Hz  3000Hz  4000Hz  6000Hz  8000Hz   Right ear:   40 40 40  40    Left ear:   40 40 40  0     Mini-Cog score: 19/20 MMSE - Mini Mental State Exam 04/08/2018  Orientation to time 5  Orientation to Place 5  Registration 3  Attention/ Calculation 0  Recall 2  Recall-comments unable to recall 1 of 3 words  Language- name 2 objects 0  Language- repeat 1  Language- follow 3 step command 3  Language- read & follow direction 0  Write a sentence 0  Copy design 0  Total score 19    Patient concerns:   None  Nurse concerns:  None  Next PCP appt:   04/17/18 @ 0830

## 2018-04-08 NOTE — Progress Notes (Signed)
Subjective:   Gabriela [name] is a 72 y.o. female who presents for an Initial Medicare Annual Wellness Visit.  Review of Systems    N/A  Cardiac Risk Factors include: advanced age (>93mn, >>62women);diabetes mellitus     Objective:    Today's Vitals   04/08/18 1014  BP: 110/60  Pulse: 66  Temp: 98.6 F (37 C)  TempSrc: Oral  SpO2: 99%  Weight: 181 lb 12 oz (82.4 kg)  Height: 5' 6.5" (1.689 m)  PainSc: 0-No pain   Body mass index is 28.9 kg/m.  Advanced Directives 04/08/2018 03/08/2018 03/01/2018 06/15/2017 06/15/2017 06/15/2017 07/19/2016  Does Patient Have a Medical Advance Directive? Yes Yes Yes No No No Yes  Type of Advance Directive [place] Does patient want to make changes to medical advance directive? - No - Patient declined No - Patient declined - - - -  Copy of H[place] Chart? - - - - - - -  Would patient like information on creating a medical advance directive? - No - Patient declined - No - Patient declined - No - Patient declined -    Current Medications (verified) Outpatient Encounter Medications as of 04/08/2018  Medication Sig  . Blood Glucose Monitoring Suppl (ONE TOUCH ULTRA 2) w/Device KIT USE AS DIRECTED  . cetirizine (ZYRTEC) 10 MG tablet Take 10 mg by mouth.  . Cholecalciferol (VITAMIN D) 2000 units tablet Take by mouth.  . Garlic 15374MG CAPS Take by mouth.  .Marland Kitchenglucose blood (ONE TOUCH ULTRA TEST) test strip Use once daily. Dx code: E11.9 Onr touch Ultra blue  . metFORMIN (GLUCOPHAGE-XR) 500 MG 24 hr tablet Take 1 tablet (500 mg total) by mouth 2 (two) times daily.  . Multiple Vitamins-Minerals (MULTIVITAMIN WOMEN 50+ PO) Take 1 tablet by mouth daily.  . Omega-3 1000 MG CAPS Take by mouth.  .Glory RosebushDELICA LANCETS 382LMISC Use to check sugar daily  . tamoxifen (NOLVADEX) 20 MG tablet TAKE 1 TABLET BY MOUTH  DAILY  . traMADol (ULTRAM) 50 MG tablet Take 1 tablet (50 mg total) by  mouth every 6 (six) hours as needed. (Patient not taking: Reported on 04/08/2018)   No facility-administered encounter medications on file as of 04/08/2018.     Allergies (verified) Oxycontin [oxycodone hcl] and Sulfa antibiotics   History: Past Medical History:  Diagnosis Date  . Arthritis   . [place]    left breast  . Chronic pain    from fall 2012  . Diabetes mellitus without complication (HCC)    diet controlled  . History of closed head injury 2012   accident work threw her 150f . Hyperlipemia   . Personal history of radiation therapy   . Spinal stenosis   . Urinary incontinence    wears a pessery   Past Surgical History:  Procedure Laterality Date  . ABDOMINAL HYSTERECTOMY    . BREAST LUMPECTOMY  1999   left  . BREAST LUMPECTOMY Left 2016  . BREAST LUMPECTOMY WITH RADIOACTIVE SEED LOCALIZATION Left 03/08/2018   Procedure: LEFT BREAST LUMPECTOMY WITH RADIOACTIVE SEED LOCALIZATION;  Surgeon: HoExcell SeltzerMD;  [facility] Service: General;  Laterality: Left;  . COLONOSCOPY    . ORIF WRIST FRACTURE Right 06/15/2017   Procedure: OPEN REDUCTION INTERNAL FIXATION (ORIF) WRIST FRACTURE WITH REDUCTION AND PINNINGT OF CARPAL DISLOCATION;  Surgeon: ThMilly JakobMD;  [facility];  Service: Orthopedics;  Laterality: Right;  . RADIOACTIVE SEED GUIDED PARTIAL MASTECTOMY WITH AXILLARY SENTINEL LYMPH NODE BIOPSY Left 02/08/2015   Procedure: RADIOACTIVE SEED LOCALIZATION LUMPECTOMY WITH LEFT AXILLARY SENTINEL LYMPH NODE BIOPSY;  Surgeon: Excell Seltzer, MD;  Location: [facility];  Service: General;  Laterality: Left;  . SHOULDER ARTHROSCOPY  2012   left  . TONSILLECTOMY     History reviewed. No pertinent family history. Social History   Socioeconomic History  . Marital status: Married    Spouse name: Not on file  . Number of children: Not on file  . Years of education: Not on file  . Highest education level: Not on file    Occupational History  . Not on file  Social Needs  . Financial resource strain: Not on file  . Food insecurity:    Worry: Not on file    Inability: Not on file  . Transportation needs:    Medical: Not on file    Non-medical: Not on file  Tobacco Use  . Smoking status: Never Smoker  . Smokeless tobacco: Never Used  Substance and Sexual Activity  . Alcohol use: No  . Drug use: No  . Sexual activity: Not on file  Lifestyle  . Physical activity:    Days per week: Not on file    Minutes per session: Not on file  . Stress: Not on file  Relationships  . Social connections:    Talks on phone: Not on file    Gets together: Not on file    Attends religious service: Not on file    Active member of club or organization: Not on file    Attends meetings of clubs or organizations: Not on file    Relationship status: Not on file  Other Topics Concern  . Not on file  Social History Narrative  . Not on file    Tobacco Counseling Counseling given: No   Clinical Intake:  Pre-visit preparation completed: Yes  Pain : No/denies pain Pain Score: 0-No pain     Nutritional Status: BMI 25 -29 Overweight Nutritional Risks: None Diabetes: Yes CBG done?: No Did pt. bring in CBG monitor from home?: No  How often do you need to have someone help you when you read instructions, pamphlets, or other written materials from your doctor or pharmacy?: 1 - Never What is the last grade level you completed in school?: 12th grade + 2 yrs business college  Interpreter Needed?: No  Comments: pt lives with spouse Information entered by :: LPinson, LPN   Activities of Daily Living In your present state of health, do you have any difficulty performing the following activities: 04/08/2018 03/08/2018  Hearing? N N  Vision? N N  Difficulty concentrating or making decisions? N N  Walking or climbing stairs? N N  Dressing or bathing? N N  Doing errands, shopping? N -  Preparing Food and eating ? N -   Using the Toilet? N -  In the past six months, have you accidently leaked urine? N -  Do you have problems with loss of bowel control? N -  Managing your Medications? N -  Managing your Finances? N -  Housekeeping or managing your Housekeeping? N -  Some recent data might be hidden     Immunizations and Health Maintenance Immunization History  Administered Date(s) Administered  . Influenza-Unspecified 08/05/2014, 08/06/2016, 08/15/2017  . Pneumococcal Polysaccharide-23 02/05/2008  . Td 11/06/2006  . Tdap 06/15/2017   There are  no preventive care reminders to display for this patient.  Patient Care Team: [name], FNP as PCP - General (Nurse Practitioner) Excell Seltzer, MD as Consulting Physician (General Surgery) Philemon Kingdom, MD as Consulting Physician (Internal Medicine)     Assessment:   This is a routine wellness examination for Kumari.  Hearing/Vision screen  Hearing Screening   '125Hz'  '250Hz'  '500Hz'  '1000Hz'  '2000Hz'  '3000Hz'  '4000Hz'  '6000Hz'  '8000Hz'   Right ear:   40 40 40  40    Left ear:   40 40 40  0    Vision Screening Comments: Vision exam in July 2018   Dietary issues and exercise activities discussed: Current Exercise Habits: Home exercise routine, Type of exercise: walking, Time (Minutes): 60, Frequency (Times/Week): 7, Weekly Exercise (Minutes/Week): 420, Intensity: Mild, Exercise limited by: None identified  Goals    . Patient Stated     Starting 04/08/2018, I will continue to walk at least 60 minutes daily and to work in garden as desired.       Depression Screen PHQ 2/9 Scores 04/08/2018 12/03/2015  PHQ - 2 Score 0 0  PHQ- 9 Score 0 -    Fall Risk Fall Risk  04/08/2018 12/03/2015  Falls in the past year? No No    Cognitive Function: MMSE - Mini Mental State Exam 04/08/2018  Orientation to time 5  Orientation to Place 5  Registration 3  Attention/ Calculation 0  Recall 2  Recall-comments unable to recall 1 of 3 words  Language- name 2  objects 0  Language- repeat 1  Language- follow 3 step command 3  Language- read & follow direction 0  Write a sentence 0  Copy design 0  Total score 19     PLEASE NOTE: A Mini-Cog screen was completed. Maximum score is 20. A value of 0 denotes this part of [facility]stein MMSE was not completed or the patient failed this part of the Mini-Cog screening.   Mini-Cog Screening Orientation to Time - Max 5 pts Orientation to Place - Max 5 pts Registration - Max 3 pts Recall - Max 3 pts Language Repeat - Max 1 pts Language Follow 3 Step Command - Max 3 pts     Screening Tests Health Maintenance  Topic Date Due  . FOOT EXAM  04/18/2018 (Originally 10/17/1956)  . DEXA SCAN  04/18/2018 (Originally 10/17/2012)  . PNA vac Low Risk Adult (1 of 2 - PCV13) 04/18/2018 (Originally 10/17/2012)  . COLONOSCOPY  04/07/2019 (Originally 10/17/1997)  . OPHTHALMOLOGY EXAM  05/14/2018  . INFLUENZA VACCINE  06/06/2018  . HEMOGLOBIN A1C  10/08/2018  . URINE MICROALBUMIN  04/09/2019  . MAMMOGRAM  02/08/2020  . TETANUS/TDAP  06/16/2027  . Hepatitis C Screening  Completed     Plan:     I have personally reviewed, addressed, and noted the following in the patient's chart:  A. Medical and social history B. Use of alcohol, tobacco or illicit drugs  C. Current medications and supplements D. Functional ability and status E.  Nutritional status F.  Physical activity G. Advance directives H. List of other physicians I.  Hospitalizations, surgeries, and ER visits in previous 12 months J.  Foster to include hearing, vision, cognitive, depression L. Referrals and appointments - none  In addition, I have reviewed and discussed with patient certain preventive protocols, quality metrics, and best practice recommendations. A written personalized care plan for preventive services as well as general preventive health recommendations were provided to patient.  See attached scanned questionnaire for  additional information.   Signed,   Lindell Noe, MHA, BS, LPN Health Coach

## 2018-04-09 LAB — HEPATITIS C ANTIBODY
HEP C AB: NONREACTIVE
SIGNAL TO CUT-OFF: 0.02 (ref <1.00)

## 2018-04-12 ENCOUNTER — Other Ambulatory Visit: Payer: Self-pay | Admitting: *Deleted

## 2018-04-12 DIAGNOSIS — C50412 Malignant neoplasm of upper-outer quadrant of left female breast: Secondary | ICD-10-CM

## 2018-04-12 DIAGNOSIS — Z170 Estrogen receptor positive status [ER+]: Principal | ICD-10-CM

## 2018-04-12 NOTE — Progress Notes (Signed)
[facility]  [phone] [phone]    ID: Gabriela [name] DOB: [birth year]  MR#: [id number]  QBV#:[id number]  Patient Care Team: Excell Seltzer, MD as Consulting Physician (General Surgery) Philemon Kingdom, MD as Consulting Physician (Internal Medicine) PCP: No primary care provider on file. GYN: SU: Excell Seltzer M.D. OTHER MD: Prentiss Bells "[name]" [place], [place]; Berton Mount MD, Dossie Arbour MD  CHIEF COMPLAINT: Estrogen receptor positive breast cancer  CURRENT TREATMENT: Tamoxifen  BREAST CANCER HISTORY: From the original intake note:  The patient herself noted a lump in her left breast, associated with some discomfort. She brought it to her physician's attention and on 01/15/2015 underwent left digital mammography with tomography and ultrasonography at [facility]. This showed breast density to be category A. Compression images were negative but on physical exam there was a firm palpable mobile nodule at the 3:00 position of the left breast. By ultrasound this measured 9 mm maximally. There is no note regarding axillary adenopathy in the mammographic report.  On 01/18/2015 the patient underwent biopsy of the mass in question and this showed ([id number]; [id number]) an invasive mammary carcinoma, measuring 3 mm on the specimen, grade 1, estrogen receptor greater than 90% positive, progesterone receptor between 11 and 50% positive, with no HER-2 amplification  (Immunohistochemistry score 1+).  On 02/03/2015 the patient underwent bilateral breast MRI. This suggested a breast density category B. The right breast was negative and there were no abnormal appearing lymph nodes. In the left breast there was an irregular enhancing mass subareolar early measuring 9 mm.  Her subsequent history is as detailed below  INTERVAL HISTORY: Gabriela [name] returns today for follow-up of her estrogen receptor positive breast cancer. She continues on  tamoxifen, with good tolerance. She denies having hot flashes or increased vaginal discharge.  Since her last visit, she underwent diagnostic bilateral mammography with CAD and tomography and left breast ultrasonography on 02/07/2018 at [facility] showing: breast density category B. Developing area of architectural distortion in the outer left breast with a 6 mm probable correlating ill-defined mass in the 1:30 o'clock position. This is suspicious for malignancy.   This area was biopsied on 02/08/2018 showing fibrosis and no evidence of malignancy. She then underwent lumpectomy ([id number]) of this area on 03/08/2018 showing fibrosis and no malignancy.   REVIEW OF SYSTEMS: Gabriela [name] reports that she was not in pain after her lumpectomy. She didn't need any pain medicine. She is managing her diabetes. She feels occasional pain in the left leg. She notes that a month ago, she was push mowing her grass and a rock hit her leg which caused a large bruise. She has some leg leg swelling. She denies unusual headaches, visual changes, nausea, vomiting, or dizziness. There has been no unusual cough, phlegm production, or pleurisy. This been no change in bowel or bladder habits. She denies unexplained fatigue or unexplained weight loss, bleeding, rash, or fever. A detailed review of systems was otherwise stable.    Past Medical History:  Diagnosis Date  . Arthritis   . Cancer ([place])    left breast  . Chronic pain    from fall 2012  . Diabetes mellitus without complication (HCC)    diet controlled  . History of closed head injury 2012   accident work threw her 10f  . Hyperlipemia   . Personal history of radiation therapy   . Spinal stenosis   . Urinary incontinence    wears a pessery   Past Surgical History:  Procedure Laterality Date  . ABDOMINAL HYSTERECTOMY    . BREAST LUMPECTOMY  1999   left  . BREAST LUMPECTOMY Left 2016  . BREAST LUMPECTOMY WITH RADIOACTIVE SEED LOCALIZATION Left  03/08/2018   Procedure: LEFT BREAST LUMPECTOMY WITH RADIOACTIVE SEED LOCALIZATION;  Surgeon: Excell Seltzer, MD;  Location: [facility];  Service: General;  Laterality: Left;  . COLONOSCOPY    . ORIF WRIST FRACTURE Right 06/15/2017   Procedure: OPEN REDUCTION INTERNAL FIXATION (ORIF) WRIST FRACTURE WITH REDUCTION AND PINNINGT OF CARPAL DISLOCATION;  Surgeon: Milly Jakob, MD;  Location: [facility];  Service: Orthopedics;  Laterality: Right;  . RADIOACTIVE SEED GUIDED PARTIAL MASTECTOMY WITH AXILLARY SENTINEL LYMPH NODE BIOPSY Left 02/08/2015   Procedure: RADIOACTIVE SEED LOCALIZATION LUMPECTOMY WITH LEFT AXILLARY SENTINEL LYMPH NODE BIOPSY;  Surgeon: Excell Seltzer, MD;  Location: [facility];  Service: General;  Laterality: Left;  . SHOULDER ARTHROSCOPY  2012   left  . TONSILLECTOMY      FAMILY HISTORY No family history on file. The patient's father died in an automobile accident at age 35. The patient's mother died at the age of 68 from colon cancer. The patient has been told that her mother was on a medication during her pregnancy that may have caused the colon cancer. The patient's mother had a sister diagnosed with breast and cervical cancer in her 45s. She also had a brother who was killed in World War II 1. There is no other history of cancer in the family to the patient's knowledge   GYNECOLOGIC HISTORY:  No LMP recorded. Patient has had a hysterectomy. Menarche age 89, first live birth age 36. The patient is GX P3. She went through the change of life approximately age 87. She did not take hormone replacement. She never used birth control   SOCIAL HISTORY:  The patient and her husband Gabriela [name] are both retired. They live on [address] acres in our quite busy caring for that. At is also [place]'s chief source of exercise. [name] is an occupational Programmer, applications in [place]. [name] is a Cabin crew in [place]. [name] works as a Librarian, academic  for the twin [employer] in [place]. The patient has 4 grandchildren. She is a Psychologist, forensic.    ADVANCED DIRECTIVES: In place   HEALTH MAINTENANCE: Social History  Substance Use Topics  . Smoking status: Never Smoker  . Smokeless tobacco: Never Used  . Alcohol use No     Colonoscopy:2008/current nodal clinic  PAP:2015  Bone density: 2007  Lipid panel:  Allergies  Allergen Reactions  . Oxycontin [Oxycodone Hcl]   . Sulfa Antibiotics Itching   Outpatient Encounter Medications as of 04/15/2018  Medication Sig  . Blood Glucose Monitoring Suppl (ONE TOUCH ULTRA 2) w/Device KIT USE AS DIRECTED  . cetirizine (ZYRTEC) 10 MG tablet Take 10 mg by mouth.  . Cholecalciferol (VITAMIN D) 2000 units tablet Take by mouth.  . Garlic 2671 MG CAPS Take by mouth.  Marland Kitchen glucose blood (ONE TOUCH ULTRA TEST) test strip Use once daily. Dx code: E11.9 Onr touch Ultra blue  . metFORMIN (GLUCOPHAGE-XR) 500 MG 24 hr tablet Take 1 tablet (500 mg total) by mouth 2 (two) times daily.  . Multiple Vitamins-Minerals (MULTIVITAMIN WOMEN 50+ PO) Take 1 tablet by mouth daily.  . Omega-3 1000 MG CAPS Take by mouth.  Gabriela [name] DELICA LANCETS 24P MISC Use to check sugar daily  . tamoxifen (NOLVADEX) 20 MG tablet TAKE 1 TABLET BY MOUTH  DAILY  . traMADol (ULTRAM) 50 MG tablet Take 1 tablet (50 mg total) by mouth every 6 (six) hours as needed. (Patient not taking: Reported on 04/08/2018)   No facility-administered encounter medications on file as of 04/15/2018.    Current Outpatient Prescriptions   OBJECTIVE: Middle-aged white woman in no acute distress  Vitals:   04/15/18 1450  BP: 121/61  Pulse: 68  Resp: 18  Temp: 99.1 F (37.3 C)  TempSrc: Oral  SpO2: 98%  Weight: 184 lb 1.6 oz (83.5 kg)  Height: 5' 6.5" (1.689 m)   Sclerae unicteric, EOMs intact Oropharynx clear and moist No cervical or supraclavicular adenopathy Lungs no rales or rhonchi Heart regular rate and rhythm Abd soft, nontender,  positive bowel sounds MSK no focal spinal tenderness, no upper extremity lymphedema; there are significant varicosities in the left lower extremity, which are not new.  However the left lower extremity is also slightly larger than the right, approximately 5% and that is new. Neuro: nonfocal, well oriented, appropriate affect Breasts: The right breast is unremarkable.  The left breast is status post recent biopsy, as well as prior lumpectomy and radiation.  There are no findings of concern.  Both axillae are benign.   LAB RESULTS:  CMP     Component Value Date/Time   NA 138 02/20/2017 1149   K 4.7 02/20/2017 1149   CO2 27 02/20/2017 1149   GLUCOSE 192 (H) 02/20/2017 1149   BUN 14.7 02/20/2017 1149   CREATININE 0.9 02/20/2017 1149   CALCIUM 9.5 02/20/2017 1149   PROT 6.6 02/20/2017 1149   ALBUMIN 3.9 02/20/2017 1149   AST 16 02/20/2017 1149   ALT 12 02/20/2017 1149   ALKPHOS 19 (L) 02/20/2017 1149   BILITOT 0.60 02/20/2017 1149    INo results found for: SPEP, UPEP  Lab Results  Component Value Date   WBC 5.5 02/20/2017   NEUTROABS 4.2 02/20/2017   HGB 12.4 02/20/2017   HCT 36.9 02/20/2017   MCV 89.1 02/20/2017   PLT 199 02/20/2017      Chemistry      Component Value Date/Time   NA 138 02/20/2017 1149   K 4.7 02/20/2017 1149   CO2 27 02/20/2017 1149   BUN 14.7 02/20/2017 1149   CREATININE 0.9 02/20/2017 1149      Component Value Date/Time   CALCIUM 9.5 02/20/2017 1149   ALKPHOS 19 (L) 02/20/2017 1149   AST 16 02/20/2017 1149   ALT 12 02/20/2017 1149   BILITOT 0.60 02/20/2017 1149       No results found for: LABCA2  No components found for: LABCA125  No results for input(s): INR in the last 168 hours.  Urinalysis No results found for: COLORURINE, APPEARANCEUR, LABSPEC, PHURINE, GLUCOSEU, HGBUR, BILIRUBINUR, KETONESUR, PROTEINUR, UROBILINOGEN, NITRITE, LEUKOCYTESUR  STUDIES: Since her last visit, she underwent diagnostic bilateral mammography with CAD and  tomography and left breast ultrasonography on 02/07/2018 at [facility] showing: breast density category B. Developing area of architectural distortion in the outer left breast with a 6 mm probable correlating ill-defined mass in the 1:30 o'clock position. This is suspicious for malignancy.   This area was biopsied on 02/08/2018 showing fibrosis and no evidence of malignancy. She then underwent lumpectomy ([id number]) of this area on 03/08/2018 showing fibrosis and no malignancy.    ASSESSMENT: 72 y.o. [place] woman status post left breast upper outer quadrant biopsy 01/18/2015 for a clinical T1b N0, stage IA invasive ductal carcinoma, grade 1, estrogen and progesterone receptor positive,  HER-2 not amplified  (1) left lumpectomy and sentinel lymph node sampling 02/08/2015 showed a pT1b pN0, stage IA invasive ductal carcinoma, grade 1, with repeat HER-2 again negative. Margins were negative but close  (2) Oncotype DX showed a score of 14, predicting a risk of recurrence outside the breast in the next 10 years of 9% if the patient's only systemic treatment is tamoxifen for 5 years. It also protects no benefit from adjuvant chemotherapy.  (3) adjuvant [facility] 05/11/2015  (4) tamoxifen started 05/20/2015  (5) TAH-BSO in December 2016. Benign pathology.  (6) left breast upper outer quadrant biopsy 03/08/2018 with no evidence of malignancy  PLAN: Gabriela [name] is now just over 3 years out from definitive surgery for her breast cancer with no evidence of disease recurrence.  This is very favorable.  She is tolerating tamoxifen well and the plan will be to continue that a total of 3 years.  She had a minor trauma to her left lower extremity and there is a bit of swelling there.  I wonder if there is a clot.  We are setting her up for a Doppler ultrasonography of that leg.  This is nothing to do with her varicosities which are long-standing.  We are setting that up at  [facility] at her request  Because the trauma occurred about 3 weeks ago and if anything there is now lot less swelling per her account, we are not starting her on blood thinners prophylactically.  Otherwise she will return to see me in 1 year.  She knows to call for any other issues that may develop before the next visit.  Gabriela [name], Virgie Dad, MD  04/15/18 3:05 PM Medical Oncology and Hematology [facility] [address] [place],  [address] Tel. [phone]    Fax. [phone]  Alice Rieger, am acting as scribe for Chauncey Cruel MD.  I, Lurline Del MD, have reviewed the above documentation for accuracy and completeness, and I agree with the above.

## 2018-04-15 ENCOUNTER — Telehealth: Payer: Self-pay | Admitting: Oncology

## 2018-04-15 ENCOUNTER — Inpatient Hospital Stay: Payer: Medicare Other | Attending: Oncology | Admitting: Oncology

## 2018-04-15 ENCOUNTER — Inpatient Hospital Stay: Payer: Medicare Other

## 2018-04-15 VITALS — BP 121/61 | HR 68 | Temp 99.1°F | Resp 18 | Ht 66.5 in | Wt 184.1 lb

## 2018-04-15 DIAGNOSIS — M7989 Other specified soft tissue disorders: Secondary | ICD-10-CM | POA: Diagnosis not present

## 2018-04-15 DIAGNOSIS — E119 Type 2 diabetes mellitus without complications: Secondary | ICD-10-CM | POA: Diagnosis not present

## 2018-04-15 DIAGNOSIS — C50412 Malignant neoplasm of upper-outer quadrant of left female breast: Secondary | ICD-10-CM

## 2018-04-15 DIAGNOSIS — Z170 Estrogen receptor positive status [ER+]: Principal | ICD-10-CM

## 2018-04-15 DIAGNOSIS — Z7984 Long term (current) use of oral hypoglycemic drugs: Secondary | ICD-10-CM | POA: Insufficient documentation

## 2018-04-15 LAB — CBC WITH DIFFERENTIAL (CANCER CENTER ONLY)
BASOS ABS: 0 10*3/uL (ref 0.0–0.1)
Basophils Relative: 1 %
Eosinophils Absolute: 0.1 10*3/uL (ref 0.0–0.5)
Eosinophils Relative: 1 %
HEMATOCRIT: 35.2 % (ref 34.8–46.6)
Hemoglobin: 11.7 g/dL (ref 11.6–15.9)
Lymphocytes Relative: 20 %
Lymphs Abs: 1.3 10*3/uL (ref 0.9–3.3)
MCH: 29.4 pg (ref 25.1–34.0)
MCHC: 33.2 g/dL (ref 31.5–36.0)
MCV: 88.4 fL (ref 79.5–101.0)
MONO ABS: 0.4 10*3/uL (ref 0.1–0.9)
Monocytes Relative: 6 %
NEUTROS ABS: 4.8 10*3/uL (ref 1.5–6.5)
Neutrophils Relative %: 72 %
Platelet Count: 182 10*3/uL (ref 145–400)
RBC: 3.98 MIL/uL (ref 3.70–5.45)
RDW: 12.9 % (ref 11.2–14.5)
WBC: 6.5 10*3/uL (ref 3.9–10.3)

## 2018-04-15 LAB — CMP (CANCER CENTER ONLY)
ALBUMIN: 4 g/dL (ref 3.5–5.0)
ALT: 11 U/L (ref 0–55)
AST: 19 U/L (ref 5–34)
Alkaline Phosphatase: 23 U/L — ABNORMAL LOW (ref 40–150)
Anion gap: 7 (ref 3–11)
BILIRUBIN TOTAL: 0.2 mg/dL (ref 0.2–1.2)
BUN: 16 mg/dL (ref 7–26)
CO2: 29 mmol/L (ref 22–29)
Calcium: 9.4 mg/dL (ref 8.4–10.4)
Chloride: 102 mmol/L (ref 98–109)
Creatinine: 0.84 mg/dL (ref 0.60–1.10)
GFR, Est AFR Am: >60 mL/min (ref >60)
GFR, Estimated: >60 mL/min (ref >60)
GLUCOSE: 117 mg/dL (ref 70–140)
Potassium: 4 mmol/L (ref 3.5–5.1)
SODIUM: 138 mmol/L (ref 136–145)
TOTAL PROTEIN: 6.4 g/dL (ref 6.4–8.3)

## 2018-04-15 NOTE — Telephone Encounter (Signed)
Gave patient avs and calendar of upcoming June 2020 appointments.  °

## 2018-04-16 ENCOUNTER — Telehealth (INDEPENDENT_AMBULATORY_CARE_PROVIDER_SITE_OTHER): Payer: Self-pay

## 2018-04-16 ENCOUNTER — Encounter: Payer: Self-pay | Admitting: Family Medicine

## 2018-04-16 ENCOUNTER — Telehealth: Payer: Self-pay

## 2018-04-16 NOTE — Telephone Encounter (Signed)
Call made to [facility] for pt to have doppler.  Only VM picked up at office and had to LM for some one to call back concerning pt appt.  Spoke with daughter concerning pt needing doppler.  Explained that it would be checked on and she would be contacted by staff.

## 2018-04-16 NOTE — Telephone Encounter (Signed)
Cecille Rubin called and stated that she has sent over paperwork on this patient which lives here in [place] and she may have a blood clot. They are trying to get her in ASAP for a doppler?

## 2018-04-16 NOTE — Telephone Encounter (Signed)
The patient called to see if Dr. Marland KitchenM's" office had sent over the order for her to have a ultrasound done. After searching her record, there is no order neither is there an order for the patient to come in to be seen.  She states that she is in a lot of pain today and needs to know what else she can do. When I called her back, her mailbox was full and I couldn't leave her a message.

## 2018-04-17 ENCOUNTER — Telehealth: Payer: Self-pay

## 2018-04-17 ENCOUNTER — Ambulatory Visit (INDEPENDENT_AMBULATORY_CARE_PROVIDER_SITE_OTHER): Payer: Medicare Other | Admitting: Family Medicine

## 2018-04-17 ENCOUNTER — Encounter: Payer: Self-pay | Admitting: Family Medicine

## 2018-04-17 ENCOUNTER — Ambulatory Visit
Admission: RE | Admit: 2018-04-17 | Discharge: 2018-04-17 | Disposition: A | Discharge status: Home / Self Care | Payer: Medicare Other | Source: Ambulatory Visit | Attending: Family Medicine | Admitting: Family Medicine

## 2018-04-17 VITALS — BP 112/66 | HR 73 | Temp 97.7°F | Ht 66.5 in | Wt 181.2 lb

## 2018-04-17 DIAGNOSIS — M79662 Pain in left lower leg: Secondary | ICD-10-CM | POA: Diagnosis not present

## 2018-04-17 DIAGNOSIS — Z0000 Encounter for general adult medical examination without abnormal findings: Secondary | ICD-10-CM | POA: Diagnosis not present

## 2018-04-17 DIAGNOSIS — M5417 Radiculopathy, lumbosacral region: Secondary | ICD-10-CM | POA: Diagnosis not present

## 2018-04-17 DIAGNOSIS — Z23 Encounter for immunization: Secondary | ICD-10-CM | POA: Diagnosis not present

## 2018-04-17 DIAGNOSIS — E2839 Other primary ovarian failure: Secondary | ICD-10-CM | POA: Diagnosis not present

## 2018-04-17 DIAGNOSIS — Z1211 Encounter for screening for malignant neoplasm of colon: Secondary | ICD-10-CM | POA: Diagnosis not present

## 2018-04-17 NOTE — Telephone Encounter (Signed)
This nurse spoke with patient regarding appt scheduled with [facility] for 04/18/17 at 11 am for doppler.  Explained to pt unable to make appt with [facility] d/t staffing issues per center.  While speaking with pt, she was at her pcp and this nurse spoke with Stanton Kidney who was with pt.  Stanton Kidney stated that they were working on getting the doppler moved up to today as STAT if they could.

## 2018-04-17 NOTE — Telephone Encounter (Signed)
Result note generated to call patient.

## 2018-04-17 NOTE — Progress Notes (Signed)
Subjective:    Patient ID: Gabriela [name], female    DOB: 1946-04-20, 72 y.o.   MRN: [id number]  HPI This is a 72 yo female who presents today for CPE.   Is awaiting an appointment for ultrasound in left leg, discussed with Dr. Jana Hakim for suspected DVT. Do not see order in chart, some phone calls about scheduling have been made from his office, looks like it is not scheduled. Has had left sided calf pain for a couple of days.   Last CPE- ?07/14/16 Mammo- 02/07/18- ordered and followed by oncology Pap- hysterectomy Colonoscopy- thinks she is due, will put in referral today, can't find documentation in chart Tdap- 06/15/17 Flu- annual Eye- less than 1 year ago Dental- regular Exercise- gardening, yard work  Past Medical History:  Diagnosis Date  . Arthritis   . Cancer ([place])    left breast  . Chronic pain    from fall 2012  . Diabetes mellitus without complication (HCC)    diet controlled  . History of closed head injury 2012   accident work threw her 3ft  . Hyperlipemia   . Personal history of radiation therapy   . Spinal stenosis   . Urinary incontinence    wears a pessery   Past Surgical History:  Procedure Laterality Date  . ABDOMINAL HYSTERECTOMY    . BREAST LUMPECTOMY  1999   left  . BREAST LUMPECTOMY Left 2016  . BREAST LUMPECTOMY WITH RADIOACTIVE SEED LOCALIZATION Left 03/08/2018   Procedure: LEFT BREAST LUMPECTOMY WITH RADIOACTIVE SEED LOCALIZATION;  Surgeon: Excell Seltzer, MD;  Location: [facility];  Service: General;  Laterality: Left;  . COLONOSCOPY    . ORIF WRIST FRACTURE Right 06/15/2017   Procedure: OPEN REDUCTION INTERNAL FIXATION (ORIF) WRIST FRACTURE WITH REDUCTION AND PINNINGT OF CARPAL DISLOCATION;  Surgeon: Milly Jakob, MD;  Location: [facility];  Service: Orthopedics;  Laterality: Right;  . RADIOACTIVE SEED GUIDED PARTIAL MASTECTOMY WITH AXILLARY SENTINEL LYMPH NODE BIOPSY Left 02/08/2015   Procedure: RADIOACTIVE SEED LOCALIZATION  LUMPECTOMY WITH LEFT AXILLARY SENTINEL LYMPH NODE BIOPSY;  Surgeon: Excell Seltzer, MD;  Location: [facility];  Service: General;  Laterality: Left;  . SHOULDER ARTHROSCOPY  2012   left  . TONSILLECTOMY     No family history on file. Social History   Tobacco Use  . Smoking status: Never Smoker  . Smokeless tobacco: Never Used  Substance Use Topics  . Alcohol use: No  . Drug use: No      Review of Systems  Constitutional: Negative.   HENT: Negative.   Eyes: Negative.   Respiratory: Negative.   Cardiovascular: Negative.   Gastrointestinal: Negative.   Endocrine: Negative.   Genitourinary: Positive for frequency (for many years, drinks a lot of water).  Musculoskeletal:       Chronic left knee pain  Skin:       Toenails thickened, discolored, has seen podiatry in past with improvement.   Allergic/Immunologic: Negative.   Neurological: Negative.   Hematological: Negative.   Psychiatric/Behavioral: Negative.        Objective:   Physical Exam  Constitutional: She is oriented to person, place, and time. She appears well-developed and well-nourished. No distress.  HENT:  Head: Normocephalic and atraumatic.  Neck: Normal range of motion. Neck supple.  Cardiovascular: Normal rate, regular rhythm, normal heart sounds and intact distal pulses.  Pulmonary/Chest: Effort normal and breath sounds normal.  Abdominal: Soft. Bowel sounds are normal. She exhibits no distension and no  mass. There is no tenderness. There is no rebound and no guarding.  Musculoskeletal: She exhibits tenderness (mild over left lateral calf, no masses or redness. Negative homan's. ). She exhibits no edema.  Neurological: She is alert and oriented to person, place, and time.  Skin: Skin is warm and dry. She is not diaphoretic.  Thickened great toenails.  Resolving ecchymosis below left knee.   Psychiatric: She has a normal mood and affect. Her behavior is normal. Judgment and thought  content normal.  Vitals reviewed.     BP 112/66 (BP Location: Right Arm, Patient Position: Sitting, Cuff Size: Normal)   Pulse 73   Temp 97.7 F (36.5 C) (Oral)   Ht 5' 6.5" (1.689 m)   Wt 181 lb 4 oz (82.2 kg)   SpO2 99%   BMI 28.82 kg/m  Wt Readings from Last 3 Encounters:  04/17/18 181 lb 4 oz (82.2 kg)  04/15/18 184 lb 1.6 oz (83.5 kg)  04/08/18 181 lb 12 oz (82.4 kg)   Depression screen [facility] 2/9 04/17/2018 04/08/2018 12/03/2015  Decreased Interest 0 0 0  Down, Depressed, Hopeless 0 0 0  PHQ - 2 Score 0 0 0  Altered sleeping - 0 -  Tired, decreased energy - 0 -  Change in appetite - 0 -  Feeling bad or failure about yourself  - 0 -  Trouble concentrating - 0 -  Moving slowly or fidgety/restless - 0 -  Suicidal thoughts - 0 -  PHQ-9 Score - 0 -  Difficult doing work/chores - Not difficult at all -   Diabetic foot exam: Normal inspection No skin breakdown No calluses  Normal DP pulses Normal sensation to light touch and vibration Nails thickened great toenails     Assessment & Plan:  1. Annual physical exam - Discussed and encouraged healthy lifestyle choices- adequate sleep, regular exercise, stress management and healthy food choices.  - reviewed labs with patient - follow up in 6 months  2. Screening for colon cancer - Ambulatory referral to Gastroenterology  3. Pain of left calf - will attempt to get scheduled since it does not seem to be scheduled - US Venous Img Lower Unilateral Left; Future  4. Estrogen deficiency - DG Bone Density; Future  5. Need for pneumococcal vaccination - Pneumococcal conjugate vaccine 13-valent   Clarene Reamer, FNP-BC  [facility]  04/17/2018 8:59 AM

## 2018-04-17 NOTE — Telephone Encounter (Signed)
Ultrasound called report on lt lower leg [place]; negative for DVT; report is in Epic; pt has already gone home and is waiting on cb. Copy of report taken to Glenda Chroman FNP area.

## 2018-04-17 NOTE — Patient Instructions (Signed)
Great to see you today  Please stop at desk to schedule doppler ultrasound, bone density study.  The gastroenterologist's office will call you about screening colonoscopy  Please follow up with me in 6 months  Keeping You Healthy  Get These Tests  Blood Pressure- Have your blood pressure checked by your healthcare provider at least once a year.  Normal blood pressure is 120/80.  Weight- Have your body mass index (BMI) calculated to screen for obesity.  BMI is a measure of body fat based on height and weight.  You can calculate your own BMI at [employer]velB[employer].it  Cholesterol- Have your cholesterol checked every year.  Diabetes- Have your blood sugar checked every year if you have high blood pressure, high cholesterol, a family history of diabetes or if you are overweight.  Pap Test - Have a pap test every 1 to 5 years if you have been sexually active.  If you are older than 65 and recent pap tests have been normal you may not need additional pap tests.  In addition, if you have had a hysterectomy  for benign disease additional pap tests are not necessary.  Mammogram-Yearly mammograms are essential for early detection of breast cancer  Screening for Colon Cancer- Colonoscopy starting at age 61. Screening may begin sooner depending on your family history and other health conditions.  Follow up colonoscopy as directed by your Gastroenterologist.  Screening for Osteoporosis- Screening begins at age 64 with bone density scanning, sooner if you are at higher risk for developing Osteoporosis.  Get these medicines  Calcium with Vitamin D- Your body requires 1200-1500 mg of Calcium a day and [phone] IU of Vitamin D a day.  You can only absorb 500 mg of Calcium at a time therefore Calcium must be taken in 2 or 3 separate doses throughout the day.  Hormones- Hormone therapy has been associated with increased risk for certain cancers and heart disease.  Talk to your healthcare provider  about if you need relief from menopausal symptoms.  Aspirin- Ask your healthcare provider about taking Aspirin to prevent Heart Disease and Stroke.  Get these Immuniztions  Flu shot- Every fall  Pneumonia shot- Once after the age of 26; if you are younger ask your healthcare provider if you need a pneumonia shot.  Tetanus- Every ten years.  Zostavax- Once after the age of 103 to prevent shingles.  Take these steps  Don't smoke- Your healthcare provider can help you quit. For tips on how to quit, ask your healthcare provider or go to www.smokefree.gov or call 1-800 QUIT-NOW.  Be physically active- Exercise 5 days a week for a minimum of 30 minutes.  If you are not already physically active, start slow and gradually work up to 30 minutes of moderate physical activity.  Try walking, dancing, bike riding, swimming, etc.  Eat a healthy diet- Eat a variety of healthy foods such as fruits, vegetables, whole grains, low fat milk, low fat cheeses, yogurt, lean meats, chicken, fish, eggs, dried beans, tofu, etc.  For more information go to www.thenutritionsource.org  Dental visit- Brush and floss teeth twice daily; visit your dentist twice a year.  Eye exam- Visit your Optometrist or Ophthalmologist yearly.  Drink alcohol in moderation- Limit alcohol intake to one drink or less a day.  Never drink and drive.  Depression- Your emotional health is as important as your physical health.  If you're feeling down or losing interest in things you normally enjoy, please talk to your healthcare provider.  Seat Belts- can save your life; always wear one  Smoke/Carbon Monoxide detectors- These detectors need to be installed on the appropriate level of your home.  Replace batteries at least once a year.  Violence- If anyone is threatening or hurting you, please tell your healthcare provider.  Living Will/ Health care power of attorney- Discuss with your healthcare provider and family.

## 2018-04-18 ENCOUNTER — Ambulatory Visit: Payer: Medicare Other

## 2018-04-22 DIAGNOSIS — M5416 Radiculopathy, lumbar region: Secondary | ICD-10-CM | POA: Diagnosis not present

## 2018-04-22 DIAGNOSIS — M5136 Other intervertebral disc degeneration, lumbar region: Secondary | ICD-10-CM | POA: Diagnosis not present

## 2018-04-22 DIAGNOSIS — M48062 Spinal stenosis, lumbar region with neurogenic claudication: Secondary | ICD-10-CM | POA: Diagnosis not present

## 2018-04-23 ENCOUNTER — Ambulatory Visit: Payer: Medicare Other

## 2018-04-24 DIAGNOSIS — M5416 Radiculopathy, lumbar region: Secondary | ICD-10-CM | POA: Diagnosis not present

## 2018-04-24 DIAGNOSIS — M48062 Spinal stenosis, lumbar region with neurogenic claudication: Secondary | ICD-10-CM | POA: Diagnosis not present

## 2018-04-24 DIAGNOSIS — M5136 Other intervertebral disc degeneration, lumbar region: Secondary | ICD-10-CM | POA: Diagnosis not present

## 2018-04-25 ENCOUNTER — Telehealth: Payer: Self-pay | Admitting: Gastroenterology

## 2018-04-25 ENCOUNTER — Other Ambulatory Visit: Payer: Self-pay

## 2018-04-25 DIAGNOSIS — Z1211 Encounter for screening for malignant neoplasm of colon: Secondary | ICD-10-CM

## 2018-04-25 NOTE — Telephone Encounter (Signed)
Patient LVM that she is returning a call to schedule a colonoscopy °

## 2018-04-26 ENCOUNTER — Other Ambulatory Visit: Payer: Self-pay

## 2018-05-10 ENCOUNTER — Other Ambulatory Visit: Payer: Self-pay | Admitting: Oncology

## 2018-05-10 ENCOUNTER — Other Ambulatory Visit: Payer: Self-pay | Admitting: Internal Medicine

## 2018-05-14 ENCOUNTER — Ambulatory Visit: Payer: Medicare Other | Admitting: Certified Registered Nurse Anesthetist

## 2018-05-14 ENCOUNTER — Encounter: Payer: Self-pay | Admitting: Certified Registered Nurse Anesthetist

## 2018-05-14 ENCOUNTER — Ambulatory Visit
Admission: RE | Admit: 2018-05-14 | Discharge: 2018-05-14 | Disposition: A | Discharge status: Home / Self Care | Payer: Medicare Other | Source: Ambulatory Visit | Attending: Gastroenterology | Admitting: Gastroenterology

## 2018-05-14 ENCOUNTER — Encounter
Admission: RE | Disposition: A | Discharge status: Home / Self Care | Payer: Self-pay | Source: Ambulatory Visit | Attending: Gastroenterology

## 2018-05-14 DIAGNOSIS — Z882 Allergy status to sulfonamides status: Secondary | ICD-10-CM | POA: Insufficient documentation

## 2018-05-14 DIAGNOSIS — Z7984 Long term (current) use of oral hypoglycemic drugs: Secondary | ICD-10-CM | POA: Insufficient documentation

## 2018-05-14 DIAGNOSIS — Z79899 Other long term (current) drug therapy: Secondary | ICD-10-CM | POA: Diagnosis not present

## 2018-05-14 DIAGNOSIS — M1990 Unspecified osteoarthritis, unspecified site: Secondary | ICD-10-CM | POA: Insufficient documentation

## 2018-05-14 DIAGNOSIS — D122 Benign neoplasm of ascending colon: Secondary | ICD-10-CM | POA: Diagnosis not present

## 2018-05-14 DIAGNOSIS — Z885 Allergy status to narcotic agent status: Secondary | ICD-10-CM | POA: Diagnosis not present

## 2018-05-14 DIAGNOSIS — K5730 Diverticulosis of large intestine without perforation or abscess without bleeding: Secondary | ICD-10-CM | POA: Diagnosis not present

## 2018-05-14 DIAGNOSIS — K635 Polyp of colon: Secondary | ICD-10-CM | POA: Diagnosis not present

## 2018-05-14 DIAGNOSIS — E785 Hyperlipidemia, unspecified: Secondary | ICD-10-CM | POA: Insufficient documentation

## 2018-05-14 DIAGNOSIS — Z1211 Encounter for screening for malignant neoplasm of colon: Secondary | ICD-10-CM | POA: Diagnosis not present

## 2018-05-14 DIAGNOSIS — E119 Type 2 diabetes mellitus without complications: Secondary | ICD-10-CM | POA: Diagnosis not present

## 2018-05-14 DIAGNOSIS — Z923 Personal history of irradiation: Secondary | ICD-10-CM | POA: Insufficient documentation

## 2018-05-14 DIAGNOSIS — Z853 Personal history of malignant neoplasm of breast: Secondary | ICD-10-CM | POA: Diagnosis not present

## 2018-05-14 DIAGNOSIS — K5790 Diverticulosis of intestine, part unspecified, without perforation or abscess without bleeding: Secondary | ICD-10-CM | POA: Diagnosis not present

## 2018-05-14 HISTORY — PX: COLONOSCOPY WITH PROPOFOL: SHX5780

## 2018-05-14 LAB — GLUCOSE, CAPILLARY: Glucose-Capillary: 93 mg/dL (ref 70–99)

## 2018-05-14 SURGERY — COLONOSCOPY WITH PROPOFOL
Anesthesia: General

## 2018-05-14 MED ORDER — PROPOFOL 10 MG/ML IV BOLUS
INTRAVENOUS | Status: DC | PRN
  Administered 2018-05-14: 70 mg via INTRAVENOUS

## 2018-05-14 MED ORDER — PROPOFOL 500 MG/50ML IV EMUL
INTRAVENOUS | Status: DC | PRN
  Administered 2018-05-14: 150 ug/kg/min via INTRAVENOUS

## 2018-05-14 MED ORDER — SODIUM CHLORIDE 0.9 % IV SOLN
INTRAVENOUS | Status: DC
  Administered 2018-05-14: 10:00:00 via INTRAVENOUS

## 2018-05-14 MED ORDER — LIDOCAINE HCL (CARDIAC) PF 100 MG/5ML IV SOSY
PREFILLED_SYRINGE | INTRAVENOUS | Status: DC | PRN
  Administered 2018-05-14: 50 mg via INTRAVENOUS

## 2018-05-14 MED ORDER — PROPOFOL 500 MG/50ML IV EMUL
INTRAVENOUS | Status: AC
  Filled 2018-05-14: qty 50

## 2018-05-14 NOTE — H&P (Signed)
Jonathon Bellows, MD [address], [place], [place], [place], [address] [address], [place], [place], [place], [address] Phone: [phone]  Fax: [phone]  Primary Care Physician:  [name], FNP   Pre-Procedure History & Physical: HPI:  Gabriela [name] is a 72 y.o. female is here for an colonoscopy.   Past Medical History:  Diagnosis Date  . Arthritis   . Cancer ([place])    left breast  . Chronic pain    from fall 2012  . Diabetes mellitus without complication (HCC)    diet controlled  . History of closed head injury 2012   accident work threw her 36f  . Hyperlipemia   . Personal history of radiation therapy   . Spinal stenosis   . Urinary incontinence    wears a pessery    Past Surgical History:  Procedure Laterality Date  . ABDOMINAL HYSTERECTOMY    . BREAST LUMPECTOMY  1999   left  . BREAST LUMPECTOMY Left 2016  . BREAST LUMPECTOMY WITH RADIOACTIVE SEED LOCALIZATION Left 03/08/2018   Procedure: LEFT BREAST LUMPECTOMY WITH RADIOACTIVE SEED LOCALIZATION;  Surgeon: HExcell Seltzer MD;  [facility]  Service: General;  Laterality: Left;  . COLONOSCOPY    . ORIF WRIST FRACTURE Right 06/15/2017   Procedure: OPEN REDUCTION INTERNAL FIXATION (ORIF) WRIST FRACTURE WITH REDUCTION AND PINNINGT OF CARPAL DISLOCATION;  Surgeon: TMilly Jakob MD;  [facility]  Service: Orthopedics;  Laterality: Right;  . RADIOACTIVE SEED GUIDED PARTIAL MASTECTOMY WITH AXILLARY SENTINEL LYMPH NODE BIOPSY Left 02/08/2015   Procedure: RADIOACTIVE SEED LOCALIZATION LUMPECTOMY WITH LEFT AXILLARY SENTINEL LYMPH NODE BIOPSY;  Surgeon: BExcell Seltzer MD;  [facility]  Service: General;  Laterality: Left;  . SHOULDER ARTHROSCOPY  2012   left  . TONSILLECTOMY      Prior to Admission medications   Medication Sig Start Date End Date Taking? Authorizing Provider  Blood Glucose Monitoring Suppl (ONE TOUCH ULTRA 2) w/Device KIT  USE AS DIRECTED 11/24/16  Yes [provider]  cetirizine (ZYRTEC) 10 MG tablet Take 10 mg by mouth.   Yes [provider]  Cholecalciferol (VITAMIN D) 2000 units tablet Take by mouth.   Yes [provider]  Garlic 10354MG CAPS Take by mouth.   Yes [provider]  glucose blood (ONE TOUCH ULTRA TEST) test strip Use once daily. Dx code: E11.9 Onr touch Ultra blue 06/04/17  Yes GPhilemon Kingdom MD  metFORMIN (GLUCOPHAGE-XR) 500 MG 24 hr tablet TAKE 1 TABLET BY MOUTH TWO  TIMES DAILY 05/10/18  Yes GPhilemon Kingdom MD  Multiple Vitamins-Minerals (MULTIVITAMIN WOMEN 50+ PO) Take 1 tablet by mouth daily.   Yes [provider]  Omega-3 1000 MG CAPS Take by mouth.   Yes [provider]  OJonetta SpeakLANCETS 365KMISC Use to check sugar daily 06/04/17  Yes GPhilemon Kingdom MD  tamoxifen (NOLVADEX) 20 MG tablet TAKE 1 TABLET BY MOUTH  DAILY 05/10/18  Yes Magrinat, GVirgie Dad MD  traMADol (ULTRAM) 50 MG tablet Take 1 tablet (50 mg total) by mouth every 6 (six) hours as needed. Patient not taking: Reported on 05/14/2018 03/08/18   HExcell Seltzer MD    Allergies as of 04/25/2018 - Review Complete 04/17/2018  Allergen Reaction Noted  . Oxycontin [oxycodone hcl] Shortness Of Breath 02/08/2015  . Sulfa antibiotics Itching 02/04/2015    History reviewed. No pertinent family history.  Social History   Socioeconomic History  . Marital  status: Married    Spouse name: Not on file  . Number of children: Not on file  . Years of education: Not on file  . Highest education level: Not on file  Occupational History  . Not on file  Social Needs  . Financial resource strain: Not on file  . Food insecurity:    Worry: Not on file    Inability: Not on file  . Transportation needs:    Medical: Not on file    Non-medical: Not on file  Tobacco Use  . Smoking status: Never Smoker  . Smokeless tobacco: Never Used  Substance and Sexual Activity  . Alcohol  use: No  . Drug use: No  . Sexual activity: Not on file  Lifestyle  . Physical activity:    Days per week: Not on file    Minutes per session: Not on file  . Stress: Not on file  Relationships  . Social connections:    Talks on phone: Not on file    Gets together: Not on file    Attends religious service: Not on file    Active member of club or organization: Not on file    Attends meetings of clubs or organizations: Not on file    Relationship status: Not on file  . Intimate partner violence:    Fear of current or ex partner: Not on file    Emotionally abused: Not on file    Physically abused: Not on file    Forced sexual activity: Not on file  Other Topics Concern  . Not on file  Social History Narrative   The patient and her husband Dominica Severin are both retired. They live on [address] acres in our quite busy caring for that. At is also Nesiah's chief source of exercise. Daughter Jamesetta So is an occupational Programmer, applications in [place]. Daughter Rolly Salter is a Cabin crew in [place]. Son Bessye Stith works as a Librarian, academic for the [employer] in [place]. The patient has 4 grandchildren. She is a Psychologist, forensic.    Review of Systems: See HPI, otherwise negative ROS  Physical Exam: BP 140/66   Pulse 64   Temp (!) 97 F (36.1 C) (Tympanic)   Resp 16   Ht _0  (1.702 m)   Wt 175 lb (79.4 kg)   SpO2 100%   BMI 27.41 kg/m  General:   Alert,  pleasant and cooperative in NAD Head:  Normocephalic and atraumatic. Neck:  Supple; no masses or thyromegaly. Lungs:  Clear throughout to auscultation, normal respiratory effort.    Heart:  +S1, +S2, Regular rate and rhythm, No edema. Abdomen:  Soft, nontender and nondistended. Normal bowel sounds, without guarding, and without rebound.   Neurologic:  Alert and  oriented x4;  grossly normal neurologically.  Impression/Plan: Gabriela [name] is here for an colonoscopy to be performed for Screening colonoscopy average risk   Risks,  benefits, limitations, and alternatives regarding  colonoscopy have been reviewed with the patient.  Questions have been answered.  All parties agreeable.   Jonathon Bellows, MD  05/14/2018, 10:08 AM

## 2018-05-14 NOTE — Op Note (Signed)
[facility] Patient Name: Gabriela [name] Procedure Date: 05/14/2018 10:17 AM MRN: [id number] Account #: [id number] Date of Birth: 31-Aug-1946 Admit Type: Outpatient Age: 72 Room: [facility] ENDO ROOM 1 Gender: Female Note Status: Finalized Procedure:            Colonoscopy Indications:          Screening for colorectal malignant neoplasm Providers:            Jonathon Bellows MD, MD Referring MD:         [name] (Referring MD) Medicines:            Monitored Anesthesia Care Complications:        No immediate complications. Procedure:            Pre-Anesthesia Assessment:                       - Prior to the procedure, a History and Physical was                        performed, and patient medications, allergies and                        sensitivities were reviewed. The patient's tolerance of                        previous anesthesia was reviewed.                       - The risks and benefits of the procedure and the                        sedation options and risks were discussed with the                        patient. All questions were answered and informed                        consent was obtained.                       - ASA Grade Assessment: II - A patient with mild                        systemic disease.                       After obtaining informed consent, the colonoscope was                        passed under direct vision. Throughout the procedure,                        the patient's blood pressure, pulse, and oxygen                        saturations were monitored continuously. The                        Colonoscope was introduced through the anus and  advanced to the the cecum, identified by the                        appendiceal orifice, IC valve and transillumination.                        The colonoscopy was performed with ease. The patient                        tolerated the procedure well. The  quality of the bowel                        preparation was good. Findings:      The perianal and digital rectal examinations were normal.      A 4 mm polyp was found in the ascending colon. The polyp was sessile.       The polyp was removed with a cold biopsy forceps. Resection and       retrieval were complete.      Multiple small-mouthed diverticula were found in the entire colon.      The exam was otherwise without abnormality. Impression:           - One 4 mm polyp in the ascending colon, removed with a                        cold biopsy forceps. Resected and retrieved.                       - Diverticulosis in the entire examined colon.                       - The examination was otherwise normal. Recommendation:       - Discharge patient to home (with escort).                       - Resume previous diet.                       - Continue present medications.                       - Await pathology results.                       - Repeat colonoscopy in 5-10 years for surveillance                        based on pathology results. Procedure Code(s):    --- Professional ---                       [phone], Colonoscopy, flexible; with biopsy, single or                        multiple Diagnosis Code(s):    --- Professional ---                       Z12.11, Encounter for screening for malignant neoplasm                        of colon  D12.2, Benign neoplasm of ascending colon                       K57.30, Diverticulosis of large intestine without                        perforation or abscess without bleeding CPT copyright 2017 American Medical Association. All rights reserved. The codes documented in this report are preliminary and upon coder review may  be revised to meet current compliance requirements. Jonathon Bellows, MD Jonathon Bellows MD, MD 05/14/2018 10:51:09 AM This report has been signed electronically. Number of Addenda: 0 Note Initiated On: 05/14/2018 10:17  AM Scope Withdrawal Time: 0 hours 14 minutes 15 seconds  Total Procedure Duration: 0 hours 22 minutes 14 seconds       [facility]

## 2018-05-14 NOTE — Anesthesia Postprocedure Evaluation (Signed)
Anesthesia Post Note  Patient: Gabriela [name]  Procedure(s) Performed: COLONOSCOPY WITH PROPOFOL (N/A )  Patient location during evaluation: PACU Anesthesia Type: General Level of consciousness: awake and alert and oriented Pain management: pain level controlled Vital Signs Assessment: post-procedure vital signs reviewed and stable Respiratory status: spontaneous breathing Cardiovascular status: blood pressure returned to baseline Anesthetic complications: no     Last Vitals:  Vitals:   05/14/18 1000 05/14/18 1052  BP: 140/66 (!) 105/57  Pulse: 64 71  Resp: 16 16  Temp: (!) 36.1 C (!) 36.2 C  SpO2: 100% 100%    Last Pain:  Vitals:   05/14/18 1052  TempSrc: Tympanic  PainSc: 0-No pain                 Manasa Spease

## 2018-05-14 NOTE — Anesthesia Procedure Notes (Signed)
Date/Time: 05/14/2018 10:25 AM Performed by: Johnna Acosta, CRNA Pre-anesthesia Checklist: Patient identified, Emergency Drugs available, Suction available, Patient being monitored and Timeout performed Patient Re-evaluated:Patient Re-evaluated prior to induction Oxygen Delivery Method: Nasal cannula Preoxygenation: Pre-oxygenation with 100% oxygen

## 2018-05-14 NOTE — Anesthesia Post-op Follow-up Note (Signed)
Anesthesia QCDR form completed.        

## 2018-05-14 NOTE — Anesthesia Preprocedure Evaluation (Signed)
Anesthesia Evaluation  Patient identified by MRN, date of birth, ID band Patient awake    Reviewed: Allergy & Precautions, NPO status , Patient's Chart, lab work & pertinent test results  Airway Mallampati: II  TM Distance: >3 FB Neck ROM: Full    Dental  (+) Teeth Intact, Dental Advisory Given   Pulmonary neg pulmonary ROS,    breath sounds clear to auscultation       Cardiovascular  Rhythm:Regular Rate:Normal     Neuro/Psych  Neuromuscular disease negative psych ROS   GI/Hepatic Neg liver ROS,   Endo/Other  diabetes, Well Controlled, Type 2, Oral Hypoglycemic Agents  Renal/GU negative Renal ROS Bladder dysfunction      Musculoskeletal  (+) Arthritis , Osteoarthritis,    Abdominal   Peds negative pediatric ROS (+)  Hematology negative hematology ROS (+)   Anesthesia Other Findings   Reproductive/Obstetrics                             Anesthesia Physical  Anesthesia Plan  ASA: III  Anesthesia Plan: General   Post-op Pain Management:    Induction: Intravenous  PONV Risk Score and Plan: Ondansetron and TIVA  Airway Management Planned: Nasal Cannula  Additional Equipment:   Intra-op Plan:   Post-operative Plan:   Informed Consent: I have reviewed the patients History and Physical, chart, labs and discussed the procedure including the risks, benefits and alternatives for the proposed anesthesia with the patient or authorized representative who has indicated his/her understanding and acceptance.   Dental advisory given  Plan Discussed with: CRNA, Anesthesiologist and Surgeon  Anesthesia Plan Comments:         Anesthesia Quick Evaluation

## 2018-05-14 NOTE — Anesthesia Post-op Follow-up Note (Deleted)
Anesthesia QCDR form completed.        

## 2018-05-14 NOTE — Transfer of Care (Signed)
Immediate Anesthesia Transfer of Care Note  Patient: Gabriela [name]  Procedure(s) Performed: COLONOSCOPY WITH PROPOFOL (N/A )  Patient Location: PACU  Anesthesia Type:General  Level of Consciousness: awake, alert  and oriented  Airway & Oxygen Therapy: Patient Spontanous Breathing and Patient connected to nasal cannula oxygen  Post-op Assessment: Report given to RN and Post -op Vital signs reviewed and stable  Post vital signs: Reviewed and stable  Last Vitals:  Vitals Value Taken Time  BP 105/57 05/14/2018 10:53 AM  Temp 36.2 C 05/14/2018 10:52 AM  Pulse 69 05/14/2018 10:53 AM  Resp 22 05/14/2018 10:53 AM  SpO2 100 % 05/14/2018 10:53 AM  Vitals shown include unvalidated device data.  Last Pain:  Vitals:   05/14/18 1052  TempSrc: Tympanic  PainSc: 0-No pain         Complications: No apparent anesthesia complications

## 2018-05-15 ENCOUNTER — Encounter: Payer: Self-pay | Admitting: Gastroenterology

## 2018-05-15 LAB — SURGICAL PATHOLOGY

## 2018-05-16 ENCOUNTER — Encounter: Payer: Self-pay | Admitting: Internal Medicine

## 2018-05-16 ENCOUNTER — Ambulatory Visit: Payer: Medicare Other | Admitting: Internal Medicine

## 2018-05-16 VITALS — BP 128/78 | HR 74 | Ht 67.0 in | Wt 177.2 lb

## 2018-05-16 DIAGNOSIS — E785 Hyperlipidemia, unspecified: Secondary | ICD-10-CM | POA: Diagnosis not present

## 2018-05-16 DIAGNOSIS — E119 Type 2 diabetes mellitus without complications: Secondary | ICD-10-CM | POA: Diagnosis not present

## 2018-05-16 DIAGNOSIS — G629 Polyneuropathy, unspecified: Secondary | ICD-10-CM | POA: Insufficient documentation

## 2018-05-16 MED ORDER — GLUCOSE BLOOD VI STRP: ORAL_STRIP | 3 refills | Status: DC

## 2018-05-16 NOTE — Patient Instructions (Addendum)
Please continue: - Metformin ER 500 mg 2x a day with meals.  Restart the following combination for neuropathy: - alpha-lipoic acid 600 mg twice a day - B complex 1 tablet once a day  Please return in 6 months with your sugar log

## 2018-05-16 NOTE — Progress Notes (Signed)
Patient ID: JONISE WEIGHTMAN, female   DOB: 03-12-1946, 72 y.o.   MRN: [id number]   HPI: EMALYNN CLEWIS is a 72 y.o.-year-old female, returning for f/u for DM2, dx in 04/2014, non-insulin-dependent, controlled, without long-term complications. Last visit 6 months ago.  Last steroid injection: 04/2018. She hopes this would be her last one.  Last hemoglobin A1c was: Lab Results  Component Value Date   HGBA1C 6.4 04/08/2018   HGBA1C 6 11/16/2017   HGBA1C 5.9 05/16/2017   HGBA1C 5.8 12/27/2016  08/08/2016: HbA1c 7.0% 02/04/2016: HbA1c 6.7% 05/12/2015: HbA1c 6.3% 11/17/2014: HbA1c 6.1% 08/07/2014: HbA1c 6.4%  Pt is on: - Metformin ER 500 mg 2x a day She could not tolerate Metformin IR: HA, reflux, borborygmi  Pt checks her sugars 1X a day: - am: 130s-160, 170 >> n/c  - 2h after b'fast: 113-159 >> 91-137 >> 95-122, 130 >> 96-120 - before lunch: n/c  - 2h after lunch:105-139 >> 87-123 >> 87-123 >> 98-120 - before dinner: n/c - 2h after dinner:95-119, 247 (steroids) >> 93-131 >> 95-135, 201 (steroid inj) - bedtime: n/c - nighttime: n/c Lowest sugar was 87 >> 95; it is unclear at which level she has hypoglycemia awareness cemia awareness. Highest sugar was 180 (steroids) >> 201 (steroid inj)  Glucometer: One Touch Ultra  Pt's meals are: - Breakfast: multi-grain sandwich with egg salad - Lunch: may skip; multi-grain crackers with salad or homemade pimento cheese - Dinner: grilled or broiled meat and vegetable - Snacks: apple/pear; almonds/cashews  - No CKD, last BUN/creatinine:  Lab Results  Component Value Date   BUN 16 04/15/2018   BUN 14 04/08/2018   CREATININE 0.84 04/15/2018   CREATININE 0.82 04/08/2018   -+ HL;  last set of lipids: Lab Results  Component Value Date   CHOL 169 09/19/2017   HDL 48.40 09/19/2017   LDLCALC 102 (H) 09/19/2017   TRIG 92.0 09/19/2017   CHOLHDL 3 09/19/2017  02/04/2016: 181/84/43/121 She is not on a statin, but on omega-3 fatty acids  (1200 mg twice a day), and also garlic. - last eye exam was 05/2017: No DR.  She has left eye cataract.  Coming up in 07/2018. -No numbness and tingling in her feet.  She is off alpha lipoic acid and B complex.   She has a history of subdural hematoma 6 years ago after an accident when she was working at [employer]. Since then, she has hand tremors. In 2016 she has been dx'ed with BrCA and also had a total hysterectomy and a pelvic repair surgery.   She works in her garden and tried to walk in the mall with her husband.  ROS: Constitutional: no weight gain/no weight loss, no fatigue, no subjective hyperthermia, no subjective hypothermia Eyes: no blurry vision, no xerophthalmia ENT: no sore throat, no nodules palpated in throat, no dysphagia, no odynophagia, no hoarseness Cardiovascular: no CP/no SOB/no palpitations/no leg swelling Respiratory: no cough/no SOB/no wheezing Gastrointestinal: no N/no V/no D/no C/no acid reflux Musculoskeletal: no muscle aches/no joint aches Skin: no rashes, no hair loss Neurological: no tremors/no numbness/no tingling/no dizziness  I reviewed pt's medications, allergies, PMH, social hx, family hx, and changes were documented in the history of present illness. Otherwise, unchanged from my initial visit note. Stopped tramadol.  Past Medical History:  Diagnosis Date  . Arthritis   . Cancer ([place])    left breast  . Chronic pain    from fall 2012  . Diabetes mellitus without complication ([place])    diet  controlled  . History of closed head injury 2012   accident work threw her 66f  . Hyperlipemia   . Personal history of radiation therapy   . Spinal stenosis   . Urinary incontinence    wears a pessery   Past Surgical History:  Procedure Laterality Date  . ABDOMINAL HYSTERECTOMY    . BREAST LUMPECTOMY  1999   left  . BREAST LUMPECTOMY Left 2016  . BREAST LUMPECTOMY WITH RADIOACTIVE SEED LOCALIZATION Left 03/08/2018   Procedure: LEFT BREAST LUMPECTOMY WITH  RADIOACTIVE SEED LOCALIZATION;  Surgeon: HExcell Seltzer MD;  [place] [facility]  Service: General;  Laterality: Left;  . COLONOSCOPY    . COLONOSCOPY WITH PROPOFOL N/A 05/14/2018   Procedure: COLONOSCOPY WITH PROPOFOL;  Surgeon: AJonathon Bellows MD;  [facility]  Service: Gastroenterology;  Laterality: N/A;  . ORIF WRIST FRACTURE Right 06/15/2017   Procedure: OPEN REDUCTION INTERNAL FIXATION (ORIF) WRIST FRACTURE WITH REDUCTION AND PINNINGT OF CARPAL DISLOCATION;  Surgeon: TMilly Jakob MD;  [place] [facility]  Service: Orthopedics;  Laterality: Right;  . RADIOACTIVE SEED GUIDED PARTIAL MASTECTOMY WITH AXILLARY SENTINEL LYMPH NODE BIOPSY Left 02/08/2015   Procedure: RADIOACTIVE SEED LOCALIZATION LUMPECTOMY WITH LEFT AXILLARY SENTINEL LYMPH NODE BIOPSY;  Surgeon: BExcell Seltzer MD;  [place] [facility]  Service: General;  Laterality: Left;  . SHOULDER ARTHROSCOPY  2012   left  . TONSILLECTOMY     Social History   Social History  . Marital status: Married    Spouse name: N/A  . Number of children: 3   Occupational History  . retired   Social History Main Topics  . Smoking status: Never Smoker  . Smokeless tobacco: Never Used  . Alcohol use No  . Drug use: No   Current Outpatient Medications on File Prior to Visit  Medication Sig Dispense Refill  . Blood Glucose Monitoring Suppl (ONE TOUCH ULTRA 2) w/Device KIT USE AS DIRECTED    . cetirizine (ZYRTEC) 10 MG tablet Take 10 mg by mouth.    . Cholecalciferol (VITAMIN D) 2000 units tablet Take by mouth.    . Garlic 16222MG CAPS Take by mouth.    .Marland Kitchenglucose blood (ONE TOUCH ULTRA TEST) test strip Use once daily. Dx code: E11.9 Onr touch Ultra blue 100 each 5  . metFORMIN (GLUCOPHAGE-XR) 500 MG 24 hr tablet TAKE 1 TABLET BY MOUTH TWO  TIMES DAILY 180 tablet 3  . Multiple Vitamins-Minerals (MULTIVITAMIN WOMEN 50+ PO) Take 1 tablet by mouth daily.    . Omega-3 1000 MG CAPS Take by mouth.     .Glory RosebushDELICA LANCETS 397LMISC Use to check sugar daily 100 each 5  . tamoxifen (NOLVADEX) 20 MG tablet TAKE 1 TABLET BY MOUTH  DAILY 30 tablet 0  . traMADol (ULTRAM) 50 MG tablet Take 1 tablet (50 mg total) by mouth every 6 (six) hours as needed. (Patient not taking: Reported on 05/14/2018) 10 tablet 1   No current facility-administered medications on file prior to visit.    Allergies  Allergen Reactions  . Oxycontin [Oxycodone Hcl] Shortness Of Breath  . Sulfa Antibiotics Itching   Family history: - DM in sister, MGM.  PE: BP 128/78 (BP Location: Right Arm, Patient Position: Sitting, Cuff Size: Normal)   Pulse 74   Ht '5\' 7"'  (1.702 m)   Wt 177 lb 3.2 oz (80.4 kg)   SpO2 98%   BMI 27.75 kg/m  Wt Readings from Last 3 Encounters:  05/16/18 177  lb 3.2 oz (80.4 kg)  05/14/18 175 lb (79.4 kg)  04/17/18 181 lb 4 oz (82.2 kg)   Constitutional: overweight, in NAD Eyes: PERRLA, EOMI, no exophthalmos ENT: moist mucous membranes, no thyromegaly, no cervical lymphadenopathy Cardiovascular: RRR, No MRG Respiratory: CTA B Gastrointestinal: abdomen soft, NT, ND, BS+ Musculoskeletal: no deformities, strength intact in all 4 Skin: moist, warm, no rashes Neurological: no tremor with outstretched hands, DTR normal in all 4  ASSESSMENT: 1. DM2, non-insulin-dependent, controlled, without long-term complications  2. HL  3. PN  PLAN:  1. Pt with well-controlled type 2 diabetes, on low-dose metformin ER.  At last visit, sugars were at goal with the exception of the time when she had a steroid injection.  She usually has steroid injections in her spine 6 months apart. - At this visit, we reviewed her carefully kept log together.  Sugars are all at goal. Also, HbA1c checked last month was higher, at 6.4% (but still at goal) - No need to change her regimen for now - She has no side effects from metformin - I suggested to:  Patient Instructions  Please continue: - Metformin ER 500 mg 2x a  day with meals.  Restart the following combination for neuropathy: - alpha-lipoic acid 600 mg twice a day - B complex 1 tablet once a day  Please return in 6 months with your sugar log  - continue checking sugars at different times of the day - check 1x a day, rotating checks - advised for yearly eye exams >> she is UTD - Return to clinic in 6 mo with sugar log   2. HL - Reviewed latest lipid panel from 09/2017: LDL slightly above target - Not on a statin, but takes omega-3 fatty acids and garlic  3. PN - she ran out of ALA and B complex >> discussed to restart this  Philemon Kingdom, MD PhD [facility] Endocrinology

## 2018-05-18 ENCOUNTER — Encounter: Payer: Self-pay | Admitting: Gastroenterology

## 2018-05-29 ENCOUNTER — Ambulatory Visit
Admission: RE | Admit: 2018-05-29 | Discharge: 2018-05-29 | Disposition: A | Discharge status: Home / Self Care | Payer: Medicare Other | Source: Ambulatory Visit | Attending: Family Medicine | Admitting: Family Medicine

## 2018-05-29 ENCOUNTER — Encounter: Payer: Self-pay | Admitting: Family Medicine

## 2018-05-29 DIAGNOSIS — E2839 Other primary ovarian failure: Secondary | ICD-10-CM

## 2018-05-29 DIAGNOSIS — Z780 Asymptomatic menopausal state: Secondary | ICD-10-CM | POA: Diagnosis not present

## 2018-07-11 ENCOUNTER — Telehealth: Payer: Self-pay

## 2018-07-11 NOTE — Telephone Encounter (Signed)
Copied from [place] [id number]. Topic: General - Other >> Jul 11, 2018  2:43 PM Alfredia Ferguson R wrote: Pt daughter Rolly Salter is requesting a call from a nurse to give her some advice on how to approach her mom without scaring her. And also want to discuss some of her moms memory loss  Cb# [phone]

## 2018-07-11 NOTE — Telephone Encounter (Signed)
I tried to call Marcie Bal (NO DPR signed) and got recording due to network difficulties can not connect the call and can try the call later.

## 2018-07-12 NOTE — Telephone Encounter (Signed)
Spoke with daughter her concern is her age. There are things that concern her with her mother. 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 She just want to know if she is able to ask you questions or if you have a series of questions that you would ask a 72 year old to see if she has memory problems.

## 2018-07-12 NOTE — Telephone Encounter (Signed)
Please call daughter and see what her concerns are. No need to disclose patient information since daughter not on DPR. We can see patient for sooner follow up to evaluate and maybe daughter can accompany her mother if she agrees.

## 2018-07-12 NOTE — Telephone Encounter (Signed)
Daughter is not on DPR is it okay to call her back ?

## 2018-07-15 ENCOUNTER — Other Ambulatory Visit: Payer: Self-pay | Admitting: Internal Medicine

## 2018-07-15 NOTE — Telephone Encounter (Signed)
Per CMA, daughter did not think that her mother needed a sooner appointment and she did not want her mother to know that she had called. She stated she would reach out to her sister to see if she could accompany her mother to her next appointment. The patient has a medicare AWV in a couple of months and memory will be evaluated then.

## 2018-07-17 ENCOUNTER — Other Ambulatory Visit: Payer: Self-pay | Admitting: *Deleted

## 2018-07-17 MED ORDER — TAMOXIFEN CITRATE 20 MG PO TABS: 20.0000 mg | ORAL_TABLET | Freq: Every day | ORAL | 3 refills | Status: DC

## 2018-09-05 ENCOUNTER — Encounter: Payer: Self-pay | Admitting: Internal Medicine

## 2018-09-05 DIAGNOSIS — E119 Type 2 diabetes mellitus without complications: Secondary | ICD-10-CM | POA: Diagnosis not present

## 2018-09-05 LAB — HM DIABETES EYE EXAM

## 2018-09-17 ENCOUNTER — Encounter: Payer: Self-pay | Admitting: Family Medicine

## 2018-09-28 ENCOUNTER — Emergency Department
Admission: EM | Admit: 2018-09-28 | Discharge: 2018-09-28 | Disposition: A | Discharge status: Home / Self Care | Payer: Medicare Other | Attending: Emergency Medicine | Admitting: Emergency Medicine

## 2018-09-28 ENCOUNTER — Emergency Department: Payer: Medicare Other

## 2018-09-28 ENCOUNTER — Other Ambulatory Visit: Payer: Self-pay

## 2018-09-28 DIAGNOSIS — E119 Type 2 diabetes mellitus without complications: Secondary | ICD-10-CM | POA: Insufficient documentation

## 2018-09-28 DIAGNOSIS — Z7984 Long term (current) use of oral hypoglycemic drugs: Secondary | ICD-10-CM | POA: Diagnosis not present

## 2018-09-28 DIAGNOSIS — R1013 Epigastric pain: Secondary | ICD-10-CM | POA: Diagnosis not present

## 2018-09-28 LAB — CBC
HCT: 38.1 % (ref 36.0–46.0)
Hemoglobin: 12.6 g/dL (ref 12.0–15.0)
MCH: 29.4 pg (ref 26.0–34.0)
MCHC: 33.1 g/dL (ref 30.0–36.0)
MCV: 88.8 fL (ref 80.0–100.0)
Platelets: 177 10*3/uL (ref 150–400)
RBC: 4.29 MIL/uL (ref 3.87–5.11)
RDW: 12.2 % (ref 11.5–15.5)
WBC: 5.4 10*3/uL (ref 4.0–10.5)
nRBC: 0 % (ref 0.0–0.2)

## 2018-09-28 LAB — COMPREHENSIVE METABOLIC PANEL
ALBUMIN: 4.1 g/dL (ref 3.5–5.0)
ALK PHOS: 16 U/L — AB (ref 38–126)
ALT: 16 U/L (ref 0–44)
AST: 21 U/L (ref 15–41)
Anion gap: 10 (ref 5–15)
BILIRUBIN TOTAL: 0.6 mg/dL (ref 0.3–1.2)
BUN: 18 mg/dL (ref 8–23)
CALCIUM: 9.1 mg/dL (ref 8.9–10.3)
CO2: 28 mmol/L (ref 22–32)
CREATININE: 0.75 mg/dL (ref 0.44–1.00)
Chloride: 99 mmol/L (ref 98–111)
GFR calc Af Amer: >60 mL/min (ref >60)
GLUCOSE: 170 mg/dL — AB (ref 70–99)
Potassium: 5.2 mmol/L — ABNORMAL HIGH (ref 3.5–5.1)
Sodium: 137 mmol/L (ref 135–145)
TOTAL PROTEIN: 6.5 g/dL (ref 6.5–8.1)

## 2018-09-28 LAB — TROPONIN I: Troponin I: <0.03 ng/mL (ref <0.03)

## 2018-09-28 LAB — LIPASE, BLOOD: Lipase: 37 U/L (ref 11–51)

## 2018-09-28 MED ORDER — FAMOTIDINE 20 MG PO TABS: 20.0000 mg | ORAL_TABLET | Freq: Every day | ORAL | 0 refills | Status: DC

## 2018-09-28 MED ORDER — ACETAMINOPHEN 325 MG PO TABS
650.0000 mg | ORAL_TABLET | Freq: Once | ORAL | Status: AC
  Administered 2018-09-28: 650 mg via ORAL
  Filled 2018-09-28: qty 2

## 2018-09-28 MED ORDER — SODIUM CHLORIDE 0.9 % IV BOLUS
500.0000 mL | Freq: Once | INTRAVENOUS | Status: AC
Start: 2018-09-28 — End: 2018-09-28
  Administered 2018-09-28: 500 mL via INTRAVENOUS

## 2018-09-28 MED ORDER — ALUM & MAG HYDROXIDE-SIMETH 200-200-20 MG/5ML PO SUSP
30.0000 mL | Freq: Once | ORAL | Status: AC
  Administered 2018-09-28: 30 mL via ORAL
  Filled 2018-09-28: qty 30

## 2018-09-28 MED ORDER — IOPAMIDOL (ISOVUE-300) INJECTION 61%
30.0000 mL | Freq: Once | INTRAVENOUS | Status: AC | PRN
  Administered 2018-09-28: 30 mL via ORAL

## 2018-09-28 MED ORDER — IOHEXOL 350 MG/ML SOLN
75.0000 mL | Freq: Once | INTRAVENOUS | Status: AC | PRN
  Administered 2018-09-28: 75 mL via INTRAVENOUS

## 2018-09-28 NOTE — ED Notes (Signed)
Pt is resting in the room. NAD

## 2018-09-28 NOTE — ED Notes (Signed)
Pt alert and oriented X4, active, cooperative, pt in NAD. RR even and unlabored, color WNL.  Pt informed to return if any life threatening symptoms occur.  Discharge and followup instructions reviewed. Ambulates safely. 

## 2018-09-28 NOTE — Discharge Instructions (Signed)
At this time, your work-up is very reassuring.  This is most likely a stomach issue.  If you have severe pain vomiting diarrhea chest pain shortness of breath or you feel worse in any way we asked that you return to the emergency department.  He would prefer not to stay for further cardiac enzymes which is certainly your choice but does give some limitations to our work-up here, as we discussed, and so it is very important that you be vigilant about your health and come back if you feel worse.  Advised taking antiacid's until they are gone and please follow-up with your GI doctor to see if he can get an endoscopy.  Please also see your primary care doctor when they open on Monday.  If you have any bleeding, black stool vomiting increased pain shortness of breath chest pain or fever, or you feel worse in any way we do strongly recommend return

## 2018-09-28 NOTE — ED Provider Notes (Addendum)
[facility] Emergency Department Provider Note  ____________________________________________   I have reviewed the triage vital signs and the nursing notes. Where available I have reviewed prior notes and, if possible and indicated, outside hospital notes.    HISTORY  Chief Complaint Abdominal Pain    HPI Gabriela [name] is a 72 y.o. female with a history of arthritis, diabetes mellitus, that insulin, history of breast cancer remotely, history of chronic pain from lower spinal stenosis apparently presents today with epigastric abdominal pain.  Woke her up last night.  Nonradiating.  Sharp.  Nothing makes it better nothing makes it worse.  Not [employer] food related.  Has not eaten since it started.  Cannot tell me if it started suddenly because she was asleep when it began.  She has had no fever no chills.  No melena no bright red blood per rectum no hematemesis, no vomiting.  She states it has an epigastric discomfort.  She denies any fever or chills.  She has not had any cough or chest pain.  No exertional symptoms no dyspnea no positional symptoms. Pain is very reproducible to her when she changes position or touches her abdomen.  In the epigastric not right upper quadrant area.  Patient still does have her gallbladder.  She denies any other alleviating or aggravating factors.  Not recall having had had pain like this before.  Never had an endoscopy.  Pain is moderate at this time.  It has been constant since it started in the middle of the night.  Pain does not radiate to her back or chest, stays right in her "stomach region".  It is not pleuritic and she has no dyspnea Does not recall what she had for dinner last night.  It is crampy.   Past Medical History:  Diagnosis Date  . Arthritis   . Cancer ([place])    left breast  . Chronic pain    from fall 2012  . Diabetes mellitus without complication ([facility])    diet controlled  . History of closed head injury 2012    accident work threw her 71f  . Hyperlipemia   . Personal history of radiation therapy   . Spinal stenosis   . Urinary incontinence    wears a pessery    Patient Active Problem List   Diagnosis Date Noted  . Peripheral neuropathy 05/16/2018  . Decreased ROM of wrist 07/06/2017  . Closed dislocation of carpometacarpal joint of right wrist 06/15/2017  . Lumbosacral radiculitis 08/15/2016  . Malignant neoplasm of upper-outer quadrant of left breast in female, estrogen receptor positive ([facility] 07/19/2016  . Symptomatic varicose veins of both lower extremities 02/11/2016  . Diabetes mellitus type II, non insulin dependent ([facility] 02/06/2015  . Vitamin D deficiency, unspecified 05/07/2014  . Hyperlipidemia 05/07/2014    Past Surgical History:  Procedure Laterality Date  . ABDOMINAL HYSTERECTOMY    . BREAST LUMPECTOMY  1999   left  . BREAST LUMPECTOMY Left 2016  . BREAST LUMPECTOMY WITH RADIOACTIVE SEED LOCALIZATION Left 03/08/2018   Procedure: LEFT BREAST LUMPECTOMY WITH RADIOACTIVE SEED LOCALIZATION;  Surgeon: HExcell Seltzer MD;  [place] [facility]  Service: General;  Laterality: Left;  . COLONOSCOPY    . COLONOSCOPY WITH PROPOFOL N/A 05/14/2018   Procedure: COLONOSCOPY WITH PROPOFOL;  Surgeon: AJonathon Bellows MD;  [facility];  Service: Gastroenterology;  Laterality: N/A;  . ORIF WRIST FRACTURE Right 06/15/2017   Procedure: OPEN REDUCTION INTERNAL FIXATION (ORIF) WRIST FRACTURE WITH REDUCTION AND  PINNINGT OF CARPAL DISLOCATION;  Surgeon: Milly Jakob, MD;  Location: [place];  Service: Orthopedics;  Laterality: Right;  . RADIOACTIVE SEED GUIDED PARTIAL MASTECTOMY WITH AXILLARY SENTINEL LYMPH NODE BIOPSY Left 02/08/2015   Procedure: RADIOACTIVE SEED LOCALIZATION LUMPECTOMY WITH LEFT AXILLARY SENTINEL LYMPH NODE BIOPSY;  Surgeon: Excell Seltzer, MD;  Location: [place];  Service: General;  Laterality: Left;  . SHOULDER ARTHROSCOPY  2012   left   . TONSILLECTOMY      Prior to Admission medications   Medication Sig Start Date End Date Taking? Authorizing Provider  Blood Glucose Monitoring Suppl (ONE TOUCH ULTRA 2) w/Device KIT USE AS DIRECTED 11/24/16   [provider]  cetirizine (ZYRTEC) 10 MG tablet Take 10 mg by mouth.    [provider]  Cholecalciferol (VITAMIN D) 2000 units tablet Take by mouth.    [provider]  Garlic 5621 MG CAPS Take by mouth.    [provider]  glucose blood (ONE TOUCH ULTRA TEST) test strip Use once daily. Dx code: E11.9 Onr touch Ultra blue 05/16/18   Philemon Kingdom, MD  metFORMIN (GLUCOPHAGE-XR) 500 MG 24 hr tablet TAKE 1 TABLET BY MOUTH TWO  TIMES DAILY 05/10/18   Philemon Kingdom, MD  Multiple Vitamins-Minerals (MULTIVITAMIN WOMEN 50+ PO) Take 1 tablet by mouth daily.    [provider]  Omega-3 1000 MG CAPS Take by mouth.    [provider]  Jonetta Speak LANCETS 30Q MISC USE TO CHECK SUGAR DAILY 07/16/18   Philemon Kingdom, MD  tamoxifen (NOLVADEX) 20 MG tablet Take 1 tablet (20 mg total) by mouth daily. 07/17/18   Magrinat, Virgie Dad, MD  traMADol (ULTRAM) 50 MG tablet Take 1 tablet (50 mg total) by mouth every 6 (six) hours as needed. Patient not taking: Reported on 05/14/2018 03/08/18   Excell Seltzer, MD    Allergies Oxycontin [oxycodone hcl] and Sulfa antibiotics  No family history on file.  Social History Social History   Tobacco Use  . Smoking status: Never Smoker  . Smokeless tobacco: Never Used  Substance Use Topics  . Alcohol use: No  . Drug use: No    Review of Systems Constitutional: No fever/chills Eyes: No visual changes. ENT: No sore throat. No stiff neck no neck pain Cardiovascular: Denies chest pain. Respiratory: Denies shortness of breath. Gastrointestinal:   no vomiting.  No diarrhea.  No constipation. Genitourinary: Negative for dysuria. Musculoskeletal: Negative lower extremity swelling Skin: Negative  for rash. Neurological: Negative for severe headaches, focal weakness or numbness.   ____________________________________________   PHYSICAL EXAM:  VITAL SIGNS: ED Triage Vitals  Enc Vitals Group     BP 09/28/18 0630 139/60     Pulse Rate 09/28/18 0630 75     Resp 09/28/18 0630 18     Temp 09/28/18 0630 97.7 F (36.5 C)     Temp Source 09/28/18 0630 Oral     SpO2 09/28/18 0630 99 %     Weight 09/28/18 0627 176 lb (79.8 kg)     Height 09/28/18 0627 _0  (1.702 m)     Head Circumference --      Peak Flow --      Pain Score 09/28/18 0626 8     Pain Loc --      Pain Edu? --      Excl. in [place]? --     Constitutional: Alert and oriented. Well appearing and in no acute distress. Eyes: Conjunctivae are normal Head: Atraumatic HEENT: No congestion/rhinnorhea.  Mucous membranes are moist.  Oropharynx non-erythematous Neck:   Nontender with no meningismus, no masses, no stridor Cardiovascular: Normal rate, regular rhythm. Grossly normal heart sounds.  Good peripheral circulation. Respiratory: Normal respiratory effort.  No retractions. Lungs CTAB. Abdominal: She has a nonsurgical abdomen with focal tenderness to palpation the epigastric region which reproduces her pain.  Off, I do not palpate a AAA I do not palpate any masses.  Nontender. No distention.  Back:  There is no focal tenderness or step off.  there is no midline tenderness there are no lesions noted. there is no CVA tenderness Musculoskeletal: No lower extremity tenderness, no upper extremity tenderness. No joint effusions, no DVT signs strong distal pulses no edema Neurologic:  Normal speech and language. No gross focal neurologic deficits are appreciated.  Skin:  Skin is warm, dry and intact. No rash noted. Psychiatric: Mood and affect are normal. Speech and behavior are normal.  ____________________________________________   LABS (all labs ordered are listed, but only abnormal results are displayed)  Labs Reviewed   COMPREHENSIVE METABOLIC PANEL - Abnormal; Notable for the following components:      Result Value   Potassium 5.2 (*)    Glucose, Bld 170 (*)    Alkaline Phosphatase 16 (*)    All other components within normal limits  LIPASE, BLOOD  CBC  TROPONIN I    Pertinent labs  results that were available during my care of the patient were reviewed by me and considered in my medical decision making (see chart for details). ____________________________________________  EKG  I personally interpreted any EKGs ordered by me or triage Normal sinus rhythm at 76 bpm no acute ST elevation or depression, borderline right bundle branch block, no acute ischemia, borderline LAD ____________________________________________  RADIOLOGY  Pertinent labs & imaging results that were available during my care of the patient were reviewed by me and considered in my medical decision making (see chart for details). If possible, patient and/or family made aware of any abnormal findings.  No results found. ____________________________________________    PROCEDURES  Procedure(s) performed: None  Procedures  Critical Care performed: None  ____________________________________________   INITIAL IMPRESSION / ASSESSMENT AND PLAN / ED COURSE  Pertinent labs & imaging results that were available during my care of the patient were reviewed by me and considered in my medical decision making (see chart for details).   Patient here with epigastric reproducible abdominal discomfort since last night.  Nonsurgical abdomen.  No evidence of ruptured AAA.  Do not actually palpate a mass or pulsatile mass, given its reproducible nature of low suspicion that this is ACS, EKG is reassuring but I will send a troponin this is been going on for several hours it is my hope that the troponin would, if it is going to be positive, be positive and we check it this morning.  She thinks it started around 2:00 this morning.  Kept her up  at night.  Patient has no radiation to back, no chest pain, nothing at this time to suggest dissection.  She has had no neurologic signs.  She has no melena bright red blood per rectum hematemesis or diarrhea.  Although in early gastroenteritis picture cannot be definitively ruled out at this time, there is no other symptoms of it at this moment.  Patient has no lower abdominal pain or tenderness making appendicitis or diverticular disease less likely, she has no significant focal right upper quadrant abdominal pain.  Sometimes, however, gallbladder disease can present with  epigastric pain.  We will send liver function test, CBC BMP troponin, urinalysis, and I will also check CT scan given the patient's age and atypical discomfort today to ensure that there is no other pathology noted.  ----------------------------------------- 10:13 AM on 09/28/2018 -----------------------------------------  Plan is negative despite patient having over 6 hours pain prior to [place] checking a, EKG is reassuring I do not think this represents ACS, PE, dissection myocarditis endocarditis pneumonia pneumothorax or any other intrathoracic pathology of any significance.  Patient had essentially pain to her stomach which relieved itself completely with a GI cocktail, she tolerated the contrast with no difficulty CT scan of abdomen pelvis is completely normal with no evidence of ischemic gut, pancreatitis mass, perforation gallbladder disease inflammation or other concerns.  Serial abdominal exams in this department are completely benign at this time she has no tenderness to palpation to her abdomen and she wants to go home.  She would prefer not to stay for serial enzymes and I do not think that they are indicated.  We will see how she does with p.o. intake and if she does all right it is my hope that we can get her safely home.  Lab work is reviewed and is reassuring.  Patient and family are eager to be discharged at this time.  They  understand that without staying for further cardiac enzymes there is some limited to her work-up but again I do not think it is significant   ____________________________________________   FINAL CLINICAL IMPRESSION(S) / ED DIAGNOSES  Final diagnoses:  None      This chart was dictated using voice recognition software.  Despite best efforts to proofread,  errors can occur which can change meaning.      Schuyler Amor, MD 09/28/18 0856    Schuyler Amor, MD 09/28/18 1014

## 2018-09-28 NOTE — ED Triage Notes (Signed)
Patient has been awake all night with epigastric pain that goes across the entire abdomen. Denies N/V/D. States she has been really shaky. Patient with history of diabetes as well.

## 2018-10-16 ENCOUNTER — Telehealth: Payer: Self-pay

## 2018-10-16 NOTE — Telephone Encounter (Signed)
Noted. Will address with [facility]

## 2018-10-16 NOTE — Telephone Encounter (Signed)
Please address statin therapy for the patient due to DM per [facility]/quality gap, before the end of 2019 is possible.

## 2018-10-16 NOTE — Telephone Encounter (Signed)
Patient is managed by endocrinology for her diabetes and hyperlipidemia and did not chose to add statin at last visit because patient is at [employer] with numbers.

## 2018-10-18 ENCOUNTER — Ambulatory Visit (INDEPENDENT_AMBULATORY_CARE_PROVIDER_SITE_OTHER): Payer: Medicare Other | Admitting: Family Medicine

## 2018-10-18 ENCOUNTER — Encounter: Payer: Self-pay | Admitting: Family Medicine

## 2018-10-18 VITALS — BP 102/62 | HR 71 | Temp 97.8°F | Ht 67.0 in | Wt 181.8 lb

## 2018-10-18 DIAGNOSIS — G8929 Other chronic pain: Secondary | ICD-10-CM

## 2018-10-18 DIAGNOSIS — E119 Type 2 diabetes mellitus without complications: Secondary | ICD-10-CM | POA: Diagnosis not present

## 2018-10-18 DIAGNOSIS — M25562 Pain in left knee: Secondary | ICD-10-CM | POA: Diagnosis not present

## 2018-10-18 NOTE — Progress Notes (Signed)
Subjective:    Patient ID: Gabriela [name], female    DOB: 1945-12-30, 72 y.o.   MRN: [id number]  HPI This is a 72 yo female who presents today to follow up chronic medical conditions. She has been doing well, good energy, feels well. Continues follow up with oncology for breast cancer.   Her husband has had recent colon cancer diagnosis and will be getting tests for staging and treatment. She is anxious about findings but taking one day at at a time.   DM- well controlled on metformin xr 500 mg, sees endocrine, has follow up in a couple of weeks- will defer labs to them.   Left knee pain, comes and goes. Relief with acetaminophen and activity.  Has seen orthopedics and had x-rays which showed arthritis.  They offered her steroid injection which she declined.  ROS- no chest pain, no SOB, no diarrhea/constipation/weight loss, no LE edema.   Past Medical History:  Diagnosis Date  . Arthritis   . Cancer ([place])    left breast  . Chronic pain    from fall 2012  . Diabetes mellitus without complication (HCC)    diet controlled  . History of closed head injury 2012   accident work threw her 71ft  . Hyperlipemia   . Personal history of radiation therapy   . Spinal stenosis   . Urinary incontinence    wears a pessery   Past Surgical History:  Procedure Laterality Date  . ABDOMINAL HYSTERECTOMY    . BREAST LUMPECTOMY  1999   left  . BREAST LUMPECTOMY Left 2016  . BREAST LUMPECTOMY WITH RADIOACTIVE SEED LOCALIZATION Left 03/08/2018   Procedure: LEFT BREAST LUMPECTOMY WITH RADIOACTIVE SEED LOCALIZATION;  Surgeon: Excell Seltzer, MD;  Location: [place];  Service: General;  Laterality: Left;  . COLONOSCOPY    . COLONOSCOPY WITH PROPOFOL N/A 05/14/2018   Procedure: COLONOSCOPY WITH PROPOFOL;  Surgeon: Jonathon Bellows, MD;  Location: [facility] ENDOSCOPY;  Service: Gastroenterology;  Laterality: N/A;  . ORIF WRIST FRACTURE Right 06/15/2017   Procedure: OPEN REDUCTION INTERNAL  FIXATION (ORIF) WRIST FRACTURE WITH REDUCTION AND PINNINGT OF CARPAL DISLOCATION;  Surgeon: Milly Jakob, MD;  Location: [place];  Service: Orthopedics;  Laterality: Right;  . RADIOACTIVE SEED GUIDED PARTIAL MASTECTOMY WITH AXILLARY SENTINEL LYMPH NODE BIOPSY Left 02/08/2015   Procedure: RADIOACTIVE SEED LOCALIZATION LUMPECTOMY WITH LEFT AXILLARY SENTINEL LYMPH NODE BIOPSY;  Surgeon: Excell Seltzer, MD;  Location: [place];  Service: General;  Laterality: Left;  . SHOULDER ARTHROSCOPY  2012   left  . TONSILLECTOMY     No family history on file. Social History   Tobacco Use  . Smoking status: Never Smoker  . Smokeless tobacco: Never Used  Substance Use Topics  . Alcohol use: No  . Drug use: No      Review of Systems Per HPI    Objective:   Physical Exam Physical Exam  Constitutional: Oriented to person, place, and time. She appears well-developed and well-nourished.  HENT:  Head: Normocephalic and atraumatic.  Eyes: Conjunctivae are normal.  Neck: Normal range of motion. Neck supple.  Cardiovascular: Normal rate, regular rhythm and normal heart sounds.   Pulmonary/Chest: Effort normal and breath sounds normal.  Musculoskeletal: No edema.  Neurological: Alert and oriented to person, place, and time.  Skin: Skin is warm and dry.  Psychiatric: Normal mood and affect. Behavior is normal. Judgment and thought content normal.  Vitals reviewed.     BP 102/62 (BP Location:  Left Arm, Patient Position: Sitting, Cuff Size: Normal)   Pulse 71   Temp 97.8 F (36.6 C)   Ht 5\' 7"  (1.702 m)   Wt 181 lb 12.8 oz (82.5 kg)   SpO2 99%   BMI 28.47 kg/m  Wt Readings from Last 3 Encounters:  10/18/18 181 lb 12.8 oz (82.5 kg)  09/28/18 176 lb (79.8 kg)  05/16/18 177 lb 3.2 oz (80.4 kg)   Diabetic foot exam: Normal inspection No skin breakdown Few small callouses bilaterally, heels with mild dryness  Normal DP pulses Normal sensation to light touch and  monofilament Nails normal     Assessment & Plan:  1. Diabetes mellitus type II, non insulin dependent ([place]) - followed by endocrinology, normal foot exam  2. Chronic pain of left knee - managed with acetaminophen  - follow up for cpe/AWV in 6 months   Clarene Reamer, FNP-BC  [facility], [place]  10/19/2018 9:43 AM

## 2018-11-19 ENCOUNTER — Encounter: Payer: Self-pay | Admitting: Internal Medicine

## 2018-11-19 ENCOUNTER — Ambulatory Visit: Payer: Medicare Other | Admitting: Internal Medicine

## 2018-11-19 VITALS — BP 140/68 | HR 71 | Ht 67.0 in | Wt 181.0 lb

## 2018-11-19 DIAGNOSIS — E785 Hyperlipidemia, unspecified: Secondary | ICD-10-CM

## 2018-11-19 DIAGNOSIS — G629 Polyneuropathy, unspecified: Secondary | ICD-10-CM

## 2018-11-19 DIAGNOSIS — E119 Type 2 diabetes mellitus without complications: Secondary | ICD-10-CM | POA: Diagnosis not present

## 2018-11-19 LAB — POCT GLYCOSYLATED HEMOGLOBIN (HGB A1C): Hemoglobin A1C: 5.9 % — AB (ref 4.0–5.6)

## 2018-11-19 MED ORDER — METFORMIN HCL ER 500 MG PO TB24: 500.0000 mg | ORAL_TABLET | Freq: Every day | ORAL | 3 refills | Status: DC

## 2018-11-19 NOTE — Progress Notes (Signed)
Patient ID: Gabriela [name], female   DOB: 03-Jan-1946, 73 y.o.   MRN: [id number]   HPI: Gabriela [name] is a 73 y.o.-year-old female, returning for f/u for DM2, dx in 04/2014, non-insulin-dependent, controlled, without long-term complications. Last visit 6 months ago.  Husband recently dx'ed with colon cancer, and he had 2 AMIs last Spring.  Later steroid injection 04/2018.  Last hemoglobin A1c was reviewed: Lab Results  Component Value Date   HGBA1C 6.4 04/08/2018   HGBA1C 6 11/16/2017   HGBA1C 5.9 05/16/2017   HGBA1C 5.8 12/27/2016  08/08/2016: HbA1c 7.0% 02/04/2016: HbA1c 6.7% 05/12/2015: HbA1c 6.3% 11/17/2014: HbA1c 6.1% 08/07/2014: HbA1c 6.4%  Pt is on: - Metformin ER 500 mg 2x a day - increased gas if takes it in am She could not tolerate Metformin IR: HA, reflux, borborygmi  Pt checks her sugars once a day: - am: 130s-160, 170 >> n/c  - 2h after b'fast: 95-122, 130 >> 96-120 >> 87-120 - before lunch: n/c  - 2h after lunch: 87-123 >> 98-120 >> 88-114 - before dinner: n/c - 2h after dinner: 93-131 >> 95-135, 201 (steroid inj) >> 90-126 - bedtime: n/c - nighttime: n/c Lowest sugar was 87; it is unclear at which level she has hypoglycemia awareness. Highest sugar was 180 (steroids) >> 201 (steroid inj) >> 126.  Glucometer: One Touch Ultra  Pt's meals are: - Breakfast: multi-grain sandwich with egg salad - Lunch: may skip; multi-grain crackers with salad or homemade pimento cheese - Dinner: grilled or broiled meat and vegetable - Snacks: apple/pear; almonds/cashews  -No CKD, last BUN/creatinine:  Lab Results  Component Value Date   BUN 18 09/28/2018   BUN 16 04/15/2018   CREATININE 0.75 09/28/2018   CREATININE 0.84 04/15/2018   -+ HL;  last set of lipids: Lab Results  Component Value Date   CHOL 169 09/19/2017   HDL 48.40 09/19/2017   LDLCALC 102 (H) 09/19/2017   TRIG 92.0 09/19/2017   CHOLHDL 3 09/19/2017  02/04/2016: 181/84/43/121 She is not on a  statin, but takes omega-3 fatty acids (1200 mg twice a day) also garliq. - last eye exam was 07/2018: No DR - no numbness and tingling in her feet. Off  alpha-lipoic acid and B complex. Started B12.  She has a history of subdural hematoma 6 years ago after an accident when she was working at [employer]. Since then, she has hand tremors. In 2016 she has been dx'ed with BrCA and also had a total hysterectomy and a pelvic repair surgery.   She likes to work in her garden.  ROS: Constitutional: no weight gain/no weight loss, no fatigue, no subjective hyperthermia, no subjective hypothermia Eyes: no blurry vision, no xerophthalmia ENT: no sore throat, no nodules palpated in neck, no dysphagia, no odynophagia, no hoarseness Cardiovascular: no CP/no SOB/no palpitations/no leg swelling Respiratory: no cough/no SOB/no wheezing Gastrointestinal: no N/no V/no D/no C/no acid reflux Musculoskeletal: no muscle aches/no joint aches Skin: no rashes, no hair loss Neurological: no tremors/no numbness/no tingling/no dizziness  I reviewed pt's medications, allergies, PMH, social hx, family hx, and changes were documented in the history of present illness. Otherwise, unchanged from my initial visit note.  Past Medical History:  Diagnosis Date  . Arthritis   . Cancer ([place])    left breast  . Chronic pain    from fall 2012  . Diabetes mellitus without complication (HCC)    diet controlled  . History of closed head injury 2012   accident work threw her  8f  . Hyperlipemia   . Personal history of radiation therapy   . Spinal stenosis   . Urinary incontinence    wears a pessery   Past Surgical History:  Procedure Laterality Date  . ABDOMINAL HYSTERECTOMY    . BREAST LUMPECTOMY  1999   left  . BREAST LUMPECTOMY Left 2016  . BREAST LUMPECTOMY WITH RADIOACTIVE SEED LOCALIZATION Left 03/08/2018   Procedure: LEFT BREAST LUMPECTOMY WITH RADIOACTIVE SEED LOCALIZATION;  Surgeon: Gabriela Seltzer MD;   Location: [name]  Service: General;  Laterality: Left;  . COLONOSCOPY    . COLONOSCOPY WITH PROPOFOL N/A 05/14/2018   Procedure: COLONOSCOPY WITH PROPOFOL;  Surgeon: Gabriela Bellows MD;  Location: [name];  Service: Gastroenterology;  Laterality: N/A;  . ORIF WRIST FRACTURE Right 06/15/2017   Procedure: OPEN REDUCTION INTERNAL FIXATION (ORIF) WRIST FRACTURE WITH REDUCTION AND PINNINGT OF CARPAL DISLOCATION;  Surgeon: Gabriela Jakob MD;  [place]  Service: Orthopedics;  Laterality: Right;  . RADIOACTIVE SEED GUIDED PARTIAL MASTECTOMY WITH AXILLARY SENTINEL LYMPH NODE BIOPSY Left 02/08/2015   Procedure: RADIOACTIVE SEED LOCALIZATION LUMPECTOMY WITH LEFT AXILLARY SENTINEL LYMPH NODE BIOPSY;  Surgeon: BExcell Seltzer MD;  [place]  Service: General;  Laterality: Left;  . SHOULDER ARTHROSCOPY  2012   left  . TONSILLECTOMY     Social History   Social History  . Marital status: Married    Spouse name: N/A  . Number of children: 3   Occupational History  . retired   Social History Main Topics  . Smoking status: Never Smoker  . Smokeless tobacco: Never Used  . Alcohol use No  . Drug use: No   Current Outpatient Medications on File Prior to Visit  Medication Sig Dispense Refill  . Blood Glucose Monitoring Suppl (ONE TOUCH ULTRA 2) w/Device KIT USE AS DIRECTED    . cetirizine (ZYRTEC) 10 MG tablet Take 10 mg by mouth.    . Cholecalciferol (VITAMIN D) 2000 units tablet Take by mouth.    . famotidine (PEPCID) 20 MG tablet Take 1 tablet (20 mg total) by mouth daily. 30 tablet 0  . Garlic 13267MG CAPS Take by mouth.    .Marland Kitchenglucose blood (ONE TOUCH ULTRA TEST) test strip Use once daily. Dx code: E11.9 Onr touch Ultra blue 100 each 3  . metFORMIN (GLUCOPHAGE-XR) 500 MG 24 hr tablet TAKE 1 TABLET BY MOUTH TWO  TIMES DAILY 180 tablet 3  . Multiple Vitamins-Minerals (MULTIVITAMIN WOMEN 50+ PO) Take 1 tablet by mouth daily.    . Omega-3 1000  MG CAPS Take by mouth.    .Glory RosebushDELICA LANCETS 312WMISC USE TO CHECK SUGAR DAILY 100 each 5  . tamoxifen (NOLVADEX) 20 MG tablet Take 1 tablet (20 mg total) by mouth daily. 90 tablet 3  . traMADol (ULTRAM) 50 MG tablet Take 1 tablet (50 mg total) by mouth every 6 (six) hours as needed. (Patient not taking: Reported on 05/14/2018) 10 tablet 1   No current facility-administered medications on file prior to visit.    Allergies  Allergen Reactions  . Oxycontin [Oxycodone Hcl] Shortness Of Breath  . Sulfa Antibiotics Itching   Family history: - DM in sister, MGM.  PE: BP 140/68   Pulse 71   Ht '5\' 7"'  (1.702 m) Comment: measured  Wt 181 lb (82.1 kg)   SpO2 97%   BMI 28.35 kg/m  Wt Readings from Last 3 Encounters:  11/19/18 181 lb (82.1 kg)  10/18/18  181 lb 12.8 oz (82.5 kg)  09/28/18 176 lb (79.8 kg)   Constitutional: overweight, in NAD Eyes: PERRLA, EOMI, no exophthalmos ENT: moist mucous membranes, no thyromegaly, no cervical lymphadenopathy Cardiovascular: RRR, No MRG Respiratory: CTA B Gastrointestinal: abdomen soft, NT, ND, BS+ Musculoskeletal: no deformities, strength intact in all 4 Skin: moist, warm, no rashes Neurological: no tremor with outstretched hands, DTR normal in all 4  ASSESSMENT: 1. DM2, non-insulin-dependent, controlled, without long-term complications  2. HL  3. PN  PLAN:  1. Pt with well-controlled type 2 diabetes, on low-dose metformin ER.  Sugars are well controlled in the past except for the times when she had steroid injections.  She usually has them in the spine, 6 months apart.  Last 04/2018. -We again reviewed her carefully kept log together.  Sugars are all at goal.  Latest HbA1c from last visit was excellent, at 6.4% - at this visit, sugars are even better than at last visit, after she stopped steroid injections (she decided to not get another one) -+ side effects from metformin ER >> gas/bloating if she takes this in am >> will stop the am  dose.  - I suggested to:  Patient Instructions  Please decrease: - Metformin ER 500 mg 1x a day with dinner.  Please return in 6 months with your sugar log  - today, HbA1c is 5.9% (lower) - continue checking sugars at different times of the day - check 1x a day, rotating checks - advised for yearly eye exams >> she is UTD - Return to clinic in 6 mo with sugar log   2. HL - Reviewed latest lipid panel from 09/2017: LDL slightly above target, rest of the fractions at goal Lab Results  Component Value Date   CHOL 169 09/19/2017   HDL 48.40 09/19/2017   LDLCALC 102 (H) 09/19/2017   TRIG 92.0 09/19/2017   CHOLHDL 3 09/19/2017  -Not on a statin but she takes omega-3 fatty acids and garlic.  3. PN -She ran out of alpha-lipoic acid and B complex at last visit and I advised her to restart this - she started B12 injections - she does not have numbness/tingling  Philemon Kingdom, MD PhD [facility] Endocrinology

## 2018-11-19 NOTE — Patient Instructions (Addendum)
Please decrease: - Metformin ER 500 mg 1x a day with dinner.  Please return in 6 months with your sugar log

## 2018-11-19 NOTE — Addendum Note (Signed)
Addended by: Cardell Peach I on: 11/19/2018 10:40 AM   Modules accepted: Orders

## 2018-11-28 ENCOUNTER — Other Ambulatory Visit: Payer: Self-pay | Admitting: *Deleted

## 2018-12-11 DIAGNOSIS — L718 Other rosacea: Secondary | ICD-10-CM | POA: Diagnosis not present

## 2018-12-11 DIAGNOSIS — L218 Other seborrheic dermatitis: Secondary | ICD-10-CM | POA: Diagnosis not present

## 2018-12-24 DIAGNOSIS — L218 Other seborrheic dermatitis: Secondary | ICD-10-CM | POA: Diagnosis not present

## 2018-12-24 DIAGNOSIS — L718 Other rosacea: Secondary | ICD-10-CM | POA: Diagnosis not present

## 2019-03-30 ENCOUNTER — Other Ambulatory Visit: Payer: Self-pay | Admitting: Internal Medicine

## 2019-04-10 ENCOUNTER — Ambulatory Visit: Payer: Medicare Other

## 2019-04-10 ENCOUNTER — Ambulatory Visit (INDEPENDENT_AMBULATORY_CARE_PROVIDER_SITE_OTHER): Payer: Medicare Other

## 2019-04-10 DIAGNOSIS — Z0000 Encounter for general adult medical examination without abnormal findings: Secondary | ICD-10-CM

## 2019-04-10 NOTE — Progress Notes (Signed)
I reviewed health advisor's note, was available for consultation, and agree with documentation and plan.  

## 2019-04-10 NOTE — Progress Notes (Signed)
PCP notes:   Health maintenance:  Microalbumin - PCP follow-up requested/not completed at Dr. Chrissie Noa appt PPSV23 - booster needed per health maintenance  Abnormal screenings:   None  Patient concerns:   Coordination of labs - communication sent to healthcare team  Nurse concerns:  None  Next PCP appt:   04/21/19 @ 0930

## 2019-04-10 NOTE — Progress Notes (Signed)
Subjective:   Gabriela [name] is a 73 y.o. female who presents for Medicare Annual (Subsequent) preventive examination.  Review of Systems:  N/A Cardiac Risk Factors include: advanced age (>65mn, >>42women);diabetes mellitus     Objective:     Vitals: There were no vitals taken for this visit.  There is no height or weight on file to calculate BMI.  Advanced Directives 04/10/2019 09/28/2018 05/14/2018 04/08/2018 03/08/2018 03/01/2018 06/15/2017  Does Patient Have a Medical Advance Directive? Yes No Yes Yes Yes Yes No  Type of [place]amed[place] - Living will;Healthcare Power of Attorney Living will Living will [place]-  Does patient want to make changes to medical advance directive? - - - - No - Patient declined No - Patient declined -  Copy of HFrontierin Chart? No - copy requested - - - - - -  Would patient like information on creating a medical advance directive? - No - Patient declined - - No - Patient declined - No - Patient declined    Tobacco Social History   Tobacco Use  Smoking Status Never Smoker  Smokeless Tobacco Never Used     Counseling given: No   Clinical Intake:  Pre-visit preparation completed: Yes  Pain : No/denies pain Pain Score: 0-No pain     Nutritional Status: BMI 25 -29 Overweight Nutritional Risks: None Diabetes: Yes CBG done?: No Did pt. bring in CBG monitor from home?: No  How often do you need to have someone help you when you read instructions, pamphlets, or other written materials from your doctor or pharmacy?: 1 - Never What is the last grade level you completed in school?: 12th grade + 2 yrs college  Interpreter Needed?: No  Comments: pt lives with spouse Information entered by :: LPinson, LPN  Past Medical History:  Diagnosis Date  . Arthritis   . [place]    left breast  . Chronic pain    from fall 2012  . Diabetes mellitus without  complication (HCC)    diet controlled  . History of closed head injury 2012   accident work threw her 89f . Hyperlipemia   . Personal history of radiation therapy   . Spinal stenosis   . Urinary incontinence    wears a pessery   Past Surgical History:  Procedure Laterality Date  . ABDOMINAL HYSTERECTOMY    . BREAST LUMPECTOMY  1999   left  . BREAST LUMPECTOMY Left 2016  . BREAST LUMPECTOMY WITH RADIOACTIVE SEED LOCALIZATION Left 03/08/2018   Procedure: LEFT BREAST LUMPECTOMY WITH RADIOACTIVE SEED LOCALIZATION;  Surgeon: HoExcell SeltzerMD;  [place] Service: General;  Laterality: Left;  . COLONOSCOPY    . COLONOSCOPY WITH PROPOFOL N/A 05/14/2018   Procedure: COLONOSCOPY WITH PROPOFOL;  Surgeon: AnJonathon BellowsMD;  [facility];  Service: Gastroenterology;  Laterality: N/A;  . ORIF WRIST FRACTURE Right 06/15/2017   Procedure: OPEN REDUCTION INTERNAL FIXATION (ORIF) WRIST FRACTURE WITH REDUCTION AND PINNINGT OF CARPAL DISLOCATION;  Surgeon: ThMilly JakobMD;  [place] [facility] Service: Orthopedics;  Laterality: Right;  . RADIOACTIVE SEED GUIDED PARTIAL MASTECTOMY WITH AXILLARY SENTINEL LYMPH NODE BIOPSY Left 02/08/2015   Procedure: RADIOACTIVE SEED LOCALIZATION LUMPECTOMY WITH LEFT AXILLARY SENTINEL LYMPH NODE BIOPSY;  Surgeon: BeExcell SeltzerMD;  [facility] Service: General;  Laterality: Left;  . SHOULDER ARTHROSCOPY  2012   left  . TONSILLECTOMY  History reviewed. No pertinent family history. Social History   Socioeconomic History  . Marital status: Married    Spouse name: Not on file  . Number of children: Not on file  . Years of education: Not on file  . Highest education level: Not on file  Occupational History  . Not on file  Social Needs  . Financial resource strain: Not on file  . Food insecurity:    Worry: Not on file    Inability: Not on file  . Transportation needs:    Medical: Not on  file    Non-medical: Not on file  Tobacco Use  . Smoking status: Never Smoker  . Smokeless tobacco: Never Used  Substance and Sexual Activity  . Alcohol use: No  . Drug use: No  . Sexual activity: Not Currently  Lifestyle  . Physical activity:    Days per week: Not on file    Minutes per session: Not on file  . Stress: Not on file  Relationships  . Social connections:    Talks on phone: Not on file    Gets together: Not on file    Attends religious service: Not on file    Active member of club or organization: Not on file    Attends meetings of clubs or organizations: Not on file    Relationship status: Not on file  Other Topics Concern  . Not on file  Social History Narrative   The patient and her husband Dominica Severin are both retired. They live on 12 acres in our quite busy caring for that. At is also Clementina's chief source of exercise. Daughter Jamesetta So is an occupational Programmer, applications in [place]. Daughter Rolly Salter is a Cabin crew in [place]. Son Makayela Secrest works as a Librarian, academic for the twin [employer] [facility] in [place]. The patient has 4 grandchildren. She is a Psychologist, forensic.    Outpatient Encounter Medications as of 04/10/2019  Medication Sig  . Blood Glucose Monitoring Suppl (ONE TOUCH ULTRA 2) w/Device KIT USE AS DIRECTED  . cetirizine (ZYRTEC) 10 MG tablet Take 10 mg by mouth.  . Cholecalciferol (VITAMIN D) 2000 units tablet Take by mouth.  . Garlic 0277 MG CAPS Take by mouth.  . metFORMIN (GLUCOPHAGE-XR) 500 MG 24 hr tablet TAKE 1 TABLET BY MOUTH TWO  TIMES DAILY  . Multiple Vitamins-Minerals (MULTIVITAMIN WOMEN 50+ PO) Take 1 tablet by mouth daily.  . Omega-3 1000 MG CAPS Take by mouth.  Glory Rosebush DELICA LANCETS 41O MISC USE TO CHECK SUGAR DAILY  . ONETOUCH ULTRA test strip USE ONCE DAILY.  . tamoxifen (NOLVADEX) 20 MG tablet Take 1 tablet (20 mg total) by mouth daily.  . [DISCONTINUED] famotidine (PEPCID) 20 MG tablet Take 1 tablet (20 mg total) by mouth daily.  (Patient not taking: Reported on 04/10/2019)  . [DISCONTINUED] traMADol (ULTRAM) 50 MG tablet Take 1 tablet (50 mg total) by mouth every 6 (six) hours as needed. (Patient not taking: Reported on 04/10/2019)   No facility-administered encounter medications on file as of 04/10/2019.     Activities of Daily Living In your present state of health, do you have any difficulty performing the following activities: 04/10/2019  Hearing? N  Vision? N  Difficulty concentrating or making decisions? N  Walking or climbing stairs? N  Dressing or bathing? N  Doing errands, shopping? N  Preparing Food and eating ? N  Using the Toilet? N  In the past six months, have you accidently leaked urine? N  Do  you have problems with loss of bowel control? N  Managing your Medications? N  Managing your Finances? N  Housekeeping or managing your Housekeeping? N  Some recent data might be hidden    Patient Care Team: [name], FNP as PCP - General (Nurse Practitioner) Excell Seltzer, MD as Consulting Physician (General Surgery) Philemon Kingdom, MD as Consulting Physician (Internal Medicine)    Assessment:   This is a routine wellness examination for [place].  Vision Screening Comments: Vision exam in Summer 2019 with Dr. Sandra Cockayne  Exercise Activities and Dietary recommendations Current Exercise Habits: The patient does not participate in regular exercise at present(gardening), Exercise limited by: None identified  Goals    . Patient Stated     Starting 04/10/19, I will continue to take medications as prescribed.        Fall Risk Fall Risk  04/10/2019 04/17/2018 04/08/2018 12/03/2015  Falls in the past year? 0 No No No   Depression Screen PHQ 2/9 Scores 04/10/2019 04/17/2018 04/08/2018 12/03/2015  PHQ - 2 Score 0 0 0 0  PHQ- 9 Score 0 - 0 -     Cognitive Function MMSE - Mini Mental State Exam 04/10/2019 04/08/2018  Orientation to time 5 5  Orientation to Place 5 5  Registration 3 3  Attention/  Calculation 0 0  Recall 3 2  Recall-comments - unable to recall 1 of 3 words  Language- name 2 objects 0 0  Language- repeat 1 1  Language- follow 3 step command 0 3  Language- read & follow direction 0 0  Write a sentence 0 0  Copy design 0 0  Total score 17 19       PLEASE NOTE: A Mini-Cog screen was completed. Maximum score is 17. A value of 0 denotes this part of [place] MMSE was not completed or the patient failed this part of the Mini-Cog screening.   Mini-Cog Screening Orientation to Time - Max 5 pts Orientation to Place - Max 5 pts Registration - Max 3 pts Recall - Max 3 pts Language Repeat - Max 1 pts    Immunization History  Administered Date(s) Administered  . Influenza, High Dose Seasonal PF 08/16/2017, 07/26/2018  . Influenza-Unspecified 08/05/2014, 08/06/2016, 08/15/2017  . Pneumococcal Conjugate-13 04/17/2018  . Pneumococcal Polysaccharide-23 02/05/2008  . Td 11/06/2006  . Tdap 06/15/2017    Screening Tests Health Maintenance  Topic Date Due  . URINE MICROALBUMIN  04/21/2019 (Originally 04/09/2019)  . PNA vac Low Risk Adult (2 of 2 - PPSV23) 04/18/2019  . HEMOGLOBIN A1C  05/20/2019  . INFLUENZA VACCINE  06/07/2019  . OPHTHALMOLOGY EXAM  09/06/2019  . FOOT EXAM  10/19/2019  . MAMMOGRAM  02/08/2020  . TETANUS/TDAP  06/16/2027  . COLONOSCOPY  05/14/2028  . DEXA SCAN  Completed  . Hepatitis C Screening  Completed       Plan:     I have personally reviewed, addressed, and noted the following in the patient's chart:  A. Medical and social history B. Use of alcohol, tobacco or illicit drugs  C. Current medications and supplements D. Functional ability and status E.  Nutritional status F.  Physical activity G. Advance directives H. List of other physicians I.  Hospitalizations, surgeries, and ER visits in previous 12 months J.  Vitals (unless it is a telemedicine encounter) K. Screenings to include cognitive, depression, hearing, vision (NOTE:  hearing and vision screenings not completed in telemedicine encounter) L. Referrals and appointments   In addition, I have reviewed  and discussed with patient certain preventive protocols, quality metrics, and best practice recommendations. A written personalized care plan for preventive services and recommendations were provided to patient.  With patient's permission, we connected on 04/10/19 at 10:30 AM EDT. Interactive audio and video telecommunications were attempted with patient. This attempt was unsuccessful due to patient having technical difficulties OR patient did not have access to video capability.  Encounter was completed with audio only.  Two patient identifiers were used to ensure the encounter occurred with the correct person. Patient was in home and writer was in office.   Signed,   Lindell Noe, MHA, BS, LPN Health Coach

## 2019-04-10 NOTE — Patient Instructions (Signed)
Gabriela [name] , Thank you for taking time to come for your Medicare Wellness Visit. I appreciate your ongoing commitment to your health goals. Please review the following plan we discussed and let me know if I can assist you in the future.   These are the goals we discussed: Goals    . Patient Stated     Starting 04/10/19, I will continue to take medications as prescribed.        This is a list of the screening recommended for you and due dates:  Health Maintenance  Topic Date Due  . Urine Protein Check  04/09/2019  . Pneumonia vaccines (2 of 2 - PPSV23) 04/18/2019  . Hemoglobin A1C  05/20/2019  . Flu Shot  06/07/2019  . Eye exam for diabetics  09/06/2019  . Complete foot exam   10/19/2019  . Mammogram  02/08/2020  . Tetanus Vaccine  06/16/2027  . Colon Cancer Screening  05/14/2028  . DEXA scan (bone density measurement)  Completed  .  Hepatitis C: One time screening is recommended by Center for Disease Control  (CDC) for  adults born from [birth year] through 1964.   Completed   Preventive Care for Adults  A healthy lifestyle and preventive care can promote health and wellness. Preventive health guidelines for adults include the following key practices.  . A routine yearly physical is a good way to check with your health care provider about your health and preventive screening. It is a chance to share any concerns and updates on your health and to receive a thorough exam.  . Visit your dentist for a routine exam and preventive care every 6 months. Brush your teeth twice a day and floss once a day. Good oral hygiene prevents tooth decay and gum disease.  . The frequency of eye exams is based on your age, health, family medical history, use  of contact lenses, and other factors. Follow your health care provider's recommendations for frequency of eye exams.  . Eat a healthy diet. Foods like vegetables, fruits, whole grains, low-fat dairy products, and lean protein foods contain the nutrients  you need without too many calories. Decrease your intake of foods high in solid fats, added sugars, and salt. Eat the right amount of calories for you. Get information about a proper diet from your health care provider, if necessary.  . Regular physical exercise is one of the most important things you can do for your health. Most adults should get at least 150 minutes of moderate-intensity exercise (any activity that increases your heart rate and causes you to sweat) each week. In addition, most adults need muscle-strengthening exercises on 2 or more days a week.  Silver Sneakers may be a benefit available to you. To determine eligibility, you may visit the website: www.silversneakers.com or contact program at (228) 688-6855 Mon-Fri between 8AM-8PM.   . Maintain a healthy weight. The body mass index (BMI) is a screening tool to identify possible weight problems. It provides an estimate of body fat based on height and weight. Your health care provider can find your BMI and can help you achieve or maintain a healthy weight.   For adults 20 years and older: ? A BMI below 18.5 is considered underweight. ? A BMI of 18.5 to 24.9 is normal. ? A BMI of 25 to 29.9 is considered overweight. ? A BMI of 30 and above is considered obese.   . Maintain normal blood lipids and cholesterol levels by exercising and minimizing your intake of saturated  fat. Eat a balanced diet with plenty of fruit and vegetables. Blood tests for lipids and cholesterol should begin at age 15 and be repeated every 5 years. If your lipid or cholesterol levels are high, you are over 50, or you are at high risk for heart disease, you may need your cholesterol levels checked more frequently. Ongoing high lipid and cholesterol levels should be treated with medicines if diet and exercise are not working.  . If you smoke, find out from your health care provider how to quit. If you do not use tobacco, please do not start.  . If you choose to  drink alcohol, please do not consume more than 2 drinks per day. One drink is considered to be 12 ounces (355 mL) of beer, 5 ounces (148 mL) of wine, or 1.5 ounces (44 mL) of liquor.  . If you are 31-5 years old, ask your health care provider if you should take aspirin to prevent strokes.  . Use sunscreen. Apply sunscreen liberally and repeatedly throughout the day. You should seek shade when your shadow is shorter than you. Protect yourself by wearing long sleeves, pants, a wide-brimmed hat, and sunglasses year round, whenever you are outdoors.  . Once a month, do a whole body skin exam, using a mirror to look at the skin on your back. Tell your health care provider of new moles, moles that have irregular borders, moles that are larger than a pencil eraser, or moles that have changed in shape or color.

## 2019-04-14 ENCOUNTER — Encounter: Payer: Self-pay | Admitting: Oncology

## 2019-04-14 ENCOUNTER — Other Ambulatory Visit: Payer: Self-pay

## 2019-04-14 ENCOUNTER — Inpatient Hospital Stay: Payer: Medicare Other | Attending: Oncology

## 2019-04-14 ENCOUNTER — Inpatient Hospital Stay: Payer: Medicare Other | Admitting: Oncology

## 2019-04-14 VITALS — BP 135/59 | HR 68 | Temp 98.7°F | Resp 17 | Ht 67.0 in | Wt 174.5 lb

## 2019-04-14 DIAGNOSIS — C50412 Malignant neoplasm of upper-outer quadrant of left female breast: Secondary | ICD-10-CM | POA: Insufficient documentation

## 2019-04-14 DIAGNOSIS — Z170 Estrogen receptor positive status [ER+]: Secondary | ICD-10-CM | POA: Diagnosis not present

## 2019-04-14 LAB — CBC WITH DIFFERENTIAL/PLATELET
Abs Immature Granulocytes: 0.01 10*3/uL (ref 0.00–0.07)
Basophils Absolute: 0 10*3/uL (ref 0.0–0.1)
Basophils Relative: 1 %
Eosinophils Absolute: 0.1 10*3/uL (ref 0.0–0.5)
Eosinophils Relative: 2 %
HCT: 36.9 % (ref 36.0–46.0)
Hemoglobin: 11.8 g/dL — ABNORMAL LOW (ref 12.0–15.0)
Immature Granulocytes: 0 %
Lymphocytes Relative: 22 %
Lymphs Abs: 0.9 10*3/uL (ref 0.7–4.0)
MCH: 29.4 pg (ref 26.0–34.0)
MCHC: 32 g/dL (ref 30.0–36.0)
MCV: 91.8 fL (ref 80.0–100.0)
Monocytes Absolute: 0.4 10*3/uL (ref 0.1–1.0)
Monocytes Relative: 9 %
Neutro Abs: 2.9 10*3/uL (ref 1.7–7.7)
Neutrophils Relative %: 66 %
Platelets: 175 10*3/uL (ref 150–400)
RBC: 4.02 MIL/uL (ref 3.87–5.11)
RDW: 12.5 % (ref 11.5–15.5)
WBC: 4.4 10*3/uL (ref 4.0–10.5)
nRBC: 0 % (ref 0.0–0.2)

## 2019-04-14 LAB — COMPREHENSIVE METABOLIC PANEL
ALT: 15 U/L (ref 0–44)
AST: 21 U/L (ref 15–41)
Albumin: 3.8 g/dL (ref 3.5–5.0)
Alkaline Phosphatase: 20 U/L — ABNORMAL LOW (ref 38–126)
Anion gap: 8 (ref 5–15)
BUN: 14 mg/dL (ref 8–23)
CO2: 29 mmol/L (ref 22–32)
Calcium: 9.3 mg/dL (ref 8.9–10.3)
Chloride: 103 mmol/L (ref 98–111)
Creatinine, Ser: 0.83 mg/dL (ref 0.44–1.00)
GFR calc Af Amer: >60 mL/min (ref >60)
GFR calc non Af Amer: >60 mL/min (ref >60)
Glucose, Bld: 125 mg/dL — ABNORMAL HIGH (ref 70–99)
Potassium: 5 mmol/L (ref 3.5–5.1)
Sodium: 140 mmol/L (ref 135–145)
Total Bilirubin: 0.3 mg/dL (ref 0.3–1.2)
Total Protein: 6.3 g/dL — ABNORMAL LOW (ref 6.5–8.1)

## 2019-04-14 MED ORDER — TAMOXIFEN CITRATE 20 MG PO TABS: 20.0000 mg | ORAL_TABLET | Freq: Every day | ORAL | 4 refills | Status: DC

## 2019-04-14 NOTE — Progress Notes (Signed)
[facility]  [phone] [phone]    ID: Gabriela [name] DOB: 1946-05-16  MR#: [id number]  DJM#:[id number]  Patient Care Team: [name], FNP as PCP - General (Nurse Practitioner) Excell Seltzer, MD as Consulting Physician (General Surgery) Philemon Kingdom, MD as Consulting Physician (Internal Medicine) Jonathon Bellows, MD as Consulting Physician (Gastroenterology) Magrinat, Virgie Dad, MD as Consulting Physician (Oncology) OTHER MD: Prentiss Bells "[name]" Spencer, [place]; Berton Mount MD, Dossie Arbour MD   CHIEF COMPLAINT: Estrogen receptor positive breast cancer  CURRENT TREATMENT: Tamoxifen   INTERVAL HISTORY: Gabriela [name] returns today for follow-up of her estrogen receptor positive breast cancer.   She continues on tamoxifen, with good tolerance. She denies having hot flashes or increased vaginal discharge.  Since her last visit, she underwent bone density testing on 05/29/2018. This showed a T-score of 1.5.  She also underwent colonoscopy on 05/14/2018. Colon polyp biopsy ([id number]) was negative for dysplasia.  She also underwent abdomen/pelvis CT on 09/28/2018 for abdominal pain, which showed no acute findings. The cystic lesion of the left adrenal gland is consistent with either a benign cyst or adenoma.  She was due for repeat mammography in April, though this was delayed due to the virus.   REVIEW OF SYSTEMS: Gabriela [name] reports some sensitivity to her left breast that has been present since she underwent radiation therapy. She has been focused on her husband the last 6 months. She states her husband was diagnosed with colon cancer in 10/2018. He underwent colectomy and is undergoing chemotherapy. She has been his caretaker during this.  The patient denies unusual headaches, visual changes, nausea, vomiting, stiff neck, dizziness, or gait imbalance. There has been no cough, phlegm production, or pleurisy, no chest pain or pressure,  and no change in bowel or bladder habits. The patient denies fever, rash, bleeding, unexplained fatigue or unexplained weight loss. A detailed review of systems was otherwise entirely negative.   BREAST CANCER HISTORY: From the original intake note:  The patient herself noted a lump in her left breast, associated with some discomfort. She brought it to her physician's attention and on 01/15/2015 underwent left digital mammography with tomography and ultrasonography at [facility]. This showed breast density to be category A. Compression images were negative but on physical exam there was a firm palpable mobile nodule at the 3:00 position of the left breast. By ultrasound this measured 9 mm maximally. There is no note regarding axillary adenopathy in the mammographic report.  On 01/18/2015 the patient underwent biopsy of the mass in question and this showed ([id number]) an invasive mammary carcinoma, measuring 3 mm on the specimen, grade 1, estrogen receptor greater than 90% positive, progesterone receptor between 11 and 50% positive, with no HER-2 amplification  (Immunohistochemistry score 1+).  On 02/03/2015 the patient underwent bilateral breast MRI. This suggested a breast density category B. The right breast was negative and there were no abnormal appearing lymph nodes. In the left breast there was an irregular enhancing mass subareolar early measuring 9 mm.  Her subsequent history is as detailed below   PAST MEDICAL HISTORY: Past Medical History:  Diagnosis Date  . Arthritis   . Cancer ([place])    left breast  . Chronic pain    from fall 2012  . Diabetes mellitus without complication (HCC)    diet controlled  . History of closed head injury 2012   accident work threw her 64f  . Hyperlipemia   . Personal history of radiation therapy   .  Spinal stenosis   . Urinary incontinence    wears a pessery    PAST SURGICAL HISTORY: Past Surgical History:  Procedure  Laterality Date  . ABDOMINAL HYSTERECTOMY    . BREAST LUMPECTOMY  1999   left  . BREAST LUMPECTOMY Left 2016  . BREAST LUMPECTOMY WITH RADIOACTIVE SEED LOCALIZATION Left 03/08/2018   Procedure: LEFT BREAST LUMPECTOMY WITH RADIOACTIVE SEED LOCALIZATION;  Surgeon: Excell Seltzer, MD;  Location: [facility];  Service: General;  Laterality: Left;  . COLONOSCOPY    . COLONOSCOPY WITH PROPOFOL N/A 05/14/2018   Procedure: COLONOSCOPY WITH PROPOFOL;  Surgeon: Jonathon Bellows, MD;  Location: [facility];  Service: Gastroenterology;  Laterality: N/A;  . ORIF WRIST FRACTURE Right 06/15/2017   Procedure: OPEN REDUCTION INTERNAL FIXATION (ORIF) WRIST FRACTURE WITH REDUCTION AND PINNINGT OF CARPAL DISLOCATION;  Surgeon: Milly Jakob, MD;  Location: [place];  Service: Orthopedics;  Laterality: Right;  . RADIOACTIVE SEED GUIDED PARTIAL MASTECTOMY WITH AXILLARY SENTINEL LYMPH NODE BIOPSY Left 02/08/2015   Procedure: RADIOACTIVE SEED LOCALIZATION LUMPECTOMY WITH LEFT AXILLARY SENTINEL LYMPH NODE BIOPSY;  Surgeon: Excell Seltzer, MD;  Location: [facility];  Service: General;  Laterality: Left;  . SHOULDER ARTHROSCOPY  2012   left  . TONSILLECTOMY      FAMILY HISTORY Family History  Problem Relation Age of Onset  . Colon cancer Mother        possibly caused by medication during pregnancy  . Breast cancer Maternal Aunt        dx in 3s  . Cervical cancer Maternal Aunt        dx in 53s   The patient's father died in an automobile accident at age 73. The patient's mother died at the age of 38 from colon cancer. The patient has been told that her mother was on a medication during her pregnancy that may have caused the colon cancer. The patient's mother had a sister diagnosed with breast and cervical cancer in her 74s. She also had a brother who was killed in World War II. There is no other history of cancer in the family to the patient's knowledge    GYNECOLOGIC HISTORY:  No LMP  recorded. Patient has had a hysterectomy. Menarche age 59, first live birth age 30. The patient is GX P3. She went through the change of life approximately age 39. She did not take hormone replacement. She never used birth control    SOCIAL HISTORY:  The patient and her husband Dominica Severin are both retired. They live on [address] acres in our quite busy caring for that. At is also Gabriela [name]'s chief source of exercise. Daughter Jamesetta So is an occupational Programmer, applications in [place]. Daughter Rolly Salter is a Cabin crew in [place]. Son Salvatrice Morandi works as a Librarian, academic for the [employer] in [place]. The patient has 4 grandchildren. She is a Psychologist, forensic.    ADVANCED DIRECTIVES: In place   HEALTH MAINTENANCE: Social History   Tobacco Use  . Smoking status: Never Smoker  . Smokeless tobacco: Never Used  Substance Use Topics  . Alcohol use: No  . Drug use: No     Colonoscopy: 05/14/2018, negative for dysplasia  PAP: 2015  Bone density: 05/29/2018, 1.5  Lipid panel:  Allergies  Allergen Reactions  . Oxycontin [Oxycodone Hcl] Shortness Of Breath  . Sulfa Antibiotics Itching   Current Outpatient Medications  Medication Sig Dispense Refill  . Blood Glucose Monitoring Suppl (ONE TOUCH ULTRA 2) w/Device KIT USE AS DIRECTED    .  cetirizine (ZYRTEC) 10 MG tablet Take 10 mg by mouth.    . Cholecalciferol (VITAMIN D) 2000 units tablet Take by mouth.    . Garlic 2248 MG CAPS Take by mouth.    . metFORMIN (GLUCOPHAGE-XR) 500 MG 24 hr tablet TAKE 1 TABLET BY MOUTH TWO  TIMES DAILY 180 tablet 3  . Multiple Vitamins-Minerals (MULTIVITAMIN WOMEN 50+ PO) Take 1 tablet by mouth daily.    . Omega-3 1000 MG CAPS Take by mouth.    Gabriela [name] DELICA LANCETS 25O MISC USE TO CHECK SUGAR DAILY 100 each 5  . ONETOUCH ULTRA test strip USE ONCE DAILY. 100 each 11  . tamoxifen (NOLVADEX) 20 MG tablet Take 1 tablet (20 mg total) by mouth daily. 90 tablet 4   No current facility-administered medications for this  visit.     OBJECTIVE: Middle-aged white woman in no acute distress  Vitals:   04/14/19 1109  BP: (!) 135/59  Pulse: 68  Resp: 17  Temp: 98.7 F (37.1 C)  TempSrc: Oral  SpO2: 100%  Weight: 174 lb 8 oz (79.2 kg)  Height: '5\' 7"'  (1.702 m)   Sclerae unicteric, pupils round and equal No cervical or supraclavicular adenopathy Lungs no rales or rhonchi Heart regular rate and rhythm Abd soft, nontender, positive bowel sounds MSK no focal spinal tenderness, no upper extremity lymphedema Neuro: nonfocal, well oriented, appropriate affect Breasts: The right breast is benign.  The left breast has undergone lumpectomy followed by radiation.  There is no evidence of disease recurrence.  Both axillae are benign.  LAB RESULTS:  CMP     Component Value Date/Time   NA 138 02/20/2017 1149   K 4.7 02/20/2017 1149   CO2 27 02/20/2017 1149   GLUCOSE 192 (H) 02/20/2017 1149   BUN 14.7 02/20/2017 1149   CREATININE 0.9 02/20/2017 1149   CALCIUM 9.5 02/20/2017 1149   PROT 6.6 02/20/2017 1149   ALBUMIN 3.9 02/20/2017 1149   AST 16 02/20/2017 1149   ALT 12 02/20/2017 1149   ALKPHOS 19 (L) 02/20/2017 1149   BILITOT 0.60 02/20/2017 1149    INo results found for: SPEP, UPEP  Lab Results  Component Value Date   WBC 5.5 02/20/2017   NEUTROABS 4.2 02/20/2017   HGB 12.4 02/20/2017   HCT 36.9 02/20/2017   MCV 89.1 02/20/2017   PLT 199 02/20/2017      Chemistry      Component Value Date/Time   NA 138 02/20/2017 1149   K 4.7 02/20/2017 1149   CO2 27 02/20/2017 1149   BUN 14.7 02/20/2017 1149   CREATININE 0.9 02/20/2017 1149      Component Value Date/Time   CALCIUM 9.5 02/20/2017 1149   ALKPHOS 19 (L) 02/20/2017 1149   AST 16 02/20/2017 1149   ALT 12 02/20/2017 1149   BILITOT 0.60 02/20/2017 1149       No results found for: LABCA2  No components found for: LABCA125  No results for input(s): INR in the last 168 hours.  Urinalysis No results found for: COLORURINE,  APPEARANCEUR, LABSPEC, PHURINE, GLUCOSEU, HGBUR, BILIRUBINUR, KETONESUR, PROTEINUR, UROBILINOGEN, NITRITE, LEUKOCYTESUR  STUDIES: No results found.   ASSESSMENT: 73 y.o. [place] woman status post left breast upper outer quadrant biopsy 01/18/2015 for a clinical T1b N0, stage IA invasive ductal carcinoma, grade 1, estrogen and progesterone receptor positive, HER-2 not amplified  (1) left lumpectomy and sentinel lymph node sampling 02/08/2015 showed a pT1b pN0, stage IA invasive ductal carcinoma, grade 1, with repeat HER-2  again negative. Margins were negative but close  (2) Oncotype DX showed a score of 14, predicting a risk of recurrence outside the breast in the next 10 years of 9% if the patient's only systemic treatment is tamoxifen for 5 years. It also protects no benefit from adjuvant chemotherapy.  (3) adjuvant radiation completed in [facility] 05/11/2015  (4) tamoxifen started 05/20/2015  (a) bone density 05/29/2018 normal with a T score of 1.5  (5) TAH-BSO in December 2016. Benign pathology.  (6) left breast upper outer quadrant biopsy 03/08/2018 with no evidence of malignancy  PLAN: Gabriela [name] is now a little over 4 years out from definitive surgery for her breast cancer with no evidence of disease recurrence.  This is very favorable.  She is tolerating tamoxifen very well and the plan at this point is to continue it for a total of 5 years.  She is aware that some people choose to continue tamoxifen for a total of 10 years.  This does provide another 2 to 3% decrease in the risk of recurrence.  It also can be helpful in terms of bone density issues.  She will have that option if she wishes.  Otherwise she will "graduate" at the next visit  She is a little bit behind on her mammography and I have put the order in so it can be done this month.  She knows to call for any other issue that may develop before her return here.     Magrinat, Virgie Dad, MD  04/14/19 11:36 AM Medical  Oncology and Hematology [facility] [address] [place], [place] [address] Tel. [phone]    Fax. [phone]   I, Wilburn Mylar, am acting as scribe for Dr. Virgie Dad. Magrinat.  I, Lurline Del MD, have reviewed the above documentation for accuracy and completeness, and I agree with the above.

## 2019-04-15 ENCOUNTER — Telehealth: Payer: Self-pay | Admitting: Oncology

## 2019-04-15 NOTE — Telephone Encounter (Signed)
Talk with patient regarding schedule °

## 2019-04-21 ENCOUNTER — Encounter: Payer: Medicare Other | Admitting: Family Medicine

## 2019-04-28 ENCOUNTER — Ambulatory Visit
Admission: RE | Admit: 2019-04-28 | Discharge: 2019-04-28 | Disposition: A | Discharge status: Home / Self Care | Payer: Medicare Other | Source: Ambulatory Visit | Attending: Oncology | Admitting: Oncology

## 2019-04-28 ENCOUNTER — Other Ambulatory Visit: Payer: Self-pay

## 2019-04-28 DIAGNOSIS — R928 Other abnormal and inconclusive findings on diagnostic imaging of breast: Secondary | ICD-10-CM | POA: Diagnosis not present

## 2019-04-28 DIAGNOSIS — Z853 Personal history of malignant neoplasm of breast: Secondary | ICD-10-CM | POA: Diagnosis not present

## 2019-04-28 DIAGNOSIS — C50412 Malignant neoplasm of upper-outer quadrant of left female breast: Secondary | ICD-10-CM

## 2019-05-16 ENCOUNTER — Other Ambulatory Visit: Payer: Self-pay

## 2019-05-16 ENCOUNTER — Encounter: Payer: Self-pay | Admitting: Family Medicine

## 2019-05-16 ENCOUNTER — Ambulatory Visit (INDEPENDENT_AMBULATORY_CARE_PROVIDER_SITE_OTHER): Payer: Medicare Other | Admitting: Family Medicine

## 2019-05-16 VITALS — BP 100/62 | HR 50 | Temp 98.5°F | Resp 16 | Ht 66.75 in | Wt 173.8 lb

## 2019-05-16 DIAGNOSIS — Z23 Encounter for immunization: Secondary | ICD-10-CM | POA: Diagnosis not present

## 2019-05-16 DIAGNOSIS — E119 Type 2 diabetes mellitus without complications: Secondary | ICD-10-CM | POA: Diagnosis not present

## 2019-05-16 DIAGNOSIS — Z0000 Encounter for general adult medical examination without abnormal findings: Secondary | ICD-10-CM | POA: Diagnosis not present

## 2019-05-16 LAB — MICROALBUMIN / CREATININE URINE RATIO
Creatinine,U: 43.5 mg/dL
Microalb Creat Ratio: 1.6 mg/g (ref 0.0–30.0)
Microalb, Ur: <0.7 mg/dL (ref 0.0–1.9)

## 2019-05-16 NOTE — Patient Instructions (Signed)
Good to see you today  Look into getting the Shingrix vaccine to protect you from [place] Maintenance After Age 72 After age 43, you are at a higher risk for certain long-term diseases and infections as well as injuries from falls. Falls are a major cause of broken bones and head injuries in people who are older than age 67. Getting regular preventive care can help to keep you healthy and well. Preventive care includes getting regular testing and making lifestyle changes as recommended by your health care provider. Talk with your health care provider about:  Which screenings and tests you should have. A screening is a test that checks for a disease when you have no symptoms.  A diet and exercise plan that is right for you. What should I know about screenings and tests to prevent falls? Screening and testing are the best ways to find a health problem early. Early diagnosis and treatment give you the best chance of managing medical conditions that are common after age 51. Certain conditions and lifestyle choices may make you more likely to have a fall. Your health care provider may recommend:  Regular vision checks. Poor vision and conditions such as cataracts can make you more likely to have a fall. If you wear glasses, make sure to get your prescription updated if your vision changes.  Medicine review. Work with your health care provider to regularly review all of the medicines you are taking, including over-the-counter medicines. Ask your health care provider about any side effects that may make you more likely to have a fall. Tell your health care provider if any medicines that you take make you feel dizzy or sleepy.  Osteoporosis screening. Osteoporosis is a condition that causes the bones to get weaker. This can make the bones weak and cause them to break more easily.  Blood pressure screening. Blood pressure changes and medicines to control blood pressure can make you feel dizzy.   Strength and balance checks. Your health care provider may recommend certain tests to check your strength and balance while standing, walking, or changing positions.  Foot health exam. Foot pain and numbness, as well as not wearing proper footwear, can make you more likely to have a fall.  Depression screening. You may be more likely to have a fall if you have a fear of falling, feel emotionally low, or feel unable to do activities that you used to do.  Alcohol use screening. Using too much alcohol can affect your balance and may make you more likely to have a fall. What actions can I take to lower my risk of falls? General instructions  Talk with your health care provider about your risks for falling. Tell your health care provider if: ? You fall. Be sure to tell your health care provider about all falls, even ones that seem minor. ? You feel dizzy, sleepy, or off-balance.  Take over-the-counter and prescription medicines only as told by your health care provider. These include any supplements.  Eat a healthy diet and maintain a healthy weight. A healthy diet includes low-fat dairy products, low-fat (lean) meats, and fiber from whole grains, beans, and lots of fruits and vegetables. Home safety  Remove any tripping hazards, such as rugs, cords, and clutter.  Install safety equipment such as grab bars in bathrooms and safety rails on stairs.  Keep rooms and walkways well-lit. Activity   Follow a regular exercise program to stay fit. This will help you maintain your balance. Ask  your health care provider what types of exercise are appropriate for you.  If you need a cane or walker, use it as recommended by your health care provider.  Wear supportive shoes that have nonskid soles. Lifestyle  Do not drink alcohol if your health care provider tells you not to drink.  If you drink alcohol, limit how much you have: ? 0-1 drink a day for women. ? 0-2 drinks a day for men.  Be  aware of how much alcohol is in your drink. In the [place] one drink equals one typical bottle of beer (12 oz), one-half glass of wine (5 oz), or one shot of hard liquor (1 oz).  Do not use any products that contain nicotine or tobacco, such as cigarettes and e-cigarettes. If you need help quitting, ask your health care provider. Summary  Having a healthy lifestyle and getting preventive care can help to protect your health and wellness after age 60.  Screening and testing are the best way to find a health problem early and help you avoid having a fall. Early diagnosis and treatment give you the best chance for managing medical conditions that are more common for people who are older than age 9.  Falls are a major cause of broken bones and head injuries in people who are older than age 28. Take precautions to prevent a fall at home.  Work with your health care provider to learn what changes you can make to improve your health and wellness and to prevent falls. This information is not intended to replace advice given to you by your health care provider. Make sure you discuss any questions you have with your health care provider. Document Released: 09/05/2017 Document Revised: 02/13/2019 Document Reviewed: 09/05/2017 Elsevier Patient Education  2020 [employer].

## 2019-05-16 NOTE — Progress Notes (Signed)
Subjective:    Patient ID: Gabriela [name], female    DOB: October 09, 1946, 73 y.o.   MRN: [id number]  HPI This is a 73 year old female who presents today for complete physical exam. She had a medicare AWV 04/10/2019. She has been dong well. Her husband has stage 3 colon cancer and has undergone surgery and is currently taking oral chemotherapy. She stays busy with gardening, housework. She has good support system.    Last CPE- 04/17/2018 Mammo- 04/28/2019 Pap- 2015 Colonoscopy- 05/14/2018 Tdap-06/15/2017 Flu-annual Eye- annual Dental- regular Exercise- she does her own mowing, gardening, trimming and housework  Past Medical History:  Diagnosis Date  . Arthritis   . Cancer ([place])    left breast  . Chronic pain    from fall 2012  . Diabetes mellitus without complication (HCC)    diet controlled  . History of closed head injury 2012   accident work threw her 34ft  . Hyperlipemia   . Personal history of radiation therapy   . Spinal stenosis   . Urinary incontinence    wears a pessery   Past Surgical History:  Procedure Laterality Date  . ABDOMINAL HYSTERECTOMY    . BREAST LUMPECTOMY  1999   left  . BREAST LUMPECTOMY Left 2016  . BREAST LUMPECTOMY WITH RADIOACTIVE SEED LOCALIZATION Left 03/08/2018   Procedure: LEFT BREAST LUMPECTOMY WITH RADIOACTIVE SEED LOCALIZATION;  Surgeon: Excell Seltzer, MD;  Location: [place];  Service: General;  Laterality: Left;  . COLONOSCOPY    . COLONOSCOPY WITH PROPOFOL N/A 05/14/2018   Procedure: COLONOSCOPY WITH PROPOFOL;  Surgeon: Jonathon Bellows, MD;  Location: [facility] ENDOSCOPY;  Service: Gastroenterology;  Laterality: N/A;  . ORIF WRIST FRACTURE Right 06/15/2017   Procedure: OPEN REDUCTION INTERNAL FIXATION (ORIF) WRIST FRACTURE WITH REDUCTION AND PINNINGT OF CARPAL DISLOCATION;  Surgeon: Milly Jakob, MD;  Location: [place];  Service: Orthopedics;  Laterality: Right;  . RADIOACTIVE SEED GUIDED PARTIAL MASTECTOMY WITH AXILLARY SENTINEL  LYMPH NODE BIOPSY Left 02/08/2015   Procedure: RADIOACTIVE SEED LOCALIZATION LUMPECTOMY WITH LEFT AXILLARY SENTINEL LYMPH NODE BIOPSY;  Surgeon: Excell Seltzer, MD;  Location: [place];  Service: General;  Laterality: Left;  . SHOULDER ARTHROSCOPY  2012   left  . TONSILLECTOMY     Family History  Problem Relation Age of Onset  . Colon cancer Mother        possibly caused by medication during pregnancy  . Breast cancer Maternal Aunt        dx in 59s  . Cervical cancer Maternal Aunt        dx in 29s   Social History   Tobacco Use  . Smoking status: Never Smoker  . Smokeless tobacco: Never Used  Substance Use Topics  . Alcohol use: No  . Drug use: No      Review of Systems  Constitutional: Negative.   HENT: Negative.   Eyes: Negative.   Respiratory: Negative.   Cardiovascular: Negative.   Gastrointestinal: Negative.   Endocrine: Negative.   Genitourinary: Positive for frequency (since bladder suspension many years ago). Negative for dysuria and urgency.  Musculoskeletal:       Occasional left knee popping, no pain, no interference with daily activities.   Allergic/Immunologic: Negative.   Neurological: Negative.   Hematological: Negative.   Psychiatric/Behavioral: Negative.        Objective:   Physical Exam Physical Exam  Constitutional: She is oriented to person, place, and time. She appears well-developed and well-nourished. No distress.  HENT:  Head: Normocephalic and atraumatic.  Right Ear: External ear normal.  Left Ear: External ear normal.  Nose: Nose normal.  Mouth/Throat: Oropharynx is clear and moist. No oropharyngeal exudate.  Eyes: Conjunctivae are normal. Pupils are equal, round, and reactive to light.  Neck: Normal range of motion. Neck supple. No JVD present. No thyromegaly present.  Cardiovascular: Normal rate, regular rhythm, normal heart sounds and intact distal pulses.   Pulmonary/Chest: Effort normal and breath sounds  normal. Right breast exhibits no inverted nipple, no mass, no nipple discharge, no skin change and no tenderness. Left breast exhibits no inverted nipple, no mass, no nipple discharge, no skin change and no tenderness. Breasts are symmetrical.  Abdominal: Soft. Bowel sounds are normal. She exhibits no distension and no mass. There is no tenderness. There is no rebound and no guarding.  Musculoskeletal: Normal range of motion. She exhibits no edema or tenderness.  Lymphadenopathy:    She has no cervical adenopathy.  Neurological: She is alert and oriented to person, place, and time. She has normal reflexes.  Skin: Skin is warm and dry. She is not diaphoretic.  Psychiatric: She has a normal mood and affect. Her behavior is normal. Judgment and thought content normal.  Vitals reviewed.  BP 100/62   Pulse (!) 50   Temp 98.5 F (36.9 C)   Resp 16   Ht 5' 6.75" (1.695 m)   Wt 173 lb 12 oz (78.8 kg)   BMI 27.42 kg/m  Wt Readings from Last 3 Encounters:  05/16/19 173 lb 12 oz (78.8 kg)  04/14/19 174 lb 8 oz (79.2 kg)  11/19/18 181 lb (82.1 kg)   Depression screen [facility] 2/9 04/10/2019 04/17/2018 04/08/2018 12/03/2015  Decreased Interest 0 0 0 0  Down, Depressed, Hopeless 0 0 0 0  PHQ - 2 Score 0 0 0 0  Altered sleeping 0 - 0 -  Tired, decreased energy 0 - 0 -  Change in appetite 0 - 0 -  Feeling bad or failure about yourself  0 - 0 -  Trouble concentrating 0 - 0 -  Moving slowly or fidgety/restless 0 - 0 -  Suicidal thoughts 0 - 0 -  PHQ-9 Score 0 - 0 -  Difficult doing work/chores Not difficult at all - Not difficult at all -       Assessment & Plan:  1. Annual physical exam - Discussed and encouraged healthy lifestyle choices- adequate sleep, regular exercise, stress management and healthy food choices.   2. Need for pneumococcal vaccination - Pneumococcal polysaccharide vaccine 23-valent greater than or equal to 2yo subcutaneous/IM  3. Diabetes mellitus type II, non insulin dependent  ([place]) - followed by endocrinology - Microalbumin / creatinine urine ratio  - follow up in 6 months Clarene Reamer, FNP-BC  [facility], [place]  05/16/2019 9:34 AM

## 2019-05-19 ENCOUNTER — Encounter: Payer: Self-pay | Admitting: Family Medicine

## 2019-05-19 ENCOUNTER — Other Ambulatory Visit: Payer: Self-pay

## 2019-05-22 ENCOUNTER — Other Ambulatory Visit: Payer: Self-pay

## 2019-05-22 ENCOUNTER — Encounter: Payer: Self-pay | Admitting: Internal Medicine

## 2019-05-22 ENCOUNTER — Ambulatory Visit: Payer: Medicare Other | Admitting: Internal Medicine

## 2019-05-22 VITALS — BP 128/70 | HR 56 | Ht 66.75 in | Wt 175.0 lb

## 2019-05-22 DIAGNOSIS — G629 Polyneuropathy, unspecified: Secondary | ICD-10-CM

## 2019-05-22 DIAGNOSIS — E785 Hyperlipidemia, unspecified: Secondary | ICD-10-CM | POA: Diagnosis not present

## 2019-05-22 DIAGNOSIS — E119 Type 2 diabetes mellitus without complications: Secondary | ICD-10-CM | POA: Diagnosis not present

## 2019-05-22 LAB — LIPID PANEL
Cholesterol: 146 mg/dL (ref 0–200)
HDL: 50.1 mg/dL (ref >39.00)
LDL Cholesterol: 85 mg/dL (ref 0–99)
NonHDL: 95.92
Total CHOL/HDL Ratio: 3
Triglycerides: 55 mg/dL (ref 0.0–149.0)
VLDL: 11 mg/dL (ref 0.0–40.0)

## 2019-05-22 LAB — POCT GLYCOSYLATED HEMOGLOBIN (HGB A1C): Hemoglobin A1C: 5.9 % — AB (ref 4.0–5.6)

## 2019-05-22 NOTE — Patient Instructions (Addendum)
Please decrease: - Metformin ER 500 mg with dinner  Please return in 6 months with your sugar log

## 2019-05-22 NOTE — Progress Notes (Signed)
Patient ID: Gabriela [name], female   DOB: 07-27-1946, 73 y.o.   MRN: [id number]   HPI: Gabriela [name] is a 73 y.o.-year-old female, returning for f/u for DM2, dx in 04/2014, non-insulin-dependent, controlled, without long-term complications. Last visit 6 months ago.  Reviewed HbA1c levels: Lab Results  Component Value Date   HGBA1C 5.9 (A) 11/19/2018   HGBA1C 6.4 04/08/2018   HGBA1C 6 11/16/2017   HGBA1C 5.9 05/16/2017   HGBA1C 5.8 12/27/2016  08/08/2016: HbA1c 7.0% 02/04/2016: HbA1c 6.7% 05/12/2015: HbA1c 6.3% 11/17/2014: HbA1c 6.1% 08/07/2014: HbA1c 6.4%  Pt is on: - Metformin ER 500 mg 2x a day >> 500 mg with dinner >> but increased it back to 2 tabs a day >> gas She could not tolerate Metformin IR: HA, reflux, borborygmi  Pt checks her sugars once a day: - am: 130s-160, 170 >> n/c  - 2h after b'fast: 95-122, 130 >> 96-120 >> 87-120 >> 100-110 - before lunch: n/c  - 2h after lunch: 87-123 >> 98-120 >> 88-114 >> 90-112 - before dinner: n/c - 2h after dinner: 93-131 >> 95-135, 201 (steroid inj) >> 90-126 >> 93-115 - bedtime: n/c - nighttime: n/c Lowest sugar was 87 >> 90; it is unclear at which CBG level she has hypoglycemia awareness. Highest sugar was 180 (steroids) >> 201 (steroid inj) >> 126 >> 115.  Glucometer: One Touch Ultra  Pt's meals are: - Breakfast: multi-grain sandwich with egg salad - Lunch: may skip; multi-grain crackers with salad or homemade pimento cheese - Dinner: grilled or broiled meat and vegetable - Snacks: apple/pear; almonds/cashews  -No CKD, last BUN/creatinine:  Lab Results  Component Value Date   BUN 14 04/14/2019   BUN 18 09/28/2018   CREATININE 0.83 04/14/2019   CREATININE 0.75 09/28/2018   -+ HL;  last set of lipids: Lab Results  Component Value Date   CHOL 169 09/19/2017   HDL 48.40 09/19/2017   LDLCALC 102 (H) 09/19/2017   TRIG 92.0 09/19/2017   CHOLHDL 3 09/19/2017  02/04/2016: 181/84/43/121 She is not on a statin but  takes omega-3 fatty acids (1200 mg twice a day) and also garliq. - last eye exam was 07/2018: No DR -She has improved numbness and tingling in her feet.  She was on alpha-lipoic acid and B complex.  She was on B12 injections >> po B12.  She has a history of subdural hematoma 6 years ago after an accident when she was working at [employer]. Since then, she has hand tremors. In 2016 she has been dx'ed with BrCA and also had a total hysterectomy and a pelvic repair surgery.   She likes to work in her garden.  Husband dx'ed with colon cancer - had colectomy at the beginning of 2020, and he had 2 AMIs Spring 2019.  ROS: Constitutional: no weight gain/no weight loss, no fatigue, no subjective hyperthermia, no subjective hypothermia Eyes: no blurry vision, no xerophthalmia ENT: no sore throat, no nodules palpated in neck, no dysphagia, no odynophagia, no hoarseness Cardiovascular: no CP/no SOB/no palpitations/no leg swelling Respiratory: no cough/no SOB/no wheezing Gastrointestinal: no N/no V/no D/no C/no acid reflux Musculoskeletal: no muscle aches/no joint aches Skin: no rashes, no hair loss Neurological: no tremors/no numbness/no tingling/no dizziness  I reviewed pt's medications, allergies, PMH, social hx, family hx, and changes were documented in the history of present illness. Otherwise, unchanged from my initial visit note.  Past Medical History:  Diagnosis Date  . Arthritis   . Cancer ([place])    left  breast  . Chronic pain    from fall 2012  . Diabetes mellitus without complication (HCC)    diet controlled  . History of closed head injury 2012   accident work threw her 18f  . Hyperlipemia   . Personal history of radiation therapy   . Spinal stenosis   . Urinary incontinence    wears a pessery   Past Surgical History:  Procedure Laterality Date  . ABDOMINAL HYSTERECTOMY    . BREAST LUMPECTOMY  1999   left  . BREAST LUMPECTOMY Left 2016  . BREAST LUMPECTOMY WITH RADIOACTIVE  SEED LOCALIZATION Left 03/08/2018   Procedure: LEFT BREAST LUMPECTOMY WITH RADIOACTIVE SEED LOCALIZATION;  Surgeon: HExcell Seltzer MD;  [place] [place]  Service: General;  Laterality: Left;  . COLONOSCOPY    . COLONOSCOPY WITH PROPOFOL N/A 05/14/2018   Procedure: COLONOSCOPY WITH PROPOFOL;  Surgeon: AJonathon Bellows MD;  [facility];  Service: Gastroenterology;  Laterality: N/A;  . ORIF WRIST FRACTURE Right 06/15/2017   Procedure: OPEN REDUCTION INTERNAL FIXATION (ORIF) WRIST FRACTURE WITH REDUCTION AND PINNINGT OF CARPAL DISLOCATION;  Surgeon: TMilly Jakob MD;  [place] [facility]  Service: Orthopedics;  Laterality: Right;  . RADIOACTIVE SEED GUIDED PARTIAL MASTECTOMY WITH AXILLARY SENTINEL LYMPH NODE BIOPSY Left 02/08/2015   Procedure: RADIOACTIVE SEED LOCALIZATION LUMPECTOMY WITH LEFT AXILLARY SENTINEL LYMPH NODE BIOPSY;  Surgeon: BExcell Seltzer MD;  [place] [facility]  Service: General;  Laterality: Left;  . SHOULDER ARTHROSCOPY  2012   left  . TONSILLECTOMY     Social History   Social History  . Marital status: Married    Spouse name: N/A  . Number of children: 3   Occupational History  . retired   Social History Main Topics  . Smoking status: Never Smoker  . Smokeless tobacco: Never Used  . Alcohol use No  . Drug use: No   Current Outpatient Medications on File Prior to Visit  Medication Sig Dispense Refill  . Biotin 1000 MCG tablet Take 1,000 mcg by mouth daily.    . Blood Glucose Monitoring Suppl (ONE TOUCH ULTRA 2) w/Device KIT USE AS DIRECTED    . cetirizine (ZYRTEC) 10 MG tablet Take 10 mg by mouth.    . Cholecalciferol (VITAMIN D) 2000 units tablet Take by mouth.    . Garlic 16295MG CAPS Take by mouth.    .Marland Kitchenglucosamine-chondroitin 500-400 MG tablet Take 1 tablet by mouth daily.    . metFORMIN (GLUCOPHAGE-XR) 500 MG 24 hr tablet TAKE 1 TABLET BY MOUTH TWO  TIMES DAILY 180 tablet 3  . Multiple Vitamins-Minerals  (MULTIVITAMIN WOMEN 50+ PO) Take 1 tablet by mouth daily.    . Omega-3 1000 MG CAPS Take by mouth.    .Glory RosebushDELICA LANCETS 328UMISC USE TO CHECK SUGAR DAILY 100 each 5  . ONETOUCH ULTRA test strip USE ONCE DAILY. 100 each 11  . tamoxifen (NOLVADEX) 20 MG tablet Take 1 tablet (20 mg total) by mouth daily. 90 tablet 4  . vitamin B-12 (CYANOCOBALAMIN) 1000 MCG tablet Take 1,000 mcg by mouth daily. dissovable     No current facility-administered medications on file prior to visit.    Allergies  Allergen Reactions  . Oxycontin [Oxycodone Hcl] Shortness Of Breath  . Sulfa Antibiotics Itching   Family history: - DM in sister, MGM.  PE: BP 128/70   Pulse (!) 56   Ht 5' 6.75" (1.695 m)   Wt 175 lb (79.4 kg)  SpO2 98%   BMI 27.61 kg/m  Wt Readings from Last 3 Encounters:  05/22/19 175 lb (79.4 kg)  05/16/19 173 lb 12 oz (78.8 kg)  04/14/19 174 lb 8 oz (79.2 kg)   Constitutional: slightly overweight, in NAD Eyes: PERRLA, EOMI, no exophthalmos ENT: moist mucous membranes, no thyromegaly, no cervical lymphadenopathy Cardiovascular: RRR, No MRG Respiratory: CTA B Gastrointestinal: abdomen soft, NT, ND, BS+ Musculoskeletal: no deformities, strength intact in all 4 Skin: moist, warm, no rashes Neurological: no tremor with outstretched hands, DTR normal in all 4  ASSESSMENT: 1. DM2, non-insulin-dependent, controlled, without long-term complications  2. HL  3. PN  PLAN:  1. Pt with well-controlled type 2 diabetes, on low-dose metformin ER.  Her sugars are usually well controlled except for the times when she has steroid injections.  She usually has them in the spine, 6 months apart. -At last visit, sugars were excellent and we were able to decrease the metformin to only 1 tablet a day.  At that time she was having gas and bloating after taking metformin in the morning so I advised her to only take the dinnertime dose.  However, she had a lot of stress with her husband being sick  and she increased the metformin back to twice a day as she was afraid that her sugars will increase.  She again complains of GI symptoms with it. -We reviewed her carefully kept log and sugars are all at goal.  I again advised her to decrease the dose of metformin to only once a day. - I suggested to:  Patient Instructions  Please decrease: - Metformin ER 500 mg with dinner  Please return in 6 months with your sugar log  - we checked her HbA1c: 5.9% (stable, excellent) - advised to check sugars at different times of the day - 1x a day, rotating check times - advised for yearly eye exams >> she is UTD - return to clinic in 6 months   2. HL - Reviewed latest lipid panel from 09/2017: LDL slightly above target, the rest of the fractions at goal Lab Results  Component Value Date   CHOL 169 09/19/2017   HDL 48.40 09/19/2017   LDLCALC 102 (H) 09/19/2017   TRIG 92.0 09/19/2017   CHOLHDL 3 09/19/2017  -She is not on a statin but she takes omega-3 fatty acids and  Garlic -She is due for lipid panel-we will check today.  She only had sugar-free pudding for breakfast today.  3. PN -She was previously on alpha-lipoic acid and B complex, now off -Also started B12 injections before last visit, but stopped them since then -Symptoms of numbness and tingling are almost resolved  Office Visit on 05/22/2019  Component Date Value Ref Range Status  . Cholesterol 05/22/2019 146  0 - 200 mg/dL Final   ATP III Classification       Desirable:  < 200 mg/dL               Borderline High:  200 - 239 mg/dL          High:  > = 240 mg/dL  . Triglycerides 05/22/2019 55.0  0.0 - 149.0 mg/dL Final   Normal:  <150 mg/dLBorderline High:  150 - 199 mg/dL  . HDL 05/22/2019 50.10  >39.00 mg/dL Final  . VLDL 05/22/2019 11.0  0.0 - 40.0 mg/dL Final  . LDL Cholesterol 05/22/2019 85  0 - 99 mg/dL Final  . Total CHOL/HDL Ratio 05/22/2019 3   Final  Men          Women1/2 Average Risk     3.4           3.3Average Risk          5.0          4.42X Average Risk          9.6          7.13X Average Risk          15.0          11.0                      . NonHDL 05/22/2019 95.92   Final   NOTE:  Non-HDL goal should be 30 mg/dL higher than patient's LDL goal (i.e. LDL goal of < 70 mg/dL, would have non-HDL goal of < 100 mg/dL)  . Hemoglobin A1C 05/22/2019 5.9* 4.0 - 5.6 % Final   Lipid panel improved.  Philemon Kingdom, MD PhD [facility] Endocrinology

## 2019-09-09 DIAGNOSIS — H2513 Age-related nuclear cataract, bilateral: Secondary | ICD-10-CM | POA: Diagnosis not present

## 2019-09-09 LAB — HM DIABETES EYE EXAM

## 2019-09-16 ENCOUNTER — Other Ambulatory Visit: Payer: Self-pay | Admitting: Internal Medicine

## 2019-09-22 ENCOUNTER — Encounter: Payer: Self-pay | Admitting: Family Medicine

## 2019-09-26 DIAGNOSIS — E119 Type 2 diabetes mellitus without complications: Secondary | ICD-10-CM | POA: Diagnosis not present

## 2019-09-26 DIAGNOSIS — H2512 Age-related nuclear cataract, left eye: Secondary | ICD-10-CM | POA: Diagnosis not present

## 2019-09-30 ENCOUNTER — Other Ambulatory Visit: Payer: Self-pay

## 2019-09-30 ENCOUNTER — Encounter: Payer: Self-pay | Admitting: *Deleted

## 2019-10-06 ENCOUNTER — Other Ambulatory Visit
Admission: RE | Admit: 2019-10-06 | Discharge: 2019-10-06 | Disposition: A | Discharge status: Home / Self Care | Payer: Medicare Other | Source: Ambulatory Visit | Attending: Ophthalmology | Admitting: Ophthalmology

## 2019-10-06 DIAGNOSIS — Z20828 Contact with and (suspected) exposure to other viral communicable diseases: Secondary | ICD-10-CM | POA: Insufficient documentation

## 2019-10-06 DIAGNOSIS — Z01812 Encounter for preprocedural laboratory examination: Secondary | ICD-10-CM | POA: Diagnosis not present

## 2019-10-07 LAB — SARS CORONAVIRUS 2 (TAT 6-24 HRS): SARS Coronavirus 2: NEGATIVE

## 2019-10-07 NOTE — Discharge Instructions (Signed)

## 2019-10-08 ENCOUNTER — Ambulatory Visit: Payer: Medicare Other | Admitting: Anesthesiology

## 2019-10-08 ENCOUNTER — Encounter
Admission: RE | Disposition: A | Discharge status: Home / Self Care | Payer: Self-pay | Source: Home / Self Care | Attending: Ophthalmology

## 2019-10-08 ENCOUNTER — Ambulatory Visit
Admission: RE | Admit: 2019-10-08 | Discharge: 2019-10-08 | Disposition: A | Discharge status: Home / Self Care | Payer: Medicare Other | Attending: Ophthalmology | Admitting: Ophthalmology

## 2019-10-08 ENCOUNTER — Other Ambulatory Visit: Payer: Self-pay

## 2019-10-08 DIAGNOSIS — M1990 Unspecified osteoarthritis, unspecified site: Secondary | ICD-10-CM | POA: Diagnosis not present

## 2019-10-08 DIAGNOSIS — H25812 Combined forms of age-related cataract, left eye: Secondary | ICD-10-CM | POA: Diagnosis not present

## 2019-10-08 DIAGNOSIS — E785 Hyperlipidemia, unspecified: Secondary | ICD-10-CM | POA: Insufficient documentation

## 2019-10-08 DIAGNOSIS — H2512 Age-related nuclear cataract, left eye: Secondary | ICD-10-CM | POA: Diagnosis not present

## 2019-10-08 DIAGNOSIS — E119 Type 2 diabetes mellitus without complications: Secondary | ICD-10-CM | POA: Insufficient documentation

## 2019-10-08 DIAGNOSIS — Z7984 Long term (current) use of oral hypoglycemic drugs: Secondary | ICD-10-CM | POA: Insufficient documentation

## 2019-10-08 DIAGNOSIS — Z79899 Other long term (current) drug therapy: Secondary | ICD-10-CM | POA: Diagnosis not present

## 2019-10-08 HISTORY — PX: CATARACT EXTRACTION W/PHACO: SHX586

## 2019-10-08 LAB — GLUCOSE, CAPILLARY
Glucose-Capillary: 100 mg/dL — ABNORMAL HIGH (ref 70–99)
Glucose-Capillary: 92 mg/dL (ref 70–99)

## 2019-10-08 SURGERY — PHACOEMULSIFICATION, CATARACT, WITH IOL INSERTION
Anesthesia: Monitor Anesthesia Care | Site: Eye | Laterality: Left

## 2019-10-08 MED ORDER — LIDOCAINE HCL (PF) 2 % IJ SOLN
INTRAOCULAR | Status: DC | PRN
  Administered 2019-10-08: 2 mL

## 2019-10-08 MED ORDER — ARMC OPHTHALMIC DILATING DROPS
1.0000 "application " | OPHTHALMIC | Status: DC | PRN
  Administered 2019-10-08 (×3): 1 via OPHTHALMIC

## 2019-10-08 MED ORDER — FENTANYL CITRATE (PF) 100 MCG/2ML IJ SOLN
INTRAMUSCULAR | Status: DC | PRN
  Administered 2019-10-08: 100 ug via INTRAVENOUS

## 2019-10-08 MED ORDER — LACTATED RINGERS IV SOLN: INTRAVENOUS | Status: DC

## 2019-10-08 MED ORDER — TETRACAINE HCL 0.5 % OP SOLN
1.0000 [drp] | OPHTHALMIC | Status: DC | PRN
  Administered 2019-10-08 (×3): 1 [drp] via OPHTHALMIC

## 2019-10-08 MED ORDER — MOXIFLOXACIN HCL 0.5 % OP SOLN
1.0000 [drp] | OPHTHALMIC | Status: DC | PRN
  Administered 2019-10-08 (×3): 1 [drp] via OPHTHALMIC

## 2019-10-08 MED ORDER — EPINEPHRINE PF 1 MG/ML IJ SOLN
INTRAOCULAR | Status: DC | PRN
  Administered 2019-10-08: 56 mL via OPHTHALMIC

## 2019-10-08 MED ORDER — NA HYALUR & NA CHOND-NA HYALUR 0.4-0.35 ML IO KIT
PACK | INTRAOCULAR | Status: DC | PRN
  Administered 2019-10-08: 1 mL via INTRAOCULAR

## 2019-10-08 MED ORDER — MIDAZOLAM HCL 2 MG/2ML IJ SOLN
INTRAMUSCULAR | Status: DC | PRN
  Administered 2019-10-08: 2 mg via INTRAVENOUS

## 2019-10-08 MED ORDER — CEFUROXIME OPHTHALMIC INJECTION 1 MG/0.1 ML
INJECTION | OPHTHALMIC | Status: DC | PRN
  Administered 2019-10-08: 0.1 mL via INTRACAMERAL

## 2019-10-08 MED ORDER — BRIMONIDINE TARTRATE-TIMOLOL 0.2-0.5 % OP SOLN
OPHTHALMIC | Status: DC | PRN
  Administered 2019-10-08: 1 [drp] via OPHTHALMIC

## 2019-10-08 SURGICAL SUPPLY — 16 items
CANNULA ANT/CHMB 27G (MISCELLANEOUS) ×1 IMPLANT
CANNULA ANT/CHMB 27GA (MISCELLANEOUS) ×3 IMPLANT
GLOVE SURG LX 7.5 STRW (GLOVE) ×2
GLOVE SURG LX STRL 7.5 STRW (GLOVE) ×1 IMPLANT
GLOVE SURG TRIUMPH 8.0 PF LTX (GLOVE) ×3 IMPLANT
GOWN STRL REUS W/ TWL LRG LVL3 (GOWN DISPOSABLE) ×2 IMPLANT
GOWN STRL REUS W/TWL LRG LVL3 (GOWN DISPOSABLE) ×6
LENS IOL TECNIS ITEC 21.0 (Intraocular Lens) ×2 IMPLANT
MARKER SKIN DUAL TIP RULER LAB (MISCELLANEOUS) ×3 IMPLANT
PACK CATARACT BRASINGTON (MISCELLANEOUS) ×3 IMPLANT
PACK EYE AFTER SURG (MISCELLANEOUS) ×3 IMPLANT
PACK OPTHALMIC (MISCELLANEOUS) ×3 IMPLANT
SYR 3ML LL SCALE MARK (SYRINGE) ×3 IMPLANT
SYR TB 1ML LUER SLIP (SYRINGE) ×3 IMPLANT
WATER STERILE IRR 500ML POUR (IV SOLUTION) ×3 IMPLANT
WIPE NON LINTING 3.25X3.25 (MISCELLANEOUS) ×3 IMPLANT

## 2019-10-08 NOTE — Op Note (Signed)
OPERATIVE NOTE  Gabriela [name] [id number] 10/08/2019   PREOPERATIVE DIAGNOSIS:  Nuclear sclerotic cataract left eye. H25.12   POSTOPERATIVE DIAGNOSIS:    Nuclear sclerotic cataract left eye.     PROCEDURE:  Phacoemusification with posterior chamber intraocular lens placement of the left eye  Ultrasound time: Procedure(s) with comments: CATARACT EXTRACTION PHACO AND INTRAOCULAR LENS PLACEMENT (IOC) LEFT 3.47, 00:46.0,  18.8% (Left) - Diabetes - oral meds  LENS:   Implant Name Type Inv. Item Serial No. Manufacturer Lot No. LRB No. Used Action  LENS IOL DIOP 21.0 - [id number] Intraocular Lens LENS IOL DIOP 21.0 [id number]  Left 1 Implanted      SURGEON:  Wyonia Hough, MD   ANESTHESIA:  Topical with tetracaine drops and 2% Xylocaine jelly, augmented with 1% preservative-free intracameral lidocaine.    COMPLICATIONS:  None.   DESCRIPTION OF PROCEDURE:  The patient was identified in the holding room and transported to the operating room and placed in the supine position under the operating microscope.  The left eye was identified as the operative eye and it was prepped and draped in the usual sterile ophthalmic fashion.   A 1 millimeter clear-corneal paracentesis was made at the 1:30 position.  0.5 ml of preservative-free 1% lidocaine was injected into the anterior chamber.  The anterior chamber was filled with Viscoat viscoelastic.  A 2.4 millimeter keratome was used to make a near-clear corneal incision at the 10:30 position.  .  A curvilinear capsulorrhexis was made with a cystotome and capsulorrhexis forceps.  Balanced salt solution was used to hydrodissect and hydrodelineate the nucleus.   Phacoemulsification was then used in stop and chop fashion to remove the lens nucleus and epinucleus.  The remaining cortex was then removed using the irrigation and aspiration handpiece. Provisc was then placed into the capsular bag to distend it for lens placement.  A lens was then  injected into the capsular bag.  The remaining viscoelastic was aspirated.   Wounds were hydrated with balanced salt solution.  The anterior chamber was inflated to a physiologic pressure with balanced salt solution.  No wound leaks were noted. Cefuroxime 0.1 ml of a 10mg /ml solution was injected into the anterior chamber for a dose of 1 mg of intracameral antibiotic at the completion of the case.   Timolol and Brimonidine drops were applied to the eye.  The patient was taken to the recovery room in stable condition without complications of anesthesia or surgery.  [name] 10/08/2019, 1:54 PM

## 2019-10-08 NOTE — Transfer of Care (Signed)
Immediate Anesthesia Transfer of Care Note  Patient: Gabriela [name]  Procedure(s) Performed: CATARACT EXTRACTION PHACO AND INTRAOCULAR LENS PLACEMENT (IOC) LEFT 3.47, 00:46.0,  18.8% (Left Eye)  Patient Location: PACU  Anesthesia Type: MAC  Level of Consciousness: awake, alert  and patient cooperative  Airway and Oxygen Therapy: Patient Spontanous Breathing and Patient connected to supplemental oxygen  Post-op Assessment: Post-op Vital signs reviewed, Patient's Cardiovascular Status Stable, Respiratory Function Stable, Patent Airway and No signs of Nausea or vomiting  Post-op Vital Signs: Reviewed and stable  Complications: No apparent anesthesia complications

## 2019-10-08 NOTE — H&P (Signed)

## 2019-10-08 NOTE — Anesthesia Postprocedure Evaluation (Signed)
Anesthesia Post Note  Patient: Gabriela [name]  Procedure(s) Performed: CATARACT EXTRACTION PHACO AND INTRAOCULAR LENS PLACEMENT (IOC) LEFT 3.47, 00:46.0,  18.8% (Left Eye)     Patient location during evaluation: PACU Anesthesia Type: MAC Level of consciousness: awake and alert Pain management: pain level controlled Vital Signs Assessment: post-procedure vital signs reviewed and stable Respiratory status: spontaneous breathing, nonlabored ventilation, respiratory function stable and patient connected to nasal cannula oxygen Cardiovascular status: stable and blood pressure returned to baseline Postop Assessment: no apparent nausea or vomiting Anesthetic complications: no    Veda Canning

## 2019-10-08 NOTE — Anesthesia Preprocedure Evaluation (Signed)
Anesthesia Evaluation  Patient identified by MRN, date of birth, ID band Patient awake    Airway Mallampati: II  TM Distance: >3 FB     Dental   Pulmonary neg pulmonary ROS,    breath sounds clear to auscultation       Cardiovascular  Rhythm:Regular Rate:Normal  HLD   Neuro/Psych Hx closed head injury 2012    GI/Hepatic   Endo/Other  diabetes, Type 2, Oral Hypoglycemic Agents  Renal/GU      Musculoskeletal  (+) Arthritis ,   Abdominal   Peds  Hematology   Anesthesia Other Findings Breast cancer  Reproductive/Obstetrics                             Anesthesia Physical Anesthesia Plan  ASA: II  Anesthesia Plan: MAC   Post-op Pain Management:    Induction:   PONV Risk Score and Plan: 2 and TIVA, Treatment may vary due to age or medical condition and Midazolam  Airway Management Planned:   Additional Equipment:   Intra-op Plan:   Post-operative Plan:   Informed Consent: I have reviewed the patients History and Physical, chart, labs and discussed the procedure including the risks, benefits and alternatives for the proposed anesthesia with the patient or authorized representative who has indicated his/her understanding and acceptance.       Plan Discussed with: CRNA  Anesthesia Plan Comments:         Anesthesia Quick Evaluation

## 2019-10-08 NOTE — Anesthesia Procedure Notes (Signed)
Procedure Name: MAC Performed by: Izetta Dakin, CRNA Pre-anesthesia Checklist: Timeout performed, Patient being monitored, Suction available, Emergency Drugs available and Patient identified Patient Re-evaluated:Patient Re-evaluated prior to induction Oxygen Delivery Method: Nasal cannula

## 2019-10-09 ENCOUNTER — Encounter: Payer: Self-pay | Admitting: Ophthalmology

## 2019-11-20 ENCOUNTER — Other Ambulatory Visit: Payer: Self-pay

## 2019-11-24 ENCOUNTER — Ambulatory Visit (INDEPENDENT_AMBULATORY_CARE_PROVIDER_SITE_OTHER): Payer: Medicare Other | Admitting: Internal Medicine

## 2019-11-24 ENCOUNTER — Encounter: Payer: Self-pay | Admitting: Internal Medicine

## 2019-11-24 ENCOUNTER — Other Ambulatory Visit: Payer: Self-pay

## 2019-11-24 VITALS — BP 126/78 | HR 68 | Ht 66.75 in | Wt 181.0 lb

## 2019-11-24 DIAGNOSIS — G629 Polyneuropathy, unspecified: Secondary | ICD-10-CM

## 2019-11-24 DIAGNOSIS — E119 Type 2 diabetes mellitus without complications: Secondary | ICD-10-CM | POA: Diagnosis not present

## 2019-11-24 DIAGNOSIS — E785 Hyperlipidemia, unspecified: Secondary | ICD-10-CM

## 2019-11-24 LAB — POCT GLYCOSYLATED HEMOGLOBIN (HGB A1C): Hemoglobin A1C: 6.1 % — AB (ref 4.0–5.6)

## 2019-11-24 MED ORDER — METFORMIN HCL ER 500 MG PO TB24: 500.0000 mg | ORAL_TABLET | Freq: Every day | ORAL | 3 refills | Status: DC

## 2019-11-24 NOTE — Progress Notes (Signed)
Patient ID: Gabriela [name], female   DOB: 1946/09/20, 74 y.o.   MRN: [id number]   This visit occurred during the SARS-CoV-2 public health emergency.  Safety protocols were in place, including screening questions prior to the visit, additional usage of staff PPE, and extensive cleaning of exam room while observing appropriate contact time as indicated for disinfecting solutions.   HPI: Gabriela [name] is a 74 y.o.-year-old female, returning for f/u for DM2, dx in 04/2014, non-insulin-dependent, controlled, without long-term complications. Last visit 6 months ago.  Reviewed HbA1c levels: Lab Results  Component Value Date   HGBA1C 5.9 (A) 05/22/2019   HGBA1C 5.9 (A) 11/19/2018   HGBA1C 6.4 04/08/2018   HGBA1C 6 11/16/2017   HGBA1C 5.9 05/16/2017   HGBA1C 5.8 12/27/2016  08/08/2016: HbA1c 7.0% 02/04/2016: HbA1c 6.7% 05/12/2015: HbA1c 6.3% 11/17/2014: HbA1c 6.1% 08/07/2014: HbA1c 6.4%  Pt is on: - Metformin ER 500 mg 2x a day >> 500 mg with dinner (she had increased gas with higher doses) She could not tolerate Metformin IR: HA, reflux, borborygmi  Pt checks her sugars once a day: - am: 130s-160, 170 >> n/c  - 2h after b'fast: 95-122, 130 >> 96-120 >> 87-120 >> 100-110 >> 93-110 - before lunch: n/c  - 2h after lunch: 87-123 >> 98-120 >> 88-114 >> 90-112 >> 94-118 - before dinner: n/c - 2h after dinner: 95-135, 201 (steroid inj) >> 90-126 >> 93-115 >> 86-121 - bedtime: n/c - nighttime: n/c Lowest sugar was 87 >> 90 >> 86; it is unclear at which level she has hypoglycemia awareness. Highest sugar was 201 (steroid inj) >> 126 >> 115 >> 121.  Glucometer: One Touch Ultra  Pt's meals are: - Breakfast: multi-grain sandwich with egg salad - Lunch: may skip; multi-grain crackers with salad or homemade pimento cheese - Dinner: grilled or broiled meat and vegetable - Snacks: apple/pear; almonds/cashews  -No CKD, last BUN/creatinine:  Lab Results  Component Value Date   BUN 14  04/14/2019   BUN 18 09/28/2018   CREATININE 0.83 04/14/2019   CREATININE 0.75 09/28/2018   -+ HL;  last set of lipids: Lab Results  Component Value Date   CHOL 146 05/22/2019   HDL 50.10 05/22/2019   LDLCALC 85 05/22/2019   TRIG 55.0 05/22/2019   CHOLHDL 3 05/22/2019  02/04/2016: 181/84/43/121 She is not on a statin but takes omega-3 fatty acids 1200 mg twice a day and also garliq. - last eye exam was fall 2020: No DR. + cataract - had sx. - Improved numbness and tingling in her feet.  She was on alpha-lipoic acid and B complex.  She was on B12 injections, now on p.o. B12  She has a history of subdural hematoma 6 years ago after an accident when she was working at [employer]. Since then, she has hand tremors. In 2016 she has been dx'ed with BrCA and also had a total hysterectomy and a pelvic repair surgery.   She likes to work in her garden.  Husband dx'ed with colon cancer - had colectomy at the beginning of 2020, and he had 2 AMIs Spring 2019.  ROS: Constitutional: no weight gain/no weight loss, no fatigue, no subjective hyperthermia, no subjective hypothermia Eyes: no blurry vision, no xerophthalmia ENT: no sore throat, no nodules palpated in neck, no dysphagia, no odynophagia, no hoarseness Cardiovascular: no CP/no SOB/no palpitations/no leg swelling Respiratory: no cough/no SOB/no wheezing Gastrointestinal: no N/no V/no D/no C/no acid reflux Musculoskeletal: no muscle aches/no joint aches Skin: no rashes, no  hair loss Neurological: no tremors/no numbness/no tingling/no dizziness  I reviewed pt's medications, allergies, PMH, social hx, family hx, and changes were documented in the history of present illness. Otherwise, unchanged from my initial visit note.  Past Medical History:  Diagnosis Date  . Arthritis   . Cancer ([place])    left breast  . Chronic pain    from fall 2012  . Diabetes mellitus without complication (HCC)    diet controlled  . History of closed head injury  2012   accident work threw her 58f  . Hyperlipemia   . Personal history of radiation therapy   . Spinal stenosis   . Urinary incontinence    wears a pessery   Past Surgical History:  Procedure Laterality Date  . ABDOMINAL HYSTERECTOMY    . BREAST LUMPECTOMY  1999   left  . BREAST LUMPECTOMY Left 2016  . BREAST LUMPECTOMY WITH RADIOACTIVE SEED LOCALIZATION Left 03/08/2018   Procedure: LEFT BREAST LUMPECTOMY WITH RADIOACTIVE SEED LOCALIZATION;  Surgeon: HExcell Seltzer MD;  [facility]  Service: General;  Laterality: Left;  . CATARACT EXTRACTION W/PHACO Left 10/08/2019   Procedure: CATARACT EXTRACTION PHACO AND INTRAOCULAR LENS PLACEMENT (IOC) LEFT 3.47, 00:46.0,  18.8%;  Surgeon: BLeandrew Koyanagi MD;  [facility]  Service: Ophthalmology;  Laterality: Left;  Diabetes - oral meds  . COLONOSCOPY    . COLONOSCOPY WITH PROPOFOL N/A 05/14/2018   Procedure: COLONOSCOPY WITH PROPOFOL;  Surgeon: AJonathon Bellows MD;  [facility]  Service: Gastroenterology;  Laterality: N/A;  . ORIF WRIST FRACTURE Right 06/15/2017   Procedure: OPEN REDUCTION INTERNAL FIXATION (ORIF) WRIST FRACTURE WITH REDUCTION AND PINNINGT OF CARPAL DISLOCATION;  Surgeon: TMilly Jakob MD;  [facility]  Service: Orthopedics;  Laterality: Right;  . RADIOACTIVE SEED GUIDED PARTIAL MASTECTOMY WITH AXILLARY SENTINEL LYMPH NODE BIOPSY Left 02/08/2015   Procedure: RADIOACTIVE SEED LOCALIZATION LUMPECTOMY WITH LEFT AXILLARY SENTINEL LYMPH NODE BIOPSY;  Surgeon: BExcell Seltzer MD;  [facility]  Service: General;  Laterality: Left;  . SHOULDER ARTHROSCOPY  2012   left  . TONSILLECTOMY     Social History   Social History  . Marital status: Married    Spouse name: N/A  . Number of children: 3   Occupational History  . retired   Social History Main Topics  . Smoking status: Never Smoker  . Smokeless tobacco: Never Used  . Alcohol use No   . Drug use: No   Current Outpatient Medications on File Prior to Visit  Medication Sig Dispense Refill  . Biotin 1000 MCG tablet Take 1,000 mcg by mouth daily.    . Blood Glucose Monitoring Suppl (ONE TOUCH ULTRA 2) w/Device KIT USE AS DIRECTED    . cetirizine (ZYRTEC) 10 MG tablet Take 10 mg by mouth.    . Cholecalciferol (VITAMIN D) 2000 units tablet Take by mouth.    . diphenhydrAMINE (BENADRYL) 25 MG tablet Take 25 mg by mouth 2 (two) times daily.    . Garlic 16045MG CAPS Take by mouth.    .Marland Kitchenglucosamine-chondroitin 500-400 MG tablet Take 1 tablet by mouth daily.    . Lancets (ONETOUCH DELICA PLUS [id number] MISC USE TO CHECK SUGAR DAILY 100 each 3  . metFORMIN (GLUCOPHAGE-XR) 500 MG 24 hr tablet TAKE 1 TABLET BY MOUTH TWO  TIMES DAILY 180 tablet 3  . Multiple Vitamins-Minerals (MULTIVITAMIN WOMEN 50+ PO) Take 1 tablet by mouth daily.    . Omega-3 1000 MG CAPS  Take by mouth.    Glory Rosebush ULTRA test strip USE ONCE DAILY. 100 each 11  . tamoxifen (NOLVADEX) 20 MG tablet Take 1 tablet (20 mg total) by mouth daily. 90 tablet 4  . vitamin B-12 (CYANOCOBALAMIN) 1000 MCG tablet Take 1,000 mcg by mouth daily. dissovable     No current facility-administered medications on file prior to visit.   Allergies  Allergen Reactions  . Oxycontin [Oxycodone Hcl] Shortness Of Breath  . Sulfa Antibiotics Itching   Family history: - DM in sister, MGM.  PE: BP 126/78   Pulse 68   Ht 5' 6.75" (1.695 m)   Wt 181 lb (82.1 kg)   SpO2 97%   BMI 28.56 kg/m  Wt Readings from Last 3 Encounters:  11/24/19 181 lb (82.1 kg)  10/08/19 179 lb (81.2 kg)  05/22/19 175 lb (79.4 kg)   Constitutional: Slightly overweight, in NAD Eyes: PERRLA, EOMI, no exophthalmos ENT: moist mucous membranes, no thyromegaly, no cervical lymphadenopathy Cardiovascular: RRR, No MRG Respiratory: CTA B Gastrointestinal: abdomen soft, NT, ND, BS+ Musculoskeletal: no deformities, strength intact in all 4 Skin: moist, warm,  no rashes Neurological: no tremor with outstretched hands, DTR normal in all 4  ASSESSMENT: 1. DM2, non-insulin-dependent, controlled, without long-term complications  2. HL  3. PN  PLAN:  1. Pt with well-controlled type 2 diabetes, on lowest dose of Metformin ER.  Sugars are usually well controlled except for the times when she has intraspinal steroid or injections, 6 months apart.  At last visit, sugars were excellent and we decreased her Metformin dose to only 1 tablet a day with dinner as she was having gas and bloating after Metformin in the morning. - her sugars are wonderful per review of her logs >> no lows or highs   - no need to change her regimen for now - refilled metformin - I suggested to:  Patient Instructions  Please continue Metformin ER 500 mg with dinner.  Please return in 6 months with your sugar log.  - we checked her HbA1c: 6.1% (slightly higher) - advised to check sugars at different times of the day - 1x a day, rotating check times - advised for yearly eye exams >> she is UTD - return to clinic in 6 months  2. HL -Reviewed latest lipid panel from 05/2019: All fractions at goal: Lab Results  Component Value Date   CHOL 146 05/22/2019   HDL 50.10 05/22/2019   LDLCALC 85 05/22/2019   TRIG 55.0 05/22/2019   CHOLHDL 3 05/22/2019  -She is not on a statin but takes omega-3 fatty acids and garlic  3. PN -Previously on alpha-lipoic acid and B complex, now off -Also she was on B12 injections but stopped -her numbness and tingling are almost resolved  Philemon Kingdom, MD PhD [facility] Endocrinology

## 2019-11-24 NOTE — Addendum Note (Signed)
Addended by: Cardell Peach I on: 11/24/2019 09:02 AM   Modules accepted: Orders

## 2019-11-24 NOTE — Patient Instructions (Signed)
Please continue Metformin ER 500 mg with dinner  Please return in 6 months with your sugar log

## 2019-12-15 ENCOUNTER — Ambulatory Visit: Payer: Medicare Other

## 2020-03-18 ENCOUNTER — Other Ambulatory Visit: Payer: Self-pay | Admitting: Oncology

## 2020-03-18 DIAGNOSIS — Z9889 Other specified postprocedural states: Secondary | ICD-10-CM

## 2020-03-18 DIAGNOSIS — Z853 Personal history of malignant neoplasm of breast: Secondary | ICD-10-CM

## 2020-03-29 DIAGNOSIS — M545 Low back pain: Secondary | ICD-10-CM | POA: Diagnosis not present

## 2020-03-29 DIAGNOSIS — E569 Vitamin deficiency, unspecified: Secondary | ICD-10-CM | POA: Diagnosis not present

## 2020-03-29 DIAGNOSIS — M79605 Pain in left leg: Secondary | ICD-10-CM | POA: Diagnosis not present

## 2020-03-29 DIAGNOSIS — E559 Vitamin D deficiency, unspecified: Secondary | ICD-10-CM | POA: Diagnosis not present

## 2020-03-29 DIAGNOSIS — Z79899 Other long term (current) drug therapy: Secondary | ICD-10-CM | POA: Diagnosis not present

## 2020-03-29 DIAGNOSIS — G8929 Other chronic pain: Secondary | ICD-10-CM | POA: Diagnosis not present

## 2020-04-12 NOTE — Progress Notes (Signed)
[place]  Telephone:([phone]) [phone] Fax:(336) [phone]    ID: Gabriela [name] DOB: December 23, 1945  MR#: [id number]  ZRA#:[id number]  Patient Care Team: [name], FNP as PCP - General (Nurse Practitioner) Excell Seltzer, MD (Inactive) as Consulting Physician (General Surgery) Philemon Kingdom, MD as Consulting Physician (Internal Medicine) Jonathon Bellows, MD as Consulting Physician (Gastroenterology) Jerianne Anselmo, Virgie Dad, MD as Consulting Physician (Oncology) OTHER MD: Prentiss Bells "[name]" [place], [place]; Berton Mount MD, Dossie Arbour MD   CHIEF COMPLAINT: Estrogen receptor positive breast cancer  CURRENT TREATMENT: Tamoxifen   INTERVAL HISTORY: Gabriela [name] returns today for follow-up of her estrogen receptor positive breast cancer.   She continues on tamoxifen, with good tolerance. She denies having hot flashes or increased vaginal discharge.  She obtains the drug at no cost  Since her last visit, she underwent bilateral diagnostic mammography with tomography at [facility] on 04/28/2019 showing: breast density category B; no evidence of malignancy in either breast. She is scheduled for repeat on 04/28/2020.     REVIEW OF SYSTEMS: Gabriela [name] continues to do quite a bit [place]  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  She had the Pfizer vaccine as did her husband daughters and grandchildren.  A detailed review of systems today was otherwise stable   BREAST CANCER HISTORY: From the original intake note:  The patient herself noted a lump in her left breast, associated with some discomfort. She brought it to her physician's attention and on 01/15/2015 underwent left digital mammography with tomography and ultrasonography at [facility]. This showed breast density to be category A. Compression images were negative but on physical exam there was a firm palpable mobile nodule at the 3:00  position of the left breast. By ultrasound this measured 9 mm maximally. There is no note regarding axillary adenopathy in the mammographic report.  On 01/18/2015 the patient underwent biopsy of the mass in question and this showed ([id number]; [id number]) an invasive mammary carcinoma, measuring 3 mm on the specimen, grade 1, estrogen receptor greater than 90% positive, progesterone receptor between 11 and 50% positive, with no HER-2 amplification  (Immunohistochemistry score 1+).  On 02/03/2015 the patient underwent bilateral breast MRI. This suggested a breast density category B. The right breast was negative and there were no abnormal appearing lymph nodes. In the left breast there was an irregular enhancing mass subareolar early measuring 9 mm.  Her subsequent history is as detailed below   PAST MEDICAL HISTORY: Past Medical History:  Diagnosis Date   Arthritis    Cancer ([place])    left breast   Chronic pain    from fall 2012   Diabetes mellitus without complication ([place])    diet controlled   History of closed head injury 2012   accident work threw her 49f   Hyperlipemia    Personal history of radiation therapy    Spinal stenosis    Urinary incontinence    wears a pessery    PAST SURGICAL HISTORY: Past Surgical History:  Procedure Laterality Date   ABDOMINAL HYSTERECTOMY     BREAST LUMPECTOMY  1999   left   BREAST LUMPECTOMY Left 2016   BREAST LUMPECTOMY WITH RADIOACTIVE SEED LOCALIZATION Left 03/08/2018   Procedure: LEFT BREAST LUMPECTOMY WITH RADIOACTIVE SEED LOCALIZATION;  Surgeon: HExcell Seltzer MD;  [facility]  Service: General;  Laterality: Left;   CATARACT EXTRACTION W/PHACO Left 10/08/2019  Procedure: CATARACT EXTRACTION PHACO AND INTRAOCULAR LENS PLACEMENT (IOC) LEFT 3.47, 00:46.0,  18.8%;  Surgeon: Leandrew Koyanagi, MD;  Location: [place];  Service: Ophthalmology;  Laterality: Left;  Diabetes - oral meds     COLONOSCOPY     COLONOSCOPY WITH PROPOFOL N/A 05/14/2018   Procedure: COLONOSCOPY WITH PROPOFOL;  Surgeon: Jonathon Bellows, MD;  [facility];  Service: Gastroenterology;  Laterality: N/A;   ORIF WRIST FRACTURE Right 06/15/2017   Procedure: OPEN REDUCTION INTERNAL FIXATION (ORIF) WRIST FRACTURE WITH REDUCTION AND PINNINGT OF CARPAL DISLOCATION;  Surgeon: Milly Jakob, MD;  Location: [place];  Service: Orthopedics;  Laterality: Right;   RADIOACTIVE SEED GUIDED PARTIAL MASTECTOMY WITH AXILLARY SENTINEL LYMPH NODE BIOPSY Left 02/08/2015   Procedure: RADIOACTIVE SEED LOCALIZATION LUMPECTOMY WITH LEFT AXILLARY SENTINEL LYMPH NODE BIOPSY;  Surgeon: Excell Seltzer, MD;  Location: [place];  Service: General;  Laterality: Left;   SHOULDER ARTHROSCOPY  2012   left   TONSILLECTOMY      FAMILY HISTORY Family History  Problem Relation Age of Onset   Colon cancer Mother        possibly caused by medication during pregnancy   Breast cancer Maternal Aunt        dx in 14s   Cervical cancer Maternal Aunt        dx in 62s   The patient's father died in an automobile accident at age 36. The patient's mother died at the age of 66 from colon cancer. The patient has been told that her mother was on a medication during her pregnancy that may have caused the colon cancer. The patient's mother had a sister diagnosed with breast and cervical cancer in her 42s. She also had a brother who was killed in World War II. There is no other history of cancer in the family to the patient's knowledge    GYNECOLOGIC HISTORY:  No LMP recorded. Patient has had a hysterectomy. Menarche age 39, first live birth age 10. The patient is GX P3. She went through the change of life approximately age 45. She did not take hormone replacement. She never used birth control    SOCIAL HISTORY:  The patient and her husband Dominica Severin are both retired. They live on 14 acres in our quite busy caring for that. At  is also Javier's chief source of exercise. Daughter Jamesetta So is an occupational Programmer, applications in [place]. Daughter Rolly Salter is a Cabin crew in [place]. Son Mekiah Wahler works as a Librarian, academic for the [employer] in [place]. The patient has 4 grandchildren. She is a Psychologist, forensic.    ADVANCED DIRECTIVES: In place   HEALTH MAINTENANCE: Social History   Tobacco Use   Smoking status: Never Smoker   Smokeless tobacco: Never Used  Substance Use Topics   Alcohol use: No   Drug use: No     Colonoscopy: 05/14/2018, negative for dysplasia  PAP: 2015  Bone density: 05/29/2018, 1.5  Lipid panel:  Allergies  Allergen Reactions   Oxycontin [Oxycodone Hcl] Shortness Of Breath   Sulfa Antibiotics Itching   Current Outpatient Medications  Medication Sig Dispense Refill   Biotin 1000 MCG tablet Take 1,000 mcg by mouth daily.     Blood Glucose Monitoring Suppl (ONE TOUCH ULTRA 2) w/Device KIT USE AS DIRECTED     cetirizine (ZYRTEC) 10 MG tablet Take 10 mg by mouth.     Cholecalciferol (VITAMIN D) 2000 units tablet Take by mouth.     diphenhydrAMINE (BENADRYL) 25  MG tablet Take 25 mg by mouth 2 (two) times daily.     Garlic 9811 MG CAPS Take by mouth.     glucosamine-chondroitin 500-400 MG tablet Take 1 tablet by mouth daily.     Lancets (ONETOUCH DELICA PLUS [id number]) MISC USE TO CHECK SUGAR DAILY 100 each 3   metFORMIN (GLUCOPHAGE-XR) 500 MG 24 hr tablet Take 1 tablet (500 mg total) by mouth daily with supper. 90 tablet 3   Multiple Vitamins-Minerals (MULTIVITAMIN WOMEN 50+ PO) Take 1 tablet by mouth daily.     Omega-3 1000 MG CAPS Take by mouth.     ONETOUCH ULTRA test strip USE ONCE DAILY. 100 each 11   tamoxifen (NOLVADEX) 20 MG tablet Take 1 tablet (20 mg total) by mouth daily. 90 tablet 4   vitamin B-12 (CYANOCOBALAMIN) 1000 MCG tablet Take 1,000 mcg by mouth daily. dissovable     No current facility-administered medications for this visit.     OBJECTIVE: white woman in no acute distress  Vitals:   04/13/20 1043  BP: (!) 122/50  Pulse: (!) 56  Resp: 17  Temp: 98.7 F (37.1 C)  TempSrc: Temporal  SpO2: 100%  Weight: 182 lb 3.2 oz (82.6 kg)  Height: 5' 6.75" (1.695 m)    Sclerae unicteric, EOMs intact Wearing a mask No cervical or supraclavicular adenopathy Lungs no rales or rhonchi Heart regular rate and rhythm Abd soft, nontender, positive bowel sounds MSK no focal spinal tenderness, no upper extremity lymphedema Neuro: nonfocal, well oriented, appropriate affect Breasts: Right breast is unremarkable.  The left breast is status post lumpectomy and radiation with no evidence of local recurrence.  Both axillae are benign.   LAB RESULTS:  CMP     Component Value Date/Time   NA 140 04/14/2019 1052   NA 138 02/20/2017 1149   K 5.0 04/14/2019 1052   K 4.7 02/20/2017 1149   CL 103 04/14/2019 1052   CO2 29 04/14/2019 1052   CO2 27 02/20/2017 1149   GLUCOSE 125 (H) 04/14/2019 1052   GLUCOSE 192 (H) 02/20/2017 1149   BUN 14 04/14/2019 1052   BUN 14.7 02/20/2017 1149   CREATININE 0.83 04/14/2019 1052   CREATININE 0.84 04/15/2018 1432   CREATININE 0.9 02/20/2017 1149   CALCIUM 9.3 04/14/2019 1052   CALCIUM 9.5 02/20/2017 1149   PROT 6.3 (L) 04/14/2019 1052   PROT 6.6 02/20/2017 1149   ALBUMIN 3.8 04/14/2019 1052   ALBUMIN 3.9 02/20/2017 1149   AST 21 04/14/2019 1052   AST 19 04/15/2018 1432   AST 16 02/20/2017 1149   ALT 15 04/14/2019 1052   ALT 11 04/15/2018 1432   ALT 12 02/20/2017 1149   ALKPHOS 20 (L) 04/14/2019 1052   ALKPHOS 19 (L) 02/20/2017 1149   BILITOT 0.3 04/14/2019 1052   BILITOT 0.2 04/15/2018 1432   BILITOT 0.60 02/20/2017 1149   GFRNONAA >60 04/14/2019 1052   GFRNONAA >60 04/15/2018 1432   GFRAA >60 04/14/2019 1052   GFRAA >60 04/15/2018 1432    No results found for: TOTALPROTELP, ALBUMINELP, A1GS, A2GS, BETS, BETA2SER, GAMS, MSPIKE, SPEI  Lab Results  Component Value Date    WBC 4.9 04/13/2020   NEUTROABS 3.2 04/13/2020   HGB 12.6 04/13/2020   HCT 39.3 04/13/2020   MCV 92.9 04/13/2020   PLT 182 04/13/2020    No results found for: LABCA2  No components found for: [id number]  No results for input(s): INR in the last 168 hours.  No results found for: LABCA2  No results found for: YOM600  No results found for: KHT977  No results found for: SFS239  No results found for: CA2729  No components found for: HGQUANT  No results found for: CEA1 / No results found for: CEA1   No results found for: AFPTUMOR  No results found for: CHROMOGRNA  No results found for: KPAFRELGTCHN, LAMBDASER, KAPLAMBRATIO (kappa/lambda light chains)  No results found for: HGBA, HGBA2QUANT, HGBFQUANT, HGBSQUAN (Hemoglobinopathy evaluation)   No results found for: LDH  No results found for: IRON, TIBC, IRONPCTSAT (Iron and TIBC)  No results found for: FERRITIN  Urinalysis No results found for: COLORURINE, APPEARANCEUR, LABSPEC, PHURINE, GLUCOSEU, HGBUR, BILIRUBINUR, KETONESUR, PROTEINUR, UROBILINOGEN, NITRITE, LEUKOCYTESUR   STUDIES: No results found.   ASSESSMENT: 74 y.o. [place] woman status post left breast upper outer quadrant biopsy 01/18/2015 for a clinical T1b N0, stage IA invasive ductal carcinoma, grade 1, estrogen and progesterone receptor positive, HER-2 not amplified  (1) left lumpectomy and sentinel lymph node sampling 02/08/2015 showed a pT1b pN0, stage IA invasive ductal carcinoma, grade 1, with repeat HER-2 again negative. Margins were negative but close  (2) Oncotype DX showed a score of 14, predicting a risk of recurrence outside the breast in the next 10 years of 9% if the patient's only systemic treatment is tamoxifen for 5 years. It also protects no benefit from adjuvant chemotherapy.  (3) adjuvant radiation completed in [facility] 05/11/2015  (4) tamoxifen started 05/20/2015  (a) bone density 05/29/2018 normal with a T score of 1.5  (5)  TAH-BSO in December 2016. Benign pathology.  (6) left breast upper outer quadrant biopsy 03/08/2018 with no evidence of malignancy   PLAN: Gabriela [name] is now a little over 5 years out from definitive surgery for her breast cancer.  There is no evidence of disease recurrence.  This is very favorable.  She is completing 5 years of tamoxifen.  I gave her the option of "graduating".  However she has no side effects from the medication and obtains it for free.  She would like to continue to a total of 10 years.  This will decrease the risk of breast cancer minimally but it will help bone health and in the absence of any symptoms I think that is a good decision  Accordingly she will return to see me July of next year, after her next mammogram  She knows to call for any other issue that may develop before then  Total encounter time 20 minutes.*  Klee Kolek, Virgie Dad, MD  04/13/20 11:00 AM Medical Oncology and Hematology [facility] [place], [place] [address] Tel. [phone]    Fax. [phone]   I, Wilburn Mylar, am acting as scribe for Dr. Virgie Dad. Christepher Melchior.  I, Lurline Del MD, have reviewed the above documentation for accuracy and completeness, and I agree with the above.   *Total Encounter Time as defined by the Centers for Medicare and Medicaid Services includes, in addition to the face-to-face time of a patient visit (documented in the note above) non-face-to-face time: obtaining and reviewing outside history, ordering and reviewing medications, tests or procedures, care coordination (communications with other health care professionals or caregivers) and documentation in the medical record.

## 2020-04-13 ENCOUNTER — Inpatient Hospital Stay: Payer: Medicare Other

## 2020-04-13 ENCOUNTER — Telehealth: Payer: Self-pay | Admitting: Oncology

## 2020-04-13 ENCOUNTER — Inpatient Hospital Stay: Payer: Medicare Other | Attending: Oncology | Admitting: Oncology

## 2020-04-13 ENCOUNTER — Other Ambulatory Visit: Payer: Self-pay

## 2020-04-13 VITALS — BP 122/50 | HR 56 | Temp 98.7°F | Resp 17 | Ht 66.75 in | Wt 182.2 lb

## 2020-04-13 DIAGNOSIS — E559 Vitamin D deficiency, unspecified: Secondary | ICD-10-CM | POA: Diagnosis not present

## 2020-04-13 DIAGNOSIS — Z79810 Long term (current) use of selective estrogen receptor modulators (SERMs): Secondary | ICD-10-CM | POA: Diagnosis not present

## 2020-04-13 DIAGNOSIS — Z923 Personal history of irradiation: Secondary | ICD-10-CM | POA: Diagnosis not present

## 2020-04-13 DIAGNOSIS — C50412 Malignant neoplasm of upper-outer quadrant of left female breast: Secondary | ICD-10-CM

## 2020-04-13 DIAGNOSIS — Z170 Estrogen receptor positive status [ER+]: Secondary | ICD-10-CM

## 2020-04-13 LAB — COMPREHENSIVE METABOLIC PANEL
ALT: 14 U/L (ref 0–44)
AST: 20 U/L (ref 15–41)
Albumin: 3.9 g/dL (ref 3.5–5.0)
Alkaline Phosphatase: 22 U/L — ABNORMAL LOW (ref 38–126)
Anion gap: 9 (ref 5–15)
BUN: 19 mg/dL (ref 8–23)
CO2: 27 mmol/L (ref 22–32)
Calcium: 8.9 mg/dL (ref 8.9–10.3)
Chloride: 104 mmol/L (ref 98–111)
Creatinine, Ser: 0.92 mg/dL (ref 0.44–1.00)
GFR calc Af Amer: >60 mL/min (ref >60)
GFR calc non Af Amer: >60 mL/min (ref >60)
Glucose, Bld: 109 mg/dL — ABNORMAL HIGH (ref 70–99)
Potassium: 4.1 mmol/L (ref 3.5–5.1)
Sodium: 140 mmol/L (ref 135–145)
Total Bilirubin: 0.5 mg/dL (ref 0.3–1.2)
Total Protein: 6.7 g/dL (ref 6.5–8.1)

## 2020-04-13 LAB — CBC WITH DIFFERENTIAL/PLATELET
Abs Immature Granulocytes: 0.01 10*3/uL (ref 0.00–0.07)
Basophils Absolute: 0.1 10*3/uL (ref 0.0–0.1)
Basophils Relative: 1 %
Eosinophils Absolute: 0.1 10*3/uL (ref 0.0–0.5)
Eosinophils Relative: 2 %
HCT: 39.3 % (ref 36.0–46.0)
Hemoglobin: 12.6 g/dL (ref 12.0–15.0)
Immature Granulocytes: 0 %
Lymphocytes Relative: 22 %
Lymphs Abs: 1.1 10*3/uL (ref 0.7–4.0)
MCH: 29.8 pg (ref 26.0–34.0)
MCHC: 32.1 g/dL (ref 30.0–36.0)
MCV: 92.9 fL (ref 80.0–100.0)
Monocytes Absolute: 0.5 10*3/uL (ref 0.1–1.0)
Monocytes Relative: 10 %
Neutro Abs: 3.2 10*3/uL (ref 1.7–7.7)
Neutrophils Relative %: 65 %
Platelets: 182 10*3/uL (ref 150–400)
RBC: 4.23 MIL/uL (ref 3.87–5.11)
RDW: 12.6 % (ref 11.5–15.5)
WBC: 4.9 10*3/uL (ref 4.0–10.5)
nRBC: 0 % (ref 0.0–0.2)

## 2020-04-13 MED ORDER — TAMOXIFEN CITRATE 20 MG PO TABS: 20.0000 mg | ORAL_TABLET | Freq: Every day | ORAL | 4 refills | Status: DC

## 2020-04-13 NOTE — Telephone Encounter (Signed)
Scheduled appts per 6/8 los. Gave pt a print out of AVS.

## 2020-04-26 DIAGNOSIS — M1712 Unilateral primary osteoarthritis, left knee: Secondary | ICD-10-CM | POA: Diagnosis not present

## 2020-04-26 DIAGNOSIS — M25562 Pain in left knee: Secondary | ICD-10-CM | POA: Diagnosis not present

## 2020-04-28 ENCOUNTER — Other Ambulatory Visit: Payer: Self-pay

## 2020-04-28 ENCOUNTER — Other Ambulatory Visit: Payer: Self-pay | Admitting: Oncology

## 2020-04-28 ENCOUNTER — Ambulatory Visit
Admission: RE | Admit: 2020-04-28 | Discharge: 2020-04-28 | Disposition: A | Discharge status: Home / Self Care | Payer: Medicare Other | Source: Ambulatory Visit | Attending: Oncology | Admitting: Oncology

## 2020-04-28 DIAGNOSIS — Z9889 Other specified postprocedural states: Secondary | ICD-10-CM

## 2020-04-28 DIAGNOSIS — Z853 Personal history of malignant neoplasm of breast: Secondary | ICD-10-CM

## 2020-04-28 DIAGNOSIS — R928 Other abnormal and inconclusive findings on diagnostic imaging of breast: Secondary | ICD-10-CM | POA: Diagnosis not present

## 2020-04-28 DIAGNOSIS — R921 Mammographic calcification found on diagnostic imaging of breast: Secondary | ICD-10-CM

## 2020-05-17 ENCOUNTER — Ambulatory Visit: Payer: Medicare Other

## 2020-05-19 ENCOUNTER — Encounter: Payer: Medicare Other | Admitting: Family Medicine

## 2020-05-21 ENCOUNTER — Other Ambulatory Visit: Payer: Self-pay

## 2020-05-21 ENCOUNTER — Ambulatory Visit (INDEPENDENT_AMBULATORY_CARE_PROVIDER_SITE_OTHER): Payer: Medicare Other | Admitting: Family Medicine

## 2020-05-21 VITALS — BP 118/78 | HR 78 | Temp 97.6°F | Ht 66.5 in | Wt 175.8 lb

## 2020-05-21 DIAGNOSIS — Z0000 Encounter for general adult medical examination without abnormal findings: Secondary | ICD-10-CM | POA: Diagnosis not present

## 2020-05-21 DIAGNOSIS — E119 Type 2 diabetes mellitus without complications: Secondary | ICD-10-CM | POA: Diagnosis not present

## 2020-05-21 DIAGNOSIS — E785 Hyperlipidemia, unspecified: Secondary | ICD-10-CM

## 2020-05-21 DIAGNOSIS — E2839 Other primary ovarian failure: Secondary | ICD-10-CM

## 2020-05-21 NOTE — Progress Notes (Signed)
Subjective:    Patient ID: Gabriela [name], female    DOB: 02-11-1946, 74 y.o.   MRN: [id number]  HPI Chief Complaint  Patient presents with  . Annual Exam   This is a 74 yo female who presents today for annual exam. She has been doing well. Husband finished his cancer treatments and was most recently declared cancer free. The patient has been able to see her family and work in her garden. She has no concerns today.   Last CPE- 05/16/2019 Mammo- 04/28/2020 Pap- total hysterectomy Colonoscopy- 05/14/2018 Tdap-06/15/2017 Covid- fully vaccinated Flu- annual Eye-UTD Dental- regular Exercise- active in garden, household chores  Left knee pain- has seen Dr. Rudene Christians, diagnosed with osteoarthritis, some improvement with cortisone shot, is waiting on her brace.   DM type 2- good control. Follows with Dr. Letta Median.   Hyperlipidemia- per endocrine, no statin on omega 3, garlic  Breast cancer history- continues on tamoxifen, annual oncology follow up, UTD on mammogram Review of Systems  Constitutional: Negative.   HENT: Negative.   Eyes: Negative.   Respiratory: Negative.   Cardiovascular: Negative.   Gastrointestinal: Negative.   Endocrine: Negative.   Genitourinary: Negative.   Musculoskeletal:       Knee pain per HPI.   Skin: Negative.   Allergic/Immunologic: Negative.   Neurological: Negative.   Hematological: Negative.   Psychiatric/Behavioral: Negative.        Objective:   Physical Exam Vitals reviewed.  Constitutional:      General: She is not in acute distress.    Appearance: Normal appearance. She is normal weight. She is not ill-appearing, toxic-appearing or diaphoretic.  HENT:     Head: Normocephalic and atraumatic.     Right Ear: Tympanic membrane, ear canal and external ear normal.     Left Ear: Tympanic membrane, ear canal and external ear normal.     Nose: Nose normal.     Mouth/Throat:     Mouth: Mucous membranes are moist.     Pharynx: Oropharynx is clear.    Eyes:     Conjunctiva/sclera: Conjunctivae normal.  Cardiovascular:     Rate and Rhythm: Normal rate and regular rhythm.     Heart sounds: Normal heart sounds.  Pulmonary:     Effort: Pulmonary effort is normal.     Breath sounds: Normal breath sounds.  Abdominal:     General: Abdomen is flat. Bowel sounds are normal.     Palpations: Abdomen is soft.  Musculoskeletal:     Cervical back: Normal range of motion and neck supple.     Right lower leg: No edema.     Left lower leg: No edema.  Skin:    General: Skin is warm and dry.  Neurological:     Mental Status: She is alert and oriented to person, place, and time.  Psychiatric:        Mood and Affect: Mood normal.        Behavior: Behavior normal.        Thought Content: Thought content normal.        Judgment: Judgment normal.       BP 118/78   Pulse 78   Temp 97.6 F (36.4 C) (Temporal)   Ht 5' 6.5" (1.689 m)   Wt 175 lb 12 oz (79.7 kg)   SpO2 98%   BMI 27.94 kg/m  Wt Readings from Last 3 Encounters:  05/21/20 175 lb 12 oz (79.7 kg)  04/13/20 182 lb 3.2 oz (82.6 kg)  11/24/19 181 lb (82.1 kg)   Depression screen [facility] 2/9 05/21/2020 04/10/2019 04/17/2018 04/08/2018 12/03/2015  Decreased Interest 0 0 0 0 0  Down, Depressed, Hopeless 0 0 0 0 0  PHQ - 2 Score 0 0 0 0 0  Altered sleeping 0 0 - 0 -  Tired, decreased energy 0 0 - 0 -  Change in appetite 0 0 - 0 -  Feeling bad or failure about yourself  0 0 - 0 -  Trouble concentrating 0 0 - 0 -  Moving slowly or fidgety/restless 0 0 - 0 -  Suicidal thoughts 0 0 - 0 -  PHQ-9 Score 0 0 - 0 -  Difficult doing work/chores - Not difficult at all - Not difficult at all -       Assessment & Plan:  1. Annual physical exam - Discussed and encouraged healthy lifestyle choices- adequate sleep, regular exercise, stress management and healthy food choices.    2. Diabetes mellitus type II, non insulin dependent ([place]) - well controlled, followed by endocrinology  3.  Hyperlipidemia, unspecified hyperlipidemia type - per endocrine, ok not to be on statin, on omega 3, garlic  - follow up in 6 months  This visit occurred during the SARS-CoV-2 public health emergency.  Safety protocols were in place, including screening questions prior to the visit, additional usage of staff PPE, and extensive cleaning of exam room while observing appropriate contact time as indicated for disinfecting solutions.      Clarene Reamer, FNP-BC  [facility]  05/25/2020 6:32 AM

## 2020-05-21 NOTE — Patient Instructions (Signed)
Good to see you today  Please follow up in 6 months, sooner if you need anything

## 2020-05-24 ENCOUNTER — Other Ambulatory Visit: Payer: Self-pay

## 2020-05-24 ENCOUNTER — Ambulatory Visit (INDEPENDENT_AMBULATORY_CARE_PROVIDER_SITE_OTHER): Payer: Medicare Other | Admitting: Internal Medicine

## 2020-05-24 ENCOUNTER — Encounter: Payer: Self-pay | Admitting: Internal Medicine

## 2020-05-24 VITALS — BP 118/60 | HR 58 | Ht 66.5 in | Wt 179.0 lb

## 2020-05-24 DIAGNOSIS — E785 Hyperlipidemia, unspecified: Secondary | ICD-10-CM | POA: Diagnosis not present

## 2020-05-24 DIAGNOSIS — E119 Type 2 diabetes mellitus without complications: Secondary | ICD-10-CM | POA: Diagnosis not present

## 2020-05-24 DIAGNOSIS — G629 Polyneuropathy, unspecified: Secondary | ICD-10-CM

## 2020-05-24 LAB — LIPID PANEL
Cholesterol: 148 mg/dL (ref 0–200)
HDL: 44.4 mg/dL (ref >39.00)
LDL Cholesterol: 85 mg/dL (ref 0–99)
NonHDL: 103.68
Total CHOL/HDL Ratio: 3
Triglycerides: 94 mg/dL (ref 0.0–149.0)
VLDL: 18.8 mg/dL (ref 0.0–40.0)

## 2020-05-24 LAB — POCT GLYCOSYLATED HEMOGLOBIN (HGB A1C): Hemoglobin A1C: 6.2 % — AB (ref 4.0–5.6)

## 2020-05-24 NOTE — Progress Notes (Addendum)
Patient ID: Gabriela [name], female   DOB: [birth year], 74 y.o.   MRN: [id number]   This visit occurred during the SARS-CoV-2 public health emergency.  Safety protocols were in place, including screening questions prior to the visit, additional usage of staff PPE, and extensive cleaning of exam room while observing appropriate contact time as indicated for disinfecting solutions.   HPI: Gabriela [name] is a 74 y.o.-year-old female, returning for f/u for DM2, dx in 04/2014, non-insulin-dependent, controlled, without long-term complications. Last visit 6 months ago.  She has L knee pain an tyrning >> had a steroid inj in 04/2020.  Reviewed HbA1c levels: Lab Results  Component Value Date   HGBA1C 6.1 (A) 11/24/2019   HGBA1C 5.9 (A) 05/22/2019   HGBA1C 5.9 (A) 11/19/2018   HGBA1C 6.4 04/08/2018   HGBA1C 6 11/16/2017   HGBA1C 5.9 05/16/2017   HGBA1C 5.8 12/27/2016  08/08/2016: HbA1c 7.0% 02/04/2016: HbA1c 6.7% 05/12/2015: HbA1c 6.3% 11/17/2014: HbA1c 6.1% 08/07/2014: HbA1c 6.4%  Pt is on: - Metformin ER 500 mg 2x a day >> 500 mg with dinner that she had increased gas when she was taking it in the morning She could not tolerate Metformin IR: HA, reflux, borborygmi  Pt checks her sugars once a day: - am: 130s-160, 170 >> n/c  - 2h after b'fast: 87-120 >> 100-110 >> 93-110 >> 108-118, 132, 148 (steroid inj) - before lunch: n/c  - 2h after lunch: 88-114 >> 90-112 >> 94-118 >> 109-125, 151 (steroid inj) - before dinner: n/c - 2h after dinner: 90-126 >> 93-115 >> 86-121 >> 107-128 - bedtime: n/c - nighttime: n/c Lowest sugar was 87 >> 90 >> 86 >> 107; it is unclear at which level she has hypoglycemia awareness. Highest sugar was 201 (steroid inj) >> 126 >> 115 >> 121 >> 151.  Glucometer: One Touch Ultra  Pt's meals are: - Breakfast: multi-grain sandwich with egg salad - Lunch: may skip; multi-grain crackers with salad or homemade pimento cheese - Dinner: grilled or broiled meat and  vegetable - Snacks: apple/pear; almonds/cashews  No CKD, last BUN/creatinine:  Lab Results  Component Value Date   BUN 19 04/13/2020   BUN 14 04/14/2019   CREATININE 0.92 04/13/2020   CREATININE 0.83 04/14/2019   + HL;  last set of lipids: Lab Results  Component Value Date   CHOL 146 05/22/2019   HDL 50.10 05/22/2019   LDLCALC 85 05/22/2019   TRIG 55.0 05/22/2019   CHOLHDL 3 05/22/2019  02/04/2016: 181/84/43/121 She is not on a statin.  She takes omega-3 fatty acids 1200 mg twice a day and also garlic. - last eye exam was 02/2019: No DR, + cataract.  She had surgery. -She has improved numbness and tingling in her feet.  Previously on alpha-lipoic acid and B complex.  She was on B12 injections, now on p.o. B12  She has a history of subdural hematoma 6 years ago after an accident when she was working at [employer]. Since then, she has hand tremors. In 2016 she has been dx'ed with BrCA and also had a total hysterectomy and a pelvic repair surgery.   She likes to work in her garden.  Gabriela [name] dx'ed with colon cancer - had colectomy at the beginning of 2020, and he had 2 AMIs Spring 2019.  ROS: Constitutional: no weight gain/no weight loss, no fatigue, no subjective hyperthermia, no subjective hypothermia Eyes: no blurry vision, no xerophthalmia ENT: no sore throat, no nodules palpated in neck, no dysphagia, no odynophagia, no  hoarseness Cardiovascular: no CP/no SOB/no palpitations/no leg swelling Respiratory: no cough/no SOB/no wheezing Gastrointestinal: no N/no V/no D/no C/no acid reflux Musculoskeletal: no muscle aches/+ L knee pain  Skin: no rashes, no hair loss Neurological: no tremors/no numbness/no tingling/no dizziness  I reviewed pt's medications, allergies, PMH, social hx, family hx, and changes were documented in the history of present illness. Otherwise, unchanged from my initial visit note.  Past Medical History:  Diagnosis Date  . Arthritis   . Cancer ([place])    left  breast  . Chronic pain    from fall 2012  . Diabetes mellitus without complication (HCC)    diet controlled  . History of closed head injury 2012   accident work threw her 48f  . Hyperlipemia   . Personal history of radiation therapy   . Spinal stenosis   . Urinary incontinence    wears a pessery   Past Surgical History:  Procedure Laterality Date  . ABDOMINAL HYSTERECTOMY    . BREAST LUMPECTOMY  1999   left  . BREAST LUMPECTOMY Left 2016  . BREAST LUMPECTOMY WITH RADIOACTIVE SEED LOCALIZATION Left 03/08/2018   Procedure: LEFT BREAST LUMPECTOMY WITH RADIOACTIVE SEED LOCALIZATION;  Surgeon: HExcell Seltzer MD;  [facility] [facility]  Service: General;  Laterality: Left;  . CATARACT EXTRACTION W/PHACO Left 10/08/2019   Procedure: CATARACT EXTRACTION PHACO AND INTRAOCULAR LENS PLACEMENT (IOC) LEFT 3.47, 00:46.0,  18.8%;  Surgeon: BLeandrew Koyanagi MD;  [place] [facility]  Service: Ophthalmology;  Laterality: Left;  Diabetes - oral meds  . COLONOSCOPY    . COLONOSCOPY WITH PROPOFOL N/A 05/14/2018   Procedure: COLONOSCOPY WITH PROPOFOL;  Surgeon: AJonathon Bellows MD;  [facility]  Service: Gastroenterology;  Laterality: N/A;  . ORIF WRIST FRACTURE Right 06/15/2017   Procedure: OPEN REDUCTION INTERNAL FIXATION (ORIF) WRIST FRACTURE WITH REDUCTION AND PINNINGT OF CARPAL DISLOCATION;  Surgeon: TMilly Jakob MD;  [place] [facility]  Service: Orthopedics;  Laterality: Right;  . RADIOACTIVE SEED GUIDED PARTIAL MASTECTOMY WITH AXILLARY SENTINEL LYMPH NODE BIOPSY Left 02/08/2015   Procedure: RADIOACTIVE SEED LOCALIZATION LUMPECTOMY WITH LEFT AXILLARY SENTINEL LYMPH NODE BIOPSY;  Surgeon: BExcell Seltzer MD;  [place] [facility]  Service: General;  Laterality: Left;  . SHOULDER ARTHROSCOPY  2012   left  . TONSILLECTOMY     Social History   Social History  . Marital status: Married    Spouse name: N/A  . Number of children: 3    Occupational History  . retired   Social History Main Topics  . Smoking status: Never Smoker  . Smokeless tobacco: Never Used  . Alcohol use No  . Drug use: No   Current Outpatient Medications on File Prior to Visit  Medication Sig Dispense Refill  . Biotin 1000 MCG tablet Take 1,000 mcg by mouth daily.    . Blood Glucose Monitoring Suppl (ONE TOUCH ULTRA 2) w/Device KIT USE AS DIRECTED    . cetirizine (ZYRTEC) 10 MG tablet Take 10 mg by mouth.    . Cholecalciferol (VITAMIN D) 2000 units tablet Take by mouth.    . diphenhydrAMINE (BENADRYL) 25 MG tablet Take 25 mg by mouth 2 (two) times daily.    . Garlic 18144MG CAPS Take by mouth.    .Marland Kitchenglucosamine-chondroitin 500-400 MG tablet Take 1 tablet by mouth daily.    . Lancets (ONETOUCH DELICA PLUS LYJEHUD14H MISC USE TO CHECK SUGAR DAILY 100 each 3  . metFORMIN (GLUCOPHAGE-XR) 500 MG 24 hr tablet Take  1 tablet (500 mg total) by mouth daily with supper. 90 tablet 3  . Multiple Vitamins-Minerals (MULTIVITAMIN WOMEN 50+ PO) Take 1 tablet by mouth daily.    . Omega-3 1000 MG CAPS Take by mouth.    Glory Rosebush ULTRA test strip USE ONCE DAILY. 100 each 11  . tamoxifen (NOLVADEX) 20 MG tablet Take 1 tablet (20 mg total) by mouth daily. 90 tablet 4  . vitamin B-12 (CYANOCOBALAMIN) 1000 MCG tablet Take 1,000 mcg by mouth daily. dissovable     No current facility-administered medications on file prior to visit.   Allergies  Allergen Reactions  . Oxycontin [Oxycodone Hcl] Shortness Of Breath  . Sulfa Antibiotics Itching   Family history: - DM in sister, MGM.  PE: BP 118/60   Pulse (!) 58   Ht 5' 6.5" (1.689 m)   Wt 179 lb (81.2 kg)   SpO2 99%   BMI 28.46 kg/m  Wt Readings from Last 3 Encounters:  05/24/20 179 lb (81.2 kg)  05/21/20 175 lb 12 oz (79.7 kg)  04/13/20 182 lb 3.2 oz (82.6 kg)   Constitutional: overweight, in NAD Eyes: PERRLA, EOMI, no exophthalmos ENT: moist mucous membranes, no thyromegaly, no cervical  lymphadenopathy Cardiovascular: RRR, No MRG Respiratory: CTA B Gastrointestinal: abdomen soft, NT, ND, BS+ Musculoskeletal: no deformities, strength intact in all 4 Skin: moist, warm, no rashes Neurological: no tremor with outstretched hands, DTR normal in all 4  ASSESSMENT: 1. DM2, non-insulin-dependent, controlled, without long-term complications  2. HL  3. PN  PLAN:  1. Pt with well-controlled type 2 diabetes, on very low-dose Metformin ER at night.  When she was taking Metformin in the morning she had bloating.  Sugars are usually well controlled except for the times when she takes intraspinal steroid injections.  She gets these 6 months apart.  At last visit HbA1c was excellent, at 6.1%, but slightly higher than before. -At this visit, she brings an excellent log.  Sugars are still at goal with exception of 2 higher values, after a steroid injection.  She is not planning to have any more injections.  Her left knee is turning inwards and is very painful and she is working with orthopedics to get a prosthesis.  She would not want to undergo surgery. -For now, I would not recommend to change her regimen but I did advise her to keep an eye on her sugars and let me know if the sugars increase before next visit. - I suggested to:  Patient Instructions  Please continue Metformin ER 500 mg with dinner.  Please stop at the lab.  Try to schedule a new eye exam.  Please return in 6 months with your sugar log.  - we checked her HbA1c: 6.2% (slightly higher, but still excellent) - advised to check sugars at different times of the day - 1x a day, rotating check times - advised for yearly eye exams >> she is not UTD - return to clinic in 6 months  2. HL -Reviewed latest lipid panel from 05/2019: All fractions at goal: Lab Results  Component Value Date   CHOL 146 05/22/2019   HDL 50.10 05/22/2019   LDLCALC 85 05/22/2019   TRIG 55.0 05/22/2019   CHOLHDL 3 05/22/2019  -She is not on a  statin but takes omega-3 fatty acids and garlic. She cannot remember if she took statins in the past. -will recheck today - she is open to try a statin if needed  3. PN -In the past she  was on alpha-lipoic acid and B complex, now off -She was also on B12 injections but stopped -Her numbness and tingling are almost resolved  Office Visit on 05/24/2020  Component Date Value Ref Range Status  . Cholesterol 05/24/2020 148  0 - 200 mg/dL Final   ATP III Classification       Desirable:  < 200 mg/dL               Borderline High:  200 - 239 mg/dL          High:  > = 240 mg/dL  . Triglycerides 05/24/2020 94.0  0 - 149 mg/dL Final   Normal:  <150 mg/dLBorderline High:  150 - 199 mg/dL  . HDL 05/24/2020 44.40  >39.00 mg/dL Final  . VLDL 05/24/2020 18.8  0.0 - 40.0 mg/dL Final  . LDL Cholesterol 05/24/2020 85  0 - 99 mg/dL Final  . Total CHOL/HDL Ratio 05/24/2020 3   Final                  Men          Women1/2 Average Risk     3.4          3.3Average Risk          5.0          4.42X Average Risk          9.6          7.13X Average Risk          15.0          11.0                      . NonHDL 05/24/2020 103.68   Final   NOTE:  Non-HDL goal should be 30 mg/dL higher than patient's LDL goal (i.e. LDL goal of < 70 mg/dL, would have non-HDL goal of < 100 mg/dL)  . Hemoglobin A1C 05/24/2020 6.2* 4.0 - 5.6 % Final    Her lipid panel is good, but for cardiovascular protection, I would still suggest to start the low-dose pravastatin, 20 mg daily.  Will discuss with the patient.  Philemon Kingdom, MD PhD [facility] Endocrinology

## 2020-05-24 NOTE — Patient Instructions (Signed)
Please continue Metformin ER 500 mg with dinner.  Please stop at the lab.  Try to schedule a new eye exam.  Please return in 6 months with your sugar log.

## 2020-05-24 NOTE — Addendum Note (Signed)
Addended by: Cardell Peach I on: 05/24/2020 08:39 AM   Modules accepted: Orders

## 2020-05-25 ENCOUNTER — Encounter: Payer: Self-pay | Admitting: Family Medicine

## 2020-05-26 ENCOUNTER — Telehealth: Payer: Self-pay

## 2020-05-26 MED ORDER — PRAVASTATIN SODIUM 20 MG PO TABS: 20.0000 mg | ORAL_TABLET | Freq: Every day | ORAL | 3 refills | Status: DC

## 2020-05-26 NOTE — Telephone Encounter (Signed)
Notified patient of message from Dr. Cruzita Lederer, patient expressed understanding and agreement. No further questions.  Patient agrees to start the medication, I have sent it to [facility] per patient request.

## 2020-05-26 NOTE — Telephone Encounter (Signed)
-----   Message from Philemon Kingdom, MD sent at 05/24/2020 12:57 PM EDT ----- Lenna Sciara, can you please call pt: Her lipid panel is good.  For cardiovascular protection, I would still suggest to start the low-dose pravastatin, 20 mg daily.  That she agrees with this?  If so, we can send 90 tablets to her pharmacy with 3 refills.

## 2020-05-28 DIAGNOSIS — M1712 Unilateral primary osteoarthritis, left knee: Secondary | ICD-10-CM | POA: Diagnosis not present

## 2020-05-28 DIAGNOSIS — L718 Other rosacea: Secondary | ICD-10-CM | POA: Diagnosis not present

## 2020-05-31 ENCOUNTER — Other Ambulatory Visit: Payer: Self-pay

## 2020-05-31 ENCOUNTER — Ambulatory Visit (INDEPENDENT_AMBULATORY_CARE_PROVIDER_SITE_OTHER): Payer: Medicare Other

## 2020-05-31 VITALS — Ht 66.5 in | Wt 175.8 lb

## 2020-05-31 DIAGNOSIS — Z0000 Encounter for general adult medical examination without abnormal findings: Secondary | ICD-10-CM

## 2020-05-31 NOTE — Progress Notes (Signed)
PCP notes:  Health Maintenance: Foot exam- due   Abnormal Screenings: none   Patient concerns: Since wearing a brace on her knee per doctor's order, her ankle is swelling up pretty bad   Nurse concerns: none   Next PCP appt.: 11/22/2020 @ 9:30 am

## 2020-05-31 NOTE — Progress Notes (Signed)
Subjective:   Gabriela [name] is a 74 y.o. female who presents for Medicare Annual (Subsequent) preventive examination.  Review of Systems: N/A     I connected with the patient today by telephone and verified that I am speaking with the correct person using two identifiers. Location patient: home Location nurse: work Persons participating in the virtual visit: patient, Marine scientist.   I discussed the limitations, risks, security and privacy concerns of performing an evaluation and management service by telephone and the availability of in person appointments. I also discussed with the patient that there may be a patient responsible charge related to this service. The patient expressed understanding and verbally consented to this telephonic visit.    Interactive audio and video telecommunications were attempted between this nurse and patient, however failed, due to patient having technical difficulties OR patient did not have access to video capability.  We continued and completed visit with audio only.     Cardiac Risk Factors include: advanced age (>59mn, >>67women);diabetes mellitus;dyslipidemia     Objective:    Today's Vitals   05/31/20 0756 05/31/20 0812  Weight: 175 lb 12 oz (79.7 kg)   Height: 5' 6.5" (1.689 m)   PainSc:  5    Body mass index is 27.94 kg/m.  Advanced Directives 05/31/2020 10/08/2019 04/10/2019 09/28/2018 05/14/2018 04/08/2018 03/08/2018  Does Patient Have a Medical Advance Directive? Yes Yes Yes No Yes Yes Yes  Type of AParamedicof ANew [place] CenterLiving will [place] Grove[place] will [place]uck[place] will - Living will;Healthcare Power of Attorney Living will Living will  Does patient want to make changes to medical advance directive? - No - Patient declined - - - - No - Patient declined  Copy of [place] Chart? No - copy requested Yes - validated most recent copy scanned in chart (See row  information) No - copy requested - - - -  Would patient like information on creating a medical advance directive? - - - No - Patient declined - - No - Patient declined    Current Medications (verified) Outpatient Encounter Medications as of 05/31/2020  Medication Sig  . Biotin 1000 MCG tablet Take 1,000 mcg by mouth daily.  . Blood Glucose Monitoring Suppl (ONE TOUCH ULTRA 2) w/Device KIT USE AS DIRECTED  . cetirizine (ZYRTEC) 10 MG tablet Take 10 mg by mouth.  . Cholecalciferol (VITAMIN D) 2000 units tablet Take by mouth.  . diphenhydrAMINE (BENADRYL) 25 MG tablet Take 25 mg by mouth 2 (two) times daily.  . Garlic 16195MG CAPS Take by mouth.  .Marland Kitchenglucosamine-chondroitin 500-400 MG tablet Take 1 tablet by mouth daily.  . Lancets (ONETOUCH DELICA PLUS [id number] MISC USE TO CHECK SUGAR DAILY  . metFORMIN (GLUCOPHAGE-XR) 500 MG 24 hr tablet Take 1 tablet (500 mg total) by mouth daily with supper.  . Multiple Vitamins-Minerals (MULTIVITAMIN WOMEN 50+ PO) Take 1 tablet by mouth daily.  . Omega-3 1000 MG CAPS Take by mouth.  .Glory RosebushULTRA test strip USE ONCE DAILY.  . pravastatin (PRAVACHOL) 20 MG tablet Take 1 tablet (20 mg total) by mouth daily.  . tamoxifen (NOLVADEX) 20 MG tablet Take 1 tablet (20 mg total) by mouth daily.  . vitamin B-12 (CYANOCOBALAMIN) 1000 MCG tablet Take 1,000 mcg by mouth daily. dissovable   No facility-administered encounter medications on file as of 05/31/2020.    Allergies (verified) Oxycontin [oxycodone hcl] and Sulfa antibiotics   History: Past Medical History:  Diagnosis Date  . Arthritis   . Cancer ([place])    left breast  . Chronic pain    from fall 2012  . Diabetes mellitus without complication (HCC)    diet controlled  . History of closed head injury 2012   accident work threw her 37f  . Hyperlipemia   . Personal history of radiation therapy   . Spinal stenosis   . Urinary incontinence    wears a pessery   Past Surgical History:  Procedure  Laterality Date  . ABDOMINAL HYSTERECTOMY    . BREAST LUMPECTOMY  1999   left  . BREAST LUMPECTOMY Left 2016  . BREAST LUMPECTOMY WITH RADIOACTIVE SEED LOCALIZATION Left 03/08/2018   Procedure: LEFT BREAST LUMPECTOMY WITH RADIOACTIVE SEED LOCALIZATION;  Surgeon: HExcell Seltzer MD;  [place] [facility]  Service: General;  Laterality: Left;  . CATARACT EXTRACTION W/PHACO Left 10/08/2019   Procedure: CATARACT EXTRACTION PHACO AND INTRAOCULAR LENS PLACEMENT (IOC) LEFT 3.47, 00:46.0,  18.8%;  Surgeon: BLeandrew Koyanagi MD;  [place] [facility]  Service: Ophthalmology;  Laterality: Left;  Diabetes - oral meds  . COLONOSCOPY    . COLONOSCOPY WITH PROPOFOL N/A 05/14/2018   Procedure: COLONOSCOPY WITH PROPOFOL;  Surgeon: AJonathon Bellows MD;  [facility]  Service: Gastroenterology;  Laterality: N/A;  . ORIF WRIST FRACTURE Right 06/15/2017   Procedure: OPEN REDUCTION INTERNAL FIXATION (ORIF) WRIST FRACTURE WITH REDUCTION AND PINNINGT OF CARPAL DISLOCATION;  Surgeon: TMilly Jakob MD;  [place] [facility]  Service: Orthopedics;  Laterality: Right;  . RADIOACTIVE SEED GUIDED PARTIAL MASTECTOMY WITH AXILLARY SENTINEL LYMPH NODE BIOPSY Left 02/08/2015   Procedure: RADIOACTIVE SEED LOCALIZATION LUMPECTOMY WITH LEFT AXILLARY SENTINEL LYMPH NODE BIOPSY;  Surgeon: BExcell Seltzer MD;  [place] [facility]  Service: General;  Laterality: Left;  . SHOULDER ARTHROSCOPY  2012   left  . TONSILLECTOMY     Family History  Problem Relation Age of Onset  . Colon cancer Mother        possibly caused by medication during pregnancy  . Breast cancer Maternal Aunt        dx in 735s . Cervical cancer Maternal Aunt        dx in 768s  Social History   Socioeconomic History  . Marital status: Married    Spouse name: Not on file  . Number of children: Not on file  . Years of education: Not on file  . Highest education level: Not on file  Occupational History    . Not on file  Tobacco Use  . Smoking status: Never Smoker  . Smokeless tobacco: Never Used  Vaping Use  . Vaping Use: Never used  Substance and Sexual Activity  . Alcohol use: No  . Drug use: No  . Sexual activity: Not Currently  Other Topics Concern  . Not on file  Social History Narrative   The patient and her husband GDominica Severinare both retired. They live [place] [address] in our quite busy caring for that. At is also Deltha's chief source of exercise. Daughter LJamesetta Sois an occupational hProgrammer, applicationsin [place] Daughter JRolly Salteris a rCabin crewin DSparta Son BGraceanna Theissenworks as a sLibrarian, academicfor the twin LThe [facility] in [place] The patient has 4 grandchildren. She is a BPsychologist, forensic   SScientist, physiologicalStrain: Low Risk   . Difficulty of Paying Living Expenses: Not hard at all  Food Insecurity: No Food Insecurity  .  Worried About Charity fundraiser in the Last Year: Never true  . Ran Out of Food in the Last Year: Never true  Transportation Needs: No Transportation Needs  . Lack of Transportation (Medical): No  . Lack of Transportation (Non-Medical): No  Physical Activity: Insufficiently Active  . Days of Exercise per Week: 7 days  . Minutes of Exercise per Session: 10 min  Stress: No Stress Concern Present  . Feeling of Stress : Not at all  Social Connections:   . Frequency of Communication with Friends and Family:   . Frequency of Social Gatherings with Friends and Family:   . Attends Religious Services:   . Active Member of Clubs or Organizations:   . Attends Archivist Meetings:   Marland Kitchen Marital Status:     Tobacco Counseling Counseling given: Not Answered   Clinical Intake:  Pre-visit preparation completed: Yes  Pain : 0-10 Pain Score: 5  Pain Type: Chronic pain Pain Location: Knee Pain Orientation: Left Pain Descriptors / Indicators: Aching Pain Onset: More than a month ago Pain Frequency: Intermittent      Nutritional Status: BMI 25 -29 Overweight Nutritional Risks: None Diabetes: Yes CBG done?: No Did pt. bring in CBG monitor from home?: No  How often do you need to have someone help you when you read instructions, pamphlets, or other written materials from your doctor or pharmacy?: 1 - Never What is the last grade level you completed in school?: business college  Diabetic: Yes Nutrition Risk Assessment:  Has the patient had any N/V/D within the last 2 months?  No  Does the patient have any non-healing wounds?  No  Has the patient had any unintentional weight loss or weight gain?  No   Diabetes:  Is the patient diabetic?  Yes  If diabetic, was a CBG obtained today?  No  Did the patient bring in their glucometer from home?  No  How often do you monitor your CBG's? daily.   Financial Strains and Diabetes Management:  Are you having any financial strains with the device, your supplies or your medication? No .  Does the patient want to be seen by Chronic Care Management for management of their diabetes?  No  Would the patient like to be referred to a Nutritionist or for Diabetic Management?  No   Diabetic Exams:  Diabetic Eye Exam: Completed 09/09/2019 Diabetic Foot Exam: Overdue, Pt has been advised about the importance in completing this exam. Pt is scheduled for diabetic foot exam on next visit .   Interpreter Needed?: No  Information entered by :: CJohnson, LPN   Activities of Daily Living In your present state of health, do you have any difficulty performing the following activities: 05/31/2020 10/08/2019  Hearing? N N  Vision? N N  Difficulty concentrating or making decisions? N N  Walking or climbing stairs? N N  Dressing or bathing? N N  Doing errands, shopping? N -  Preparing Food and eating ? N -  Using the Toilet? N -  In the past six months, have you accidently leaked urine? N -  Do you have problems with loss of bowel control? N -  Managing your  Medications? N -  Managing your Finances? N -  Housekeeping or managing your Housekeeping? N -  Some recent data might be hidden    Patient Care Team: [name], FNP as PCP - General (Nurse Practitioner) Excell Seltzer, MD (Inactive) as Consulting Physician (General Surgery) Philemon Kingdom, MD  as Consulting Physician (Internal Medicine) Jonathon Bellows, MD as Consulting Physician (Gastroenterology) Magrinat, Virgie Dad, MD as Consulting Physician (Oncology)  Indicate any recent Medical Services you may have received from other than [facility] providers in the past year (date may be approximate).     Assessment:   This is a routine wellness examination for Orabelle.  Hearing/Vision screen  Hearing Screening   '125Hz'  '250Hz'  '500Hz'  '1000Hz'  '2000Hz'  '3000Hz'  '4000Hz'  '6000Hz'  '8000Hz'   Right ear:           Left ear:           Vision Screening Comments: Patient gets annual eye exams   Dietary issues and exercise activities discussed: Current Exercise Habits: Home exercise routine, Type of exercise: walking, Time (Minutes): 15, Frequency (Times/Week): 7, Weekly Exercise (Minutes/Week): 105, Intensity: Moderate, Exercise limited by: None identified  Goals    . Patient Stated     Starting 04/10/19, I will continue to take medications as prescribed.     . Patient Stated     05/31/2020, I will continue to walk everyday for about 15 minutes.       Depression Screen PHQ 2/9 Scores 05/31/2020 05/21/2020 04/10/2019 04/17/2018 04/08/2018 12/03/2015  PHQ - 2 Score 0 0 0 0 0 0  PHQ- 9 Score 0 0 0 - 0 -    Fall Risk Fall Risk  05/31/2020 05/21/2020 04/10/2019 04/17/2018 04/08/2018  Falls in the past year? 0 0 0 No No  Number falls in past yr: 0 - - - -  Injury with Fall? 0 - - - -  Risk for fall due to : Medication side effect - - - -  Follow up Falls evaluation completed;Falls prevention discussed - - - -    Any stairs in or around the home? Yes  If so, are there any without handrails? No  Home free of loose  throw rugs in walkways, pet beds, electrical cords, etc? Yes  Adequate lighting in your home to reduce risk of falls? Yes   ASSISTIVE DEVICES UTILIZED TO PREVENT FALLS:  Life alert? No  Use of a cane, walker or w/c? No  Grab bars in the bathroom? No  Shower chair or bench in shower? No  Elevated toilet seat or a handicapped toilet? No   TIMED UP AND GO:  Was the test performed? N/A, telephonic visit .    Cognitive Function: MMSE - Mini Mental State Exam 05/31/2020 04/10/2019 04/08/2018  Orientation to time '5 5 5  ' Orientation to Place '5 5 5  ' Registration '3 3 3  ' Attention/ Calculation 5 0 0  Recall '3 3 2  ' Recall-comments - - unable to recall 1 of 3 words  Language- name 2 objects - 0 0  Language- repeat '1 1 1  ' Language- follow 3 step command - 0 3  Language- read & follow direction - 0 0  Write a sentence - 0 0  Copy design - 0 0  Total score - 17 19  Mini Cog  Mini-Cog screen was completed. Maximum score is 22. A value of 0 denotes this part of the MMSE was not completed or the patient failed this part of the Mini-Cog screening.       Immunizations Immunization History  Administered Date(s) Administered  . Influenza, High Dose Seasonal PF 08/16/2017, 07/26/2018  . Influenza, Quadrivalent, Recombinant, Inj, Pf 07/25/2019, 09/09/2019  . Influenza-Unspecified 08/05/2014, 08/06/2016, 08/15/2017  . PFIZER SARS-COV-2 Vaccination 12/09/2019, 01/01/2020  . Pneumococcal Conjugate-13 04/17/2018  . Pneumococcal Polysaccharide-23 02/05/2008, 05/16/2019  .  Td 11/06/2006  . Tdap 06/15/2017    TDAP status: Up to date Flu Vaccine status: Up to date Pneumococcal vaccine status: Up to date Covid-19 vaccine status: Completed vaccines  Qualifies for Shingles Vaccine? Yes   Zostavax completed No   Shingrix Completed?: No.    Education has been provided regarding the importance of this vaccine. Patient has been advised to call insurance company to determine out of pocket expense if they  have not yet received this vaccine. Advised may also receive vaccine at local pharmacy or Health Dept. Verbalized acceptance and understanding.  Screening Tests Health Maintenance  Topic Date Due  . FOOT EXAM  10/19/2019  . URINE MICROALBUMIN  05/15/2020  . INFLUENZA VACCINE  06/06/2020  . OPHTHALMOLOGY EXAM  09/08/2020  . HEMOGLOBIN A1C  11/24/2020  . MAMMOGRAM  04/28/2022  . TETANUS/TDAP  06/16/2027  . COLONOSCOPY  05/14/2028  . DEXA SCAN  Completed  . COVID-19 Vaccine  Completed  . Hepatitis C Screening  Completed  . PNA vac Low Risk Adult  Completed    Health Maintenance  Health Maintenance Due  Topic Date Due  . FOOT EXAM  10/19/2019  . URINE MICROALBUMIN  05/15/2020    Colorectal cancer screening: Completed 05/14/2018. Repeat every 10 years Mammogram status: Completed 04/28/2020. Repeat every year Bone Density status: Completed 05/29/2018. Results reflect: Bone density results: NORMAL. Repeat every 2-5 years.  Lung Cancer Screening: (Low Dose CT Chest recommended if Age 42-80 years, 30 pack-year currently smoking OR have quit w/in 15years.) does not qualify.    Additional Screening:  Hepatitis C Screening: does qualify; Completed 04/08/2018  Vision Screening: Recommended annual ophthalmology exams for early detection of glaucoma and other disorders of the eye. Is the patient up to date with their annual eye exam?  Yes  Who is the provider or what is the name of the office in which the patient attends annual eye exams? Dr. Wallace Going, [facility] If pt is not established with a provider, would they like to be referred to a provider to establish care? No .   Dental Screening: Recommended annual dental exams for proper oral hygiene  Community Resource Referral / Chronic Care Management: CRR required this visit?  No   CCM required this visit?  No      Plan:     I have personally reviewed and noted the following in the patient's chart:   . Medical and  social history . Use of alcohol, tobacco or illicit drugs  . Current medications and supplements . Functional ability and status . Nutritional status . Physical activity . Advanced directives . List of other physicians . Hospitalizations, surgeries, and ER visits in previous 12 months . Vitals . Screenings to include cognitive, depression, and falls . Referrals and appointments  In addition, I have reviewed and discussed with patient certain preventive protocols, quality metrics, and best practice recommendations. A written personalized care plan for preventive services as well as general preventive health recommendations were provided to patient.   Due to this being a telephonic visit, the after visit summary with patients personalized plan was offered to patient via mail or my-chart.  Patient preferred to pick up at office at next visit.   Andrez Grime, LPN   08/04/5746

## 2020-05-31 NOTE — Patient Instructions (Signed)
Gabriela [name] , Thank you for taking time to come for your Medicare Wellness Visit. I appreciate your ongoing commitment to your health goals. Please review the following plan we discussed and let me know if I can assist you in the future.   Screening recommendations/referrals: Colonoscopy: Up to date, completed 05/14/2018, due 05/2028 Mammogram: Up to date, completed 04/28/2020, due 04/2021 Bone Density: Up to date, completed 05/29/2018, normal 2-5 years Recommended yearly ophthalmology/optometry visit for glaucoma screening and checkup Recommended yearly dental visit for hygiene and checkup  Vaccinations: Influenza vaccine: Up to date, completed 09/09/2019, due 06/2020 Pneumococcal vaccine: Completed series Tdap vaccine: Up to date, completed 06/15/2017, due 06/2027 Shingles vaccine: due, check with insurance regarding coverage   Covid-19:Completed series  Advanced directives: Please bring a copy of your POA (Power of Attorney) and/or Living Will to your next appointment.   Conditions/risks identified: Diabetes, hyperlipidemia  Next appointment: Follow up in one year for your annual wellness visit    Preventive Care 73 Years and Older, Female Preventive care refers to lifestyle choices and visits with your health care provider that can promote health and wellness. What does preventive care include?  A yearly physical exam. This is also called an annual well check.  Dental exams once or twice a year.  Routine eye exams. Ask your health care provider how often you should have your eyes checked.  Personal lifestyle choices, including:  Daily care of your teeth and gums.  Regular physical activity.  Eating a healthy diet.  Avoiding tobacco and drug use.  Limiting alcohol use.  Practicing safe sex.  Taking low-dose aspirin every day.  Taking vitamin and mineral supplements as recommended by your health care provider. What happens during an annual well check? The services and  screenings done by your health care provider during your annual well check will depend on your age, overall health, lifestyle risk factors, and family history of disease. Counseling  Your health care provider may ask you questions about your:  Alcohol use.  Tobacco use.  Drug use.  Emotional well-being.  Home and relationship well-being.  Sexual activity.  Eating habits.  History of falls.  Memory and ability to understand (cognition).  Work and work Statistician.  Reproductive health. Screening  You may have the following tests or measurements:  Height, weight, and BMI.  Blood pressure.  Lipid and cholesterol levels. These may be checked every 5 years, or more frequently if you are over 34 years old.  Skin check.  Lung cancer screening. You may have this screening every year starting at age 73 if you have a 30-pack-year history of smoking and currently smoke or have quit within the past 15 years.  Fecal occult blood test (FOBT) of the stool. You may have this test every year starting at age 73.  Flexible sigmoidoscopy or colonoscopy. You may have a sigmoidoscopy every 5 years or a colonoscopy every 10 years starting at age 73.  Hepatitis C blood test.  Hepatitis B blood test.  Sexually transmitted disease (STD) testing.  Diabetes screening. This is done by checking your blood sugar (glucose) after you have not eaten for a while (fasting). You may have this done every 1-3 years.  Bone density scan. This is done to screen for osteoporosis. You may have this done starting at age 73.  Mammogram. This may be done every 1-2 years. Talk to your health care provider about how often you should have regular mammograms. Talk with your health care provider about your  test results, treatment options, and if necessary, the need for more tests. Vaccines  Your health care provider may recommend certain vaccines, such as:  Influenza vaccine. This is recommended every  year.  Tetanus, diphtheria, and acellular pertussis (Tdap, Td) vaccine. You may need a Td booster every 10 years.  Zoster vaccine. You may need this after age 73.  Pneumococcal 13-valent conjugate (PCV13) vaccine. One dose is recommended after age 73.  Pneumococcal polysaccharide (PPSV23) vaccine. One dose is recommended after age 73. Talk to your health care provider about which screenings and vaccines you need and how often you need them. This information is not intended to replace advice given to you by your health care provider. Make sure you discuss any questions you have with your health care provider. Document Released: 11/19/2015 Document Revised: 07/12/2016 Document Reviewed: 08/24/2015 Elsevier Interactive Patient Education  2017 [place] Prevention in the Home Falls can cause injuries. They can happen to people of all ages. There are many things you can do to make your home safe and to help prevent falls. What can I do on the outside of my home?  Regularly fix the edges of walkways and driveways and fix any cracks.  Remove anything that might make you trip as you walk through a door, such as a raised step or threshold.  Trim any bushes or trees on the path to your home.  Use bright outdoor lighting.  Clear any walking paths of anything that might make someone trip, such as rocks or tools.  Regularly check to see if handrails are loose or broken. Make sure that both sides of any steps have handrails.  Any raised decks and porches should have guardrails on the edges.  Have any leaves, snow, or ice cleared regularly.  Use sand or salt on walking paths during winter.  Clean up any spills in your garage right away. This includes oil or grease spills. What can I do in the bathroom?  Use night lights.  Install grab bars by the toilet and in the tub and shower. Do not use towel bars as grab bars.  Use non-skid mats or decals in the tub or shower.  If you  need to sit down in the shower, use a plastic, non-slip stool.  Keep the floor dry. Clean up any water that spills on the floor as soon as it happens.  Remove soap buildup in the tub or shower regularly.  Attach bath mats securely with double-sided non-slip rug tape.  Do not have throw rugs and other things on the floor that can make you trip. What can I do in the bedroom?  Use night lights.  Make sure that you have a light by your bed that is easy to reach.  Do not use any sheets or blankets that are too big for your bed. They should not hang down onto the floor.  Have a firm chair that has side arms. You can use this for support while you get dressed.  Do not have throw rugs and other things on the floor that can make you trip. What can I do in the kitchen?  Clean up any spills right away.  Avoid walking on wet floors.  Keep items that you use a lot in easy-to-reach places.  If you need to reach something above you, use a strong step stool that has a grab bar.  Keep electrical cords out of the way.  Do not use floor polish or wax that  makes floors slippery. If you must use wax, use non-skid floor wax.  Do not have throw rugs and other things on the floor that can make you trip. What can I do with my stairs?  Do not leave any items on the stairs.  Make sure that there are handrails on both sides of the stairs and use them. Fix handrails that are broken or loose. Make sure that handrails are as long as the stairways.  Check any carpeting to make sure that it is firmly attached to the stairs. Fix any carpet that is loose or worn.  Avoid having throw rugs at the top or bottom of the stairs. If you do have throw rugs, attach them to the floor with carpet tape.  Make sure that you have a light switch at the top of the stairs and the bottom of the stairs. If you do not have them, ask someone to add them for you. What else can I do to help prevent falls?  Wear shoes  that:  Do not have high heels.  Have rubber bottoms.  Are comfortable and fit you well.  Are closed at the toe. Do not wear sandals.  If you use a stepladder:  Make sure that it is fully opened. Do not climb a closed stepladder.  Make sure that both sides of the stepladder are locked into place.  Ask someone to hold it for you, if possible.  Clearly mark and make sure that you can see:  Any grab bars or handrails.  First and last steps.  Where the edge of each step is.  Use tools that help you move around (mobility aids) if they are needed. These include:  Canes.  Walkers.  Scooters.  Crutches.  Turn on the lights when you go into a dark area. Replace any light bulbs as soon as they burn out.  Set up your furniture so you have a clear path. Avoid moving your furniture around.  If any of your floors are uneven, fix them.  If there are any pets around you, be aware of where they are.  Review your medicines with your doctor. Some medicines can make you feel dizzy. This can increase your chance of falling. Ask your doctor what other things that you can do to help prevent falls. This information is not intended to replace advice given to you by your health care provider. Make sure you discuss any questions you have with your health care provider. Document Released: 08/19/2009 Document Revised: 03/30/2016 Document Reviewed: 11/27/2014 Elsevier Interactive Patient Education  2017 [employer].

## 2020-06-02 ENCOUNTER — Telehealth: Payer: Self-pay | Admitting: Internal Medicine

## 2020-06-02 NOTE — Telephone Encounter (Signed)
Called pt and explained to her that Dr. Cruzita Lederer indicated that she would like to add pravastatin if the pt agreed to reduce the risk of CVD and other related events. Pt verbalized understanding and did state that she has no issue taking this medication.

## 2020-06-02 NOTE — Telephone Encounter (Signed)
Patient called asking if there was something in her blood work that indicated her need to be taking pravastatin, or what the reasoning was for taking it. Patient ph# [phone]

## 2020-06-03 DIAGNOSIS — H2511 Age-related nuclear cataract, right eye: Secondary | ICD-10-CM | POA: Diagnosis not present

## 2020-06-03 LAB — HM DIABETES EYE EXAM

## 2020-06-07 ENCOUNTER — Encounter: Payer: Self-pay | Admitting: Internal Medicine

## 2020-07-13 ENCOUNTER — Other Ambulatory Visit: Payer: Self-pay | Admitting: Internal Medicine

## 2020-10-01 IMAGING — MG DIGITAL DIAGNOSTIC BILATERAL MAMMOGRAM WITH TOMO AND CAD
6 of 10 series · 6 of 26 positions shown · non-contrast
Comparison: Previous exam(s).

CLINICAL DATA: Patient has history of 3 separate lumpectomies in
the left breast for breast carcinoma, most recent performed in 3107.
She is currently on tamoxifen.

EXAM:
DIGITAL DIAGNOSTIC BILATERAL MAMMOGRAM WITH CAD AND TOMO

[L MLO]
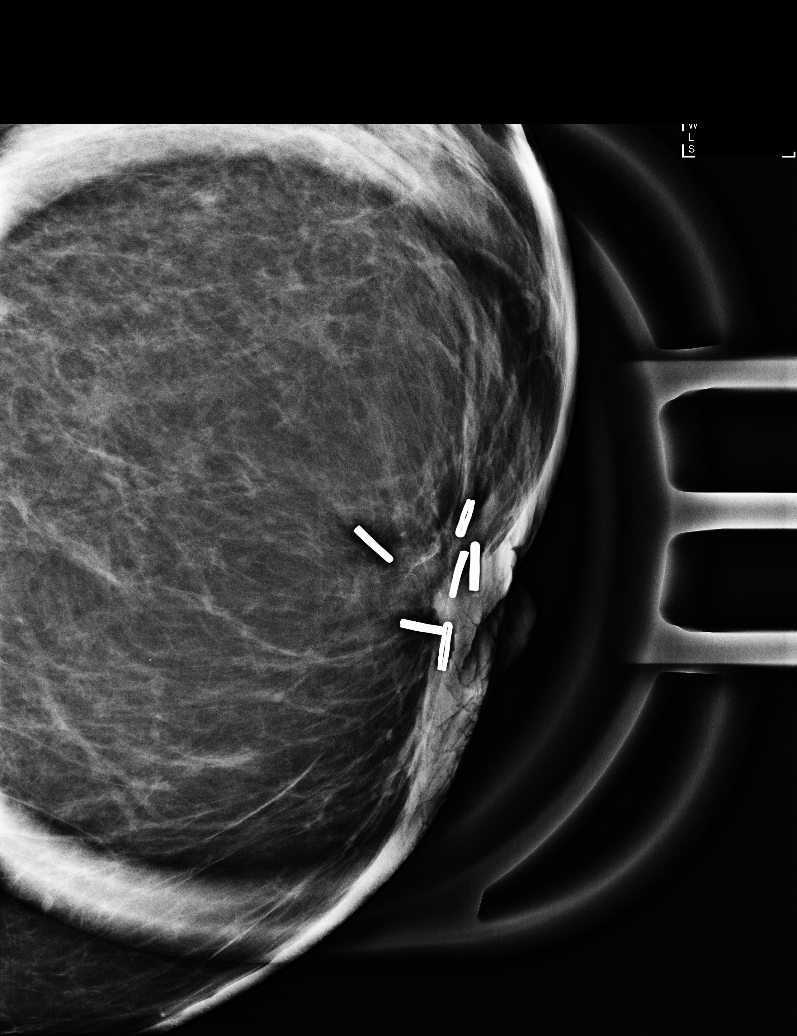

[L CC]
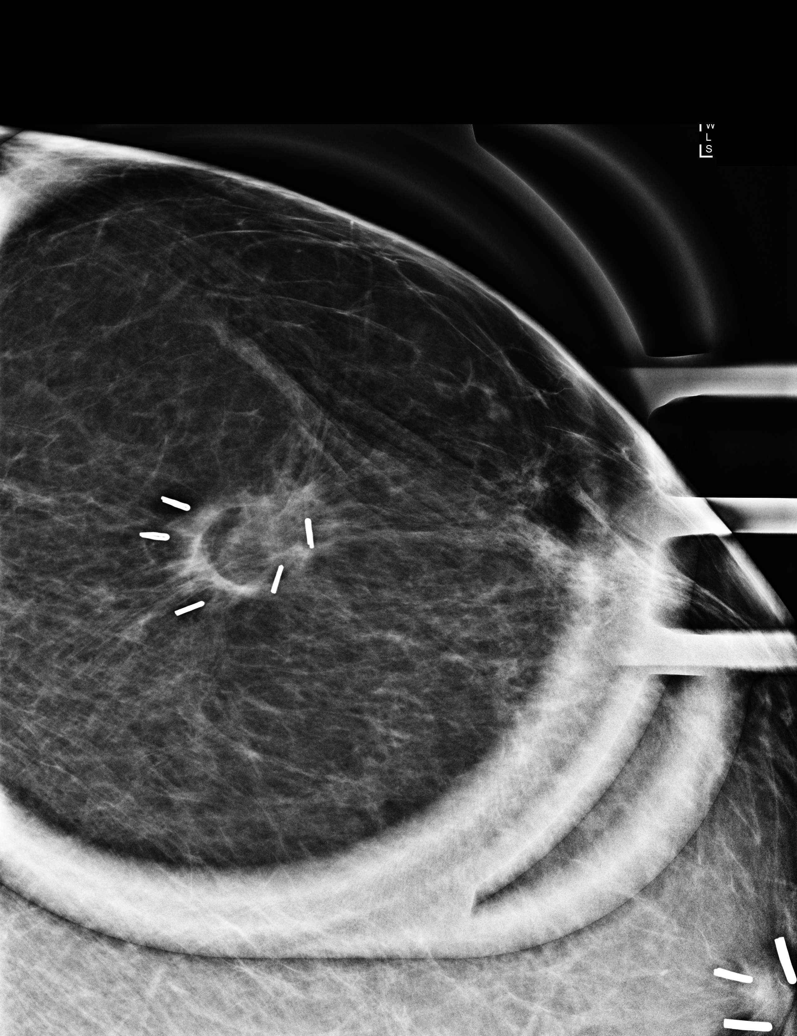

[R CC synth-2D]
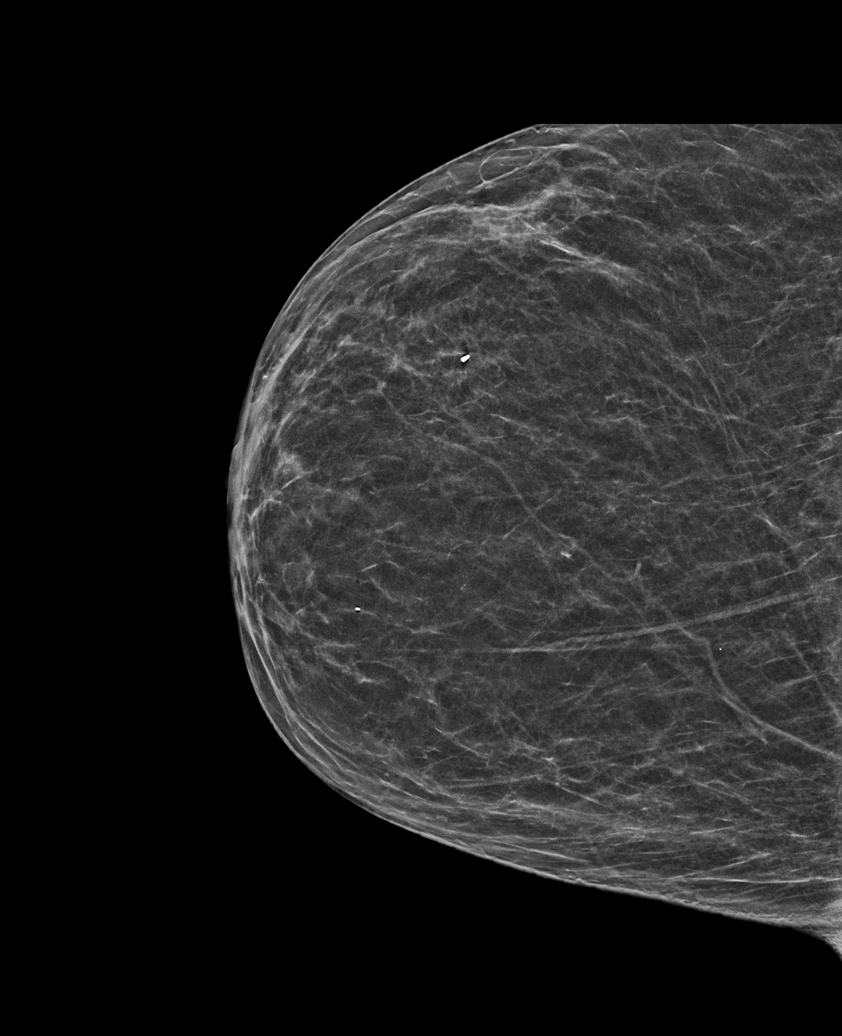

[R MLO synth-2D]
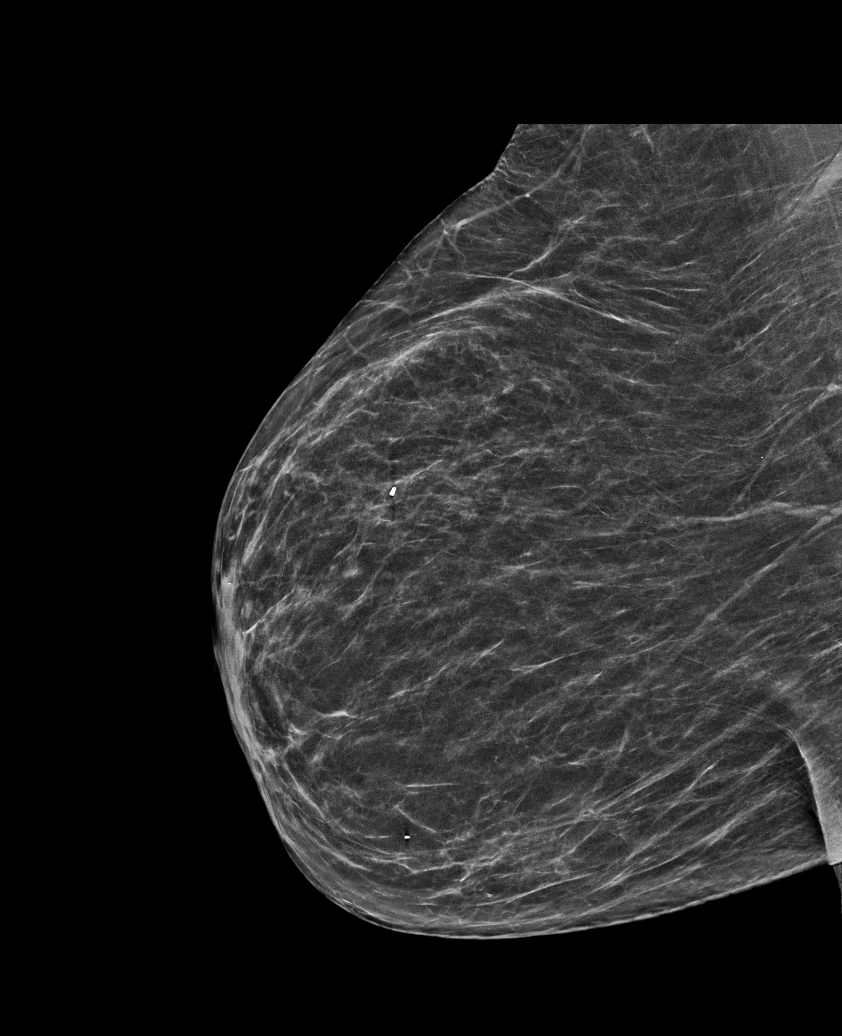

[L CC synth-2D]
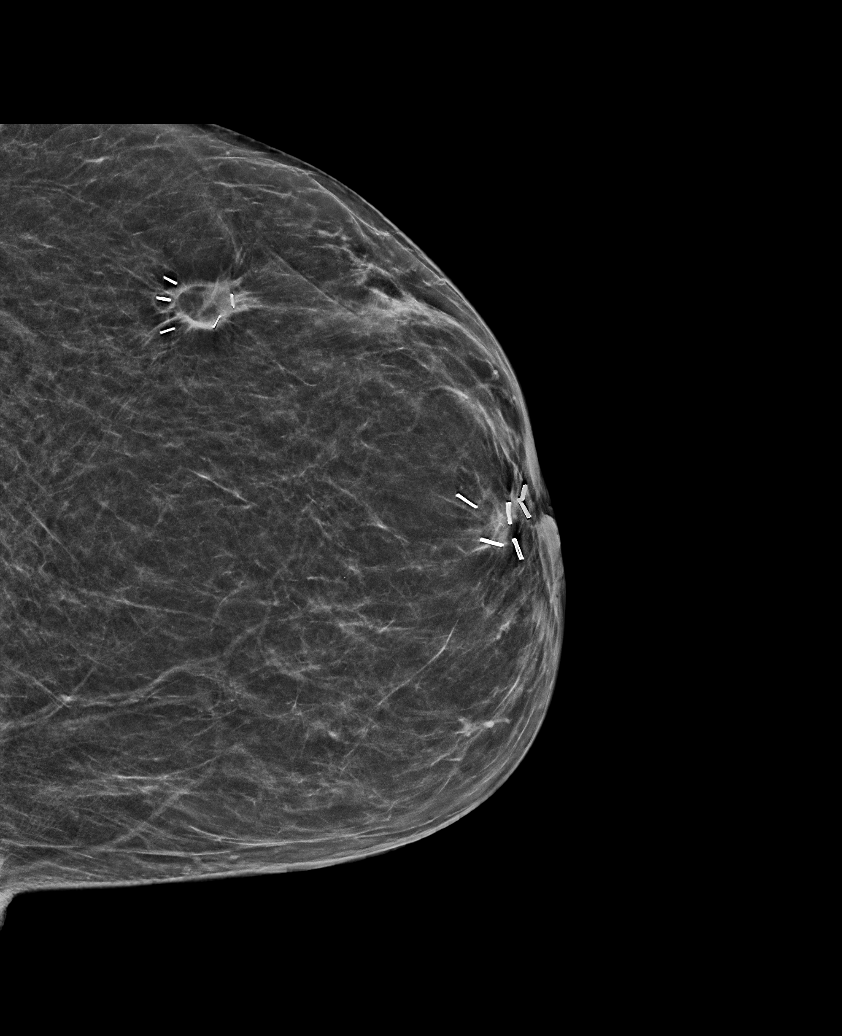

[L MLO synth-2D]
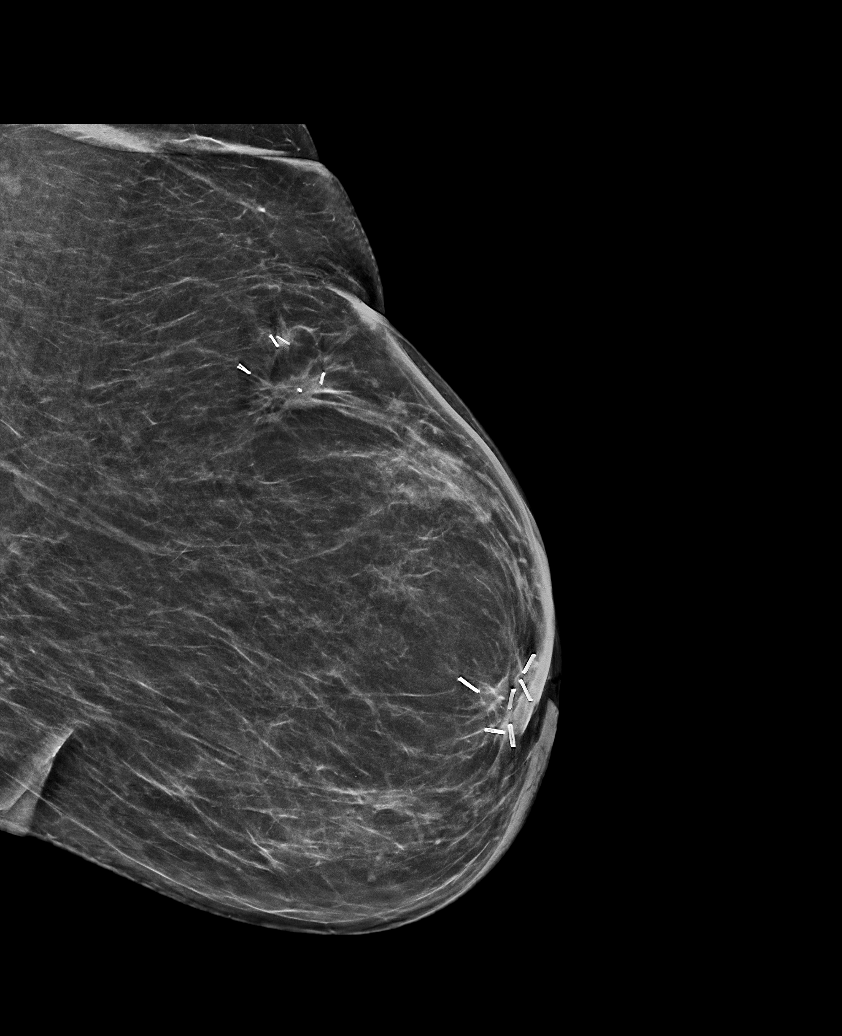

[6 of 26 positions shown; findings below may reference images not displayed]

ACR Breast Density Category b: There are scattered areas of
fibroglandular density.
FINDINGS: There are postsurgical changes from the most recent lumpectomy in
the upper outer left breast and from the lumpectomy from 9564 in the
retroareolar breast.

There are no masses or areas of nonsurgical architectural
distortion. There are no new or suspicious calcifications.

Mammographic images were processed with CAD.
IMPRESSION: 1. No evidence of new or recurrent breast carcinoma.
2. Benign postsurgical changes on the left.

RECOMMENDATION:
1. Diagnostic mammography in 1 year per standard post lumpectomy
protocol.

I have discussed the findings and recommendations with the patient.
Results were also provided in writing at the conclusion of the
visit. If applicable, a reminder letter will be sent to the patient
regarding the next appointment.

BI-RADS CATEGORY  2: Benign.

## 2020-10-10 ENCOUNTER — Other Ambulatory Visit: Payer: Self-pay | Admitting: Internal Medicine

## 2020-11-01 ENCOUNTER — Other Ambulatory Visit: Payer: Self-pay

## 2020-11-01 ENCOUNTER — Ambulatory Visit
Admission: RE | Admit: 2020-11-01 | Discharge: 2020-11-01 | Disposition: A | Discharge status: Home / Self Care | Payer: Medicare Other | Source: Ambulatory Visit | Attending: Oncology | Admitting: Oncology

## 2020-11-01 DIAGNOSIS — Z9889 Other specified postprocedural states: Secondary | ICD-10-CM

## 2020-11-01 DIAGNOSIS — R921 Mammographic calcification found on diagnostic imaging of breast: Secondary | ICD-10-CM

## 2020-11-22 ENCOUNTER — Ambulatory Visit: Payer: Medicare Other | Admitting: Family Medicine

## 2020-11-23 NOTE — Progress Notes (Deleted)
Patient ID: Gabriela [name], female   DOB: 12-10-1945, 75 y.o.   MRN: [id number]   This visit occurred during the SARS-CoV-2 public health emergency.  Safety protocols were in place, including screening questions prior to the visit, additional usage of staff PPE, and extensive cleaning of exam room while observing appropriate contact time as indicated for disinfecting solutions.   HPI: Gabriela [name] is a 75 y.o.-year-old female, returning for f/u for DM2, dx in 04/2014, non-insulin-dependent, controlled, without long-term complications. Last visit 6 months ago.  Reviewed HbA1c levels: Lab Results  Component Value Date   HGBA1C 6.2 (A) 05/24/2020   HGBA1C 6.1 (A) 11/24/2019   HGBA1C 5.9 (A) 05/22/2019   HGBA1C 5.9 (A) 11/19/2018   HGBA1C 6.4 04/08/2018   HGBA1C 6 11/16/2017   HGBA1C 5.9 05/16/2017   HGBA1C 5.8 12/27/2016  08/08/2016: HbA1c 7.0% 02/04/2016: HbA1c 6.7% 05/12/2015: HbA1c 6.3% 11/17/2014: HbA1c 6.1% 08/07/2014: HbA1c 6.4%  Pt is on: - Metformin ER 500 mg 2x a day >> 500 mg with dinner-she could not tolerate a.m. doses due to bloating She could not tolerate Metformin IR: HA, reflux, borborygmi  Pt checks her sugars once a day: - am: 130s-160, 170 >> n/c  - 2h after b'fast: 100-110 >> 93-110 >> 108-118, 132, 148 (steroid inj) - before lunch: n/c  - 2h after lunch: 90-112 >> 94-118 >> 109-125, 151 (steroid inj) - before dinner: n/c - 2h after dinner: 90-126 >> 93-115 >> 86-121 >> 107-128 - bedtime: n/c - nighttime: n/c Lowest sugar was 86 >> 107; it is unclear at which level she has hypoglycemia awareness. Highest sugar was 201 (steroid inj) >>...121 >> 151.  Glucometer: One Touch Ultra  Pt's meals are: - Breakfast: multi-grain sandwich with egg salad - Lunch: may skip; multi-grain crackers with salad or homemade pimento cheese - Dinner: grilled or broiled meat and vegetable - Snacks: apple/pear; almonds/cashews  No CKD, last BUN/creatinine:  Lab Results   Component Value Date   BUN 19 04/13/2020   BUN 14 04/14/2019   CREATININE 0.92 04/13/2020   CREATININE 0.83 04/14/2019   + HL;  last set of lipids: Lab Results  Component Value Date   CHOL 148 05/24/2020   HDL 44.40 05/24/2020   LDLCALC 85 05/24/2020   TRIG 94.0 05/24/2020   CHOLHDL 3 05/24/2020  02/04/2016: 181/84/43/121 At last visit, I suggested pravastatin 20 mg daily.  She is also on omega-3 fatty acids 1200 mg twice a day and garlic. - last eye exam was 02/2019: No DR, + cataract. -+ Improved numbness and tingling in her feet.  Previously on alpha-lipoic acid and B complex.  She was also on B12 injections in the past, now takes this p.o.  She has a history of subdural hematoma 6 years ago after an accident when she was working at [employer]. Since then, she has hand tremors. In 2016 she has been dx'ed with BrCA and also had a total hysterectomy and a pelvic repair surgery.   She likes to work in her garden.  Husband dx'ed with colon cancer - had colectomy at the beginning of 2020, and he had 2 AMIs Spring 2019.  ROS: Constitutional: no weight gain/no weight loss, no fatigue, no subjective hyperthermia, no subjective hypothermia Eyes: no blurry vision, no xerophthalmia ENT: no sore throat, no nodules palpated in neck, no dysphagia, no odynophagia, no hoarseness Cardiovascular: no CP/no SOB/no palpitations/no leg swelling Respiratory: no cough/no SOB/no wheezing Gastrointestinal: no N/no V/no D/no C/no acid reflux Musculoskeletal: no muscle  aches/+ joint aches (left knee) Skin: no rashes, no hair loss Neurological: no tremors/no numbness/no tingling/no dizziness  I reviewed pt's medications, allergies, PMH, social hx, family hx, and changes were documented in the history of present illness. Otherwise, unchanged from my initial visit note.  Past Medical History:  Diagnosis Date  . Arthritis   . Cancer ([place])    left breast  . Chronic pain    from fall 2012  . Diabetes  mellitus without complication (HCC)    diet controlled  . History of closed head injury 2012   accident work threw her 34f  . Hyperlipemia   . Personal history of radiation therapy   . Spinal stenosis   . Urinary incontinence    wears a pessery   Past Surgical History:  Procedure Laterality Date  . ABDOMINAL HYSTERECTOMY    . BREAST LUMPECTOMY  1999   left  . BREAST LUMPECTOMY Left 2016  . BREAST LUMPECTOMY WITH RADIOACTIVE SEED LOCALIZATION Left 03/08/2018   Procedure: LEFT BREAST LUMPECTOMY WITH RADIOACTIVE SEED LOCALIZATION;  Surgeon: HExcell Seltzer MD;  [facility]  Service: General;  Laterality: Left;  . CATARACT EXTRACTION W/PHACO Left 10/08/2019   Procedure: CATARACT EXTRACTION PHACO AND INTRAOCULAR LENS PLACEMENT (IOC) LEFT 3.47, 00:46.0,  18.8%;  Surgeon: BLeandrew Koyanagi MD;  [facility]  Service: Ophthalmology;  Laterality: Left;  Diabetes - oral meds  . COLONOSCOPY    . COLONOSCOPY WITH PROPOFOL N/A 05/14/2018   Procedure: COLONOSCOPY WITH PROPOFOL;  Surgeon: AJonathon Bellows MD;  [facility];  Service: Gastroenterology;  Laterality: N/A;  . ORIF WRIST FRACTURE Right 06/15/2017   Procedure: OPEN REDUCTION INTERNAL FIXATION (ORIF) WRIST FRACTURE WITH REDUCTION AND PINNINGT OF CARPAL DISLOCATION;  Surgeon: TMilly Jakob MD;  [facility]  Service: Orthopedics;  Laterality: Right;  . RADIOACTIVE SEED GUIDED PARTIAL MASTECTOMY WITH AXILLARY SENTINEL LYMPH NODE BIOPSY Left 02/08/2015   Procedure: RADIOACTIVE SEED LOCALIZATION LUMPECTOMY WITH LEFT AXILLARY SENTINEL LYMPH NODE BIOPSY;  Surgeon: BExcell Seltzer MD;  [facility]  Service: General;  Laterality: Left;  . SHOULDER ARTHROSCOPY  2012   left  . TONSILLECTOMY     Social History   Social History  . Marital status: Married    Spouse name: N/A  . Number of children: 3   Occupational History  . retired   Social History Main  Topics  . Smoking status: Never Smoker  . Smokeless tobacco: Never Used  . Alcohol use No  . Drug use: No   Current Outpatient Medications on File Prior to Visit  Medication Sig Dispense Refill  . Biotin 1000 MCG tablet Take 1,000 mcg by mouth daily.    . Blood Glucose Monitoring Suppl (ONE TOUCH ULTRA 2) w/Device KIT USE AS DIRECTED    . cetirizine (ZYRTEC) 10 MG tablet Take 10 mg by mouth.    . Cholecalciferol (VITAMIN D) 2000 units tablet Take by mouth.    . diphenhydrAMINE (BENADRYL) 25 MG tablet Take 25 mg by mouth 2 (two) times daily.    . Garlic 12426MG CAPS Take by mouth.    .Marland Kitchenglucosamine-chondroitin 500-400 MG tablet Take 1 tablet by mouth daily.    . Lancets (ONETOUCH DELICA PLUS [id number] MISC USE TO CHECK BLOOD SUGAR  DAILY 100 each 3  . metFORMIN (GLUCOPHAGE-XR) 500 MG 24 hr tablet TAKE 1 TABLET BY MOUTH  TWICE DAILY 180 tablet 3  . Multiple Vitamins-Minerals (MULTIVITAMIN WOMEN 50+ PO) Take 1 tablet by  mouth daily.    . Omega-3 1000 MG CAPS Take by mouth.    Glory Rosebush ULTRA test strip USE ONCE DAILY. 100 strip 3  . pravastatin (PRAVACHOL) 20 MG tablet Take 1 tablet (20 mg total) by mouth daily. 90 tablet 3  . tamoxifen (NOLVADEX) 20 MG tablet Take 1 tablet (20 mg total) by mouth daily. 90 tablet 4  . vitamin B-12 (CYANOCOBALAMIN) 1000 MCG tablet Take 1,000 mcg by mouth daily. dissovable     No current facility-administered medications on file prior to visit.   Allergies  Allergen Reactions  . Oxycontin [Oxycodone Hcl] Shortness Of Breath  . Sulfa Antibiotics Itching   Family history: - DM in sister, MGM.  PE: There were no vitals taken for this visit. Wt Readings from Last 3 Encounters:  05/31/20 175 lb 12 oz (79.7 kg)  05/24/20 179 lb (81.2 kg)  05/21/20 175 lb 12 oz (79.7 kg)   Constitutional: overweight, in NAD Eyes: PERRLA, EOMI, no exophthalmos ENT: moist mucous membranes, no thyromegaly, no cervical lymphadenopathy Cardiovascular: RRR, No  MRG Respiratory: CTA B Gastrointestinal: abdomen soft, NT, ND, BS+ Musculoskeletal: no deformities, strength intact in all 4 Skin: moist, warm, no rashes Neurological: no tremor with outstretched hands, DTR normal in all 4  ASSESSMENT: 1. DM2, non-insulin-dependent, controlled, without long-term complications  2. HL  3. PN  PLAN:  1. Pt with well-controlled type 2 diabetes, on very low-dose metformin ER daily.  She had bloating when she was taking metformin in the morning.  Sugars are usually well controlled except for the times when she had intraspinal steroid injections every 6 months.  Latest HbA1c was excellent, at 6.2% and 6 months ago.  At that time, sugars were at goal with only 2 prior values, after a steroid injection.  At that time, she was not planning to have any more injections. Her left knee Is turning inwards and is very painful, and at last visit she was working with orthopedics to get the prosthesis.  She did not want to undergo surgery.  - I suggested to:  Patient Instructions  Please continue Metformin ER 500 mg with dinner.  Try to schedule a new eye exam.  Please return in 6 months with your sugar log.  - we checked her HbA1c: 7%  - advised to check sugars at different times of the day - 1x a day, rotating check times - advised for yearly eye exams >> she is UTD - return to clinic in 70month  2. HL -Reviewed latest lipid panel: All fractions at goal Lab Results  Component Value Date   CHOL 148 05/24/2020   HDL 44.40 05/24/2020   LDLCALC 85 05/24/2020   TRIG 94.0 05/24/2020   CHOLHDL 3 05/24/2020  -She was not on a statin, only on omega-3 fatty acids and guarding. At last visit that suggested pravastatin 20 mg daily.  3. PN -In the past, she was on alpha-lipoic acid and B complex, now off -Her numbness and tingling almost resolved    CPhilemon Kingdom MD PhD [facility]

## 2020-11-24 ENCOUNTER — Ambulatory Visit: Payer: Medicare Other | Admitting: Internal Medicine

## 2020-12-17 ENCOUNTER — Ambulatory Visit: Payer: Medicare Other | Admitting: Internal Medicine

## 2020-12-17 NOTE — Progress Notes (Incomplete)
Patient ID: Gabriela [name], female   DOB: 03/03/1946, 75 y.o.   MRN: [id number]   This visit occurred during the SARS-CoV-2 public health emergency.  Safety protocols were in place, including screening questions prior to the visit, additional usage of staff PPE, and extensive cleaning of exam room while observing appropriate contact time as indicated for disinfecting solutions.   HPI: Gabriela [name] is a 75 y.o.-year-old female, returning for f/u for DM2, dx in 04/2014, non-insulin-dependent, controlled, without long-term complications. Last visit 6 months ago.  Reviewed HbA1c levels: Lab Results  Component Value Date   HGBA1C 6.2 (A) 05/24/2020   HGBA1C 6.1 (A) 11/24/2019   HGBA1C 5.9 (A) 05/22/2019   HGBA1C 5.9 (A) 11/19/2018   HGBA1C 6.4 04/08/2018   HGBA1C 6 11/16/2017   HGBA1C 5.9 05/16/2017   HGBA1C 5.8 12/27/2016  08/08/2016: HbA1c 7.0% 02/04/2016: HbA1c 6.7% 05/12/2015: HbA1c 6.3% 11/17/2014: HbA1c 6.1% 08/07/2014: HbA1c 6.4%  Pt is on: - Metformin ER 500 mg 2x a day >> 500 mg with dinner-she could not tolerate a.m. doses due to bloating She could not tolerate Metformin IR: HA, reflux, borborygmi  Pt checks her sugars once a day: - am: 130s-160, 170 >> n/c  - 2h after b'fast: 100-110 >> 93-110 >> 108-118, 132, 148 (steroid inj) - before lunch: n/c  - 2h after lunch: 90-112 >> 94-118 >> 109-125, 151 (steroid inj) - before dinner: n/c - 2h after dinner: 90-126 >> 93-115 >> 86-121 >> 107-128 - bedtime: n/c - nighttime: n/c Lowest sugar was 86 >> 107; it is unclear at which level she has hypoglycemia awareness. Highest sugar was 201 (steroid inj) >>...121 >> 151.  Glucometer: One Touch Ultra  Pt's meals are: - Breakfast: multi-grain sandwich with egg salad - Lunch: may skip; multi-grain crackers with salad or homemade pimento cheese - Dinner: grilled or broiled meat and vegetable - Snacks: apple/pear; almonds/cashews  No CKD, last BUN/creatinine:  Lab Results   Component Value Date   BUN 19 04/13/2020   BUN 14 04/14/2019   CREATININE 0.92 04/13/2020   CREATININE 0.83 04/14/2019   + HL;  last set of lipids: Lab Results  Component Value Date   CHOL 148 05/24/2020   HDL 44.40 05/24/2020   LDLCALC 85 05/24/2020   TRIG 94.0 05/24/2020   CHOLHDL 3 05/24/2020  02/04/2016: 181/84/43/121 At last visit, I suggested pravastatin 20 mg daily.  She is also on omega-3 fatty acids 1200 mg twice a day and garlic. - last eye exam was 02/2019: No DR, + cataract. -+ Improved numbness and tingling in her feet.  Previously on alpha-lipoic acid and B complex.  She was also on B12 injections in the past, now takes this p.o.  She has a history of subdural hematoma 6 years ago after an accident when she was working at [employer]. Since then, she has hand tremors. In 2016 she has been dx'ed with BrCA and also had a total hysterectomy and a pelvic repair surgery.   She likes to work in her garden.  Husband dx'ed with colon cancer - had colectomy at the beginning of 2020, and he had 2 AMIs Spring 2019.  ROS: Constitutional: no weight gain/no weight loss, no fatigue, no subjective hyperthermia, no subjective hypothermia Eyes: no blurry vision, no xerophthalmia ENT: no sore throat, no nodules palpated in neck, no dysphagia, no odynophagia, no hoarseness Cardiovascular: no CP/no SOB/no palpitations/no leg swelling Respiratory: no cough/no SOB/no wheezing Gastrointestinal: no N/no V/no D/no C/no acid reflux Musculoskeletal: no muscle  aches/+ joint aches (left knee) Skin: no rashes, no hair loss Neurological: no tremors/no numbness/no tingling/no dizziness  I reviewed pt's medications, allergies, PMH, social hx, family hx, and changes were documented in the history of present illness. Otherwise, unchanged from my initial visit note.  Past Medical History:  Diagnosis Date  . Arthritis   . Cancer ([facility])    left breast  . Chronic pain    from fall 2012  . Diabetes  mellitus without complication (H[facility])    diet controlled  . History of closed head injury 2012   accident work threw her 76f  . Hyperlipemia   . Personal history of radiation therapy   . Spinal stenosis   . Urinary incontinence    wears a pessery   Past Surgical History:  Procedure Laterality Date  . ABDOMINAL HYSTERECTOMY    . BREAST LUMPECTOMY  1999   left  . BREAST LUMPECTOMY Left 2016  . BREAST LUMPECTOMY WITH RADIOACTIVE SEED LOCALIZATION Left 03/08/2018   Procedure: LEFT BREAST LUMPECTOMY WITH RADIOACTIVE SEED LOCALIZATION;  Surgeon: HExcell Seltzer MD;  [place] [facility]  Service: General;  Laterality: Left;  . CATARACT EXTRACTION W/PHACO Left 10/08/2019   Procedure: CATARACT EXTRACTION PHACO AND INTRAOCULAR LENS PLACEMENT (IOC) LEFT 3.47, 00:46.0,  18.8%;  Surgeon: BLeandrew Koyanagi MD;  [facility] [facility]  Service: Ophthalmology;  Laterality: Left;  Diabetes - oral meds  . COLONOSCOPY    . COLONOSCOPY WITH PROPOFOL N/A 05/14/2018   Procedure: COLONOSCOPY WITH PROPOFOL;  Surgeon: AJonathon Bellows MD;  [facility]  Service: Gastroenterology;  Laterality: N/A;  . ORIF WRIST FRACTURE Right 06/15/2017   Procedure: OPEN REDUCTION INTERNAL FIXATION (ORIF) WRIST FRACTURE WITH REDUCTION AND PINNINGT OF CARPAL DISLOCATION;  Surgeon: TMilly Jakob MD;  [place] [facility]  Service: Orthopedics;  Laterality: Right;  . RADIOACTIVE SEED GUIDED PARTIAL MASTECTOMY WITH AXILLARY SENTINEL LYMPH NODE BIOPSY Left 02/08/2015   Procedure: RADIOACTIVE SEED LOCALIZATION LUMPECTOMY WITH LEFT AXILLARY SENTINEL LYMPH NODE BIOPSY;  Surgeon: BExcell Seltzer MD;  [facility] [facility]  Service: General;  Laterality: Left;  . SHOULDER ARTHROSCOPY  2012   left  . TONSILLECTOMY     Social History   Social History  . Marital status: Married    Spouse name: N/A  . Number of children: 3   Occupational History  . retired   Social History Main  Topics  . Smoking status: Never Smoker  . Smokeless tobacco: Never Used  . Alcohol use No  . Drug use: No   Current Outpatient Medications on File Prior to Visit  Medication Sig Dispense Refill  . Biotin 1000 MCG tablet Take 1,000 mcg by mouth daily.    . Blood Glucose Monitoring Suppl (ONE TOUCH ULTRA 2) w/Device KIT USE AS DIRECTED    . cetirizine (ZYRTEC) 10 MG tablet Take 10 mg by mouth.    . Cholecalciferol (VITAMIN D) 2000 units tablet Take by mouth.    . diphenhydrAMINE (BENADRYL) 25 MG tablet Take 25 mg by mouth 2 (two) times daily.    . Garlic 16948MG CAPS Take by mouth.    .Marland Kitchenglucosamine-chondroitin 500-400 MG tablet Take 1 tablet by mouth daily.    . Lancets (ONETOUCH DELICA PLUS [id number] MISC USE TO CHECK BLOOD SUGAR  DAILY 100 each 3  . metFORMIN (GLUCOPHAGE-XR) 500 MG 24 hr tablet TAKE 1 TABLET BY MOUTH  TWICE DAILY 180 tablet 3  . Multiple Vitamins-Minerals (MULTIVITAMIN WOMEN 50+ PO) Take 1 tablet by  mouth daily.    . Omega-3 1000 MG CAPS Take by mouth.    Glory Rosebush ULTRA test strip USE ONCE DAILY. 100 strip 3  . pravastatin (PRAVACHOL) 20 MG tablet Take 1 tablet (20 mg total) by mouth daily. 90 tablet 3  . tamoxifen (NOLVADEX) 20 MG tablet Take 1 tablet (20 mg total) by mouth daily. 90 tablet 4  . vitamin B-12 (CYANOCOBALAMIN) 1000 MCG tablet Take 1,000 mcg by mouth daily. dissovable     No current facility-administered medications on file prior to visit.   Allergies  Allergen Reactions  . Oxycontin [Oxycodone Hcl] Shortness Of Breath  . Sulfa Antibiotics Itching   Family history: - DM in sister, MGM.  PE: There were no vitals taken for this visit. Wt Readings from Last 3 Encounters:  05/31/20 175 lb 12 oz (79.7 kg)  05/24/20 179 lb (81.2 kg)  05/21/20 175 lb 12 oz (79.7 kg)   Constitutional: overweight, in NAD Eyes: PERRLA, EOMI, no exophthalmos ENT: moist mucous membranes, no thyromegaly, no cervical lymphadenopathy Cardiovascular: RRR, No  MRG Respiratory: CTA B Gastrointestinal: abdomen soft, NT, ND, BS+ Musculoskeletal: no deformities, strength intact in all 4 Skin: moist, warm, no rashes Neurological: no tremor with outstretched hands, DTR normal in all 4  ASSESSMENT: 1. DM2, non-insulin-dependent, controlled, without long-term complications  2. HL  3. PN  PLAN:  1. Pt with well-controlled type 2 diabetes, on very low-dose metformin ER daily.  She had bloating when she was taking metformin in the morning.  Sugars are usually well controlled except for the times when she had intraspinal steroid injections every 6 months.  Latest HbA1c was excellent, at 6.2% and 6 months ago.  At that time, sugars were at goal with only 2 prior values, after a steroid injection.  At that time, she was not planning to have any more injections. Her left knee Is turning inwards and is very painful, and at last visit she was working with orthopedics to get the prosthesis.  She did not want to undergo surgery.  - I suggested to:  Patient Instructions  Please continue Metformin ER 500 mg with dinner.  Try to schedule a new eye exam.  Please return in 6 months with your sugar log.  - we checked her HbA1c: 7%  - advised to check sugars at different times of the day - 1x a day, rotating check times - advised for yearly eye exams >> she is UTD - return to clinic in 68month  2. HL -Reviewed latest lipid panel: All fractions at goal Lab Results  Component Value Date   CHOL 148 05/24/2020   HDL 44.40 05/24/2020   LDLCALC 85 05/24/2020   TRIG 94.0 05/24/2020   CHOLHDL 3 05/24/2020  -She was not on a statin, only on omega-3 fatty acids and guarding. At last visit that suggested pravastatin 20 mg daily.  3. PN -In the past, she was on alpha-lipoic acid and B complex, now off -Her numbness and tingling almost resolved  CPhilemon Kingdom MD PhD L[facility]

## 2020-12-30 ENCOUNTER — Ambulatory Visit: Payer: Medicare Other | Admitting: Family Medicine

## 2020-12-30 DIAGNOSIS — Z0289 Encounter for other administrative examinations: Secondary | ICD-10-CM

## 2021-01-05 ENCOUNTER — Ambulatory Visit (INDEPENDENT_AMBULATORY_CARE_PROVIDER_SITE_OTHER): Payer: Medicare Other | Admitting: Family Medicine

## 2021-01-05 ENCOUNTER — Other Ambulatory Visit: Payer: Self-pay

## 2021-01-05 ENCOUNTER — Encounter: Payer: Self-pay | Admitting: Family Medicine

## 2021-01-05 VITALS — BP 100/60 | HR 91 | Temp 98.0°F | Ht 66.5 in | Wt 173.8 lb

## 2021-01-05 DIAGNOSIS — M7501 Adhesive capsulitis of right shoulder: Secondary | ICD-10-CM | POA: Diagnosis not present

## 2021-01-05 NOTE — Progress Notes (Signed)
Gabriela Simien T. Cheyenna [name], MD, [facility] [place] [facility] [place], [address]  Phone: [phone]  FAX: [place] - 75 y.o. female  MRN [id number]  Date of Birth: Jan 08, 1946  Date: 01/05/2021  PCP: [name], FNP (Inactive)  Referral: No ref. provider found  Chief Complaint  Patient presents with  . Shoulder Pain    Right-Around to shoulder blade    This visit occurred during the SARS-CoV-2 public health emergency.  Safety protocols were in place, including screening questions prior to the visit, additional usage of staff PPE, and extensive cleaning of exam room while observing appropriate contact time as indicated for disinfecting solutions.   Subjective:   Gabriela [name] is a 75 y.o. very pleasant female patient who presents with the following: shoulder pain  The patient noted above presents with shoulder pain that has been ongoing for 4-5 weeks. there is no history of trauma or accident. The patient denies neck pain or radicular symptoms. + some mild shoulder blade pain Denies dislocation, subluxation, separation of the shoulder. The patient does complain of pain with flexion, abduction, and terminal motion.  Significant restriction of motion. she describes a deep ache around the shoulder, and sometimes it will wake the patient up at night.  No prior significant trauma On the l shoulder did have a minor injury years ago  Medications Tried: OTC NSAIDS Ice or Heat: minimal help Tried PT: No  Prior shoulder Injury: No Prior surgery: No Prior fracture: No   Review of Systems is noted in the HPI, as appropriate  Objective:   Blood pressure 100/60, pulse 91, temperature 98 F (36.7 C), temperature source Temporal, height 5' 6.5" (1.689 m), weight 173 lb 12 oz (78.8 kg), SpO2 98 %.   CERVICAL SPINE EXAM Range of motion: Flexion, extension, lateral  bending, and rotation: none Pain with terminal motion: no Spinous Processes: NT SCM: NT Upper paracervical muscles: nt Upper traps: ttp and mid trap C5-T1 intact, sensation and motor   Shoulder: R and L Inspection: No muscle wasting or winging Ecchymosis/edema: neg  AC joint, scapula, clavicle: NT Cervical spine: NT, full ROM Spurling's: neg ABNORMAL SIDE TESTED: R UNLESS OTHERWISE NOTED, THE CONTRALATERAL SIDE HAS FULL RANGE OF MOTION. Abduction: 5/5, LIMITED TO 160 DEGREES Flexion: 5/5, LIMITED TO 160 DEGNO ROM  IR, lift-off: 5/5. TESTED AT 90 DEGREES OF ABDUCTION, LIMITED TO 15 DEGREES, approx 30 deg difference complared to the contralateral side ER at neutral:  5/5, TESTED AT 90 DEGREES OF ABDUCTION, LIMITED TO 60 DEGREES AC crossover and compression: PAIN Drop Test: neg Empty Can: neg Supraspinatus insertion: NT Bicipital groove: NT ALL OTHER SPECIAL TESTING EQUIVOCAL GIVEN LOSS OF MOTION C5-T1 intact Sensation intact Grip 5/5     Assessment and Plan:     ICD-10-CM   1. Adhesive capsulitis of right shoulder  M75.01    New onset  And caught early.  Hopefully with good HEP motion will return. DM increases risk.  Patient was given a systematic ROM protocol from [facility] to be done daily.  The average length of total symptoms is 12 months going through 3 different phases in the freezing and thawing process. There can be a permanent loss of motion in some cases.   Intraarticular shoulder injections discussed with patient, which have good evidence for accelerating the thawing phase, but will hold unless she worsens.  Patient will be sent for  formal PT for aggressive frozen shoulder ROM if sx persist. Will need RTC str and scapular stabilization to fix underlying mechanics.  F/u 2 months if no improvement or worsening  Social: impairing exercise somewhat  Signed,  Frederico Hamman T. Aneisa Karren, MD   Outpatient Encounter Medications as of 01/05/2021  Medication Sig  .  Biotin 1000 MCG tablet Take 1,000 mcg by mouth daily.  . Blood Glucose Monitoring Suppl (ONE TOUCH ULTRA 2) w/Device KIT USE AS DIRECTED  . cetirizine (ZYRTEC) 10 MG tablet Take 10 mg by mouth.  . Cholecalciferol (VITAMIN D) 2000 units tablet Take by mouth.  . diphenhydrAMINE (BENADRYL) 25 MG tablet Take 25 mg by mouth 2 (two) times daily.  . Garlic 8338 MG CAPS Take by mouth.  Marland Kitchen glucosamine-chondroitin 500-400 MG tablet Take 1 tablet by mouth daily.  . Lancets (ONETOUCH DELICA PLUS [id number]) MISC USE TO CHECK BLOOD SUGAR  DAILY  . metFORMIN (GLUCOPHAGE-XR) 500 MG 24 hr tablet TAKE 1 TABLET BY MOUTH  TWICE DAILY  . Multiple Vitamins-Minerals (MULTIVITAMIN WOMEN 50+ PO) Take 1 tablet by mouth daily.  . Omega-3 1000 MG CAPS Take by mouth.  Glory Rosebush ULTRA test strip USE ONCE DAILY.  . pravastatin (PRAVACHOL) 20 MG tablet Take 1 tablet (20 mg total) by mouth daily.  . tamoxifen (NOLVADEX) 20 MG tablet Take 1 tablet (20 mg total) by mouth daily.  . vitamin B-12 (CYANOCOBALAMIN) 1000 MCG tablet Take 1,000 mcg by mouth daily. dissovable   No facility-administered encounter medications on file as of 01/05/2021.

## 2021-02-02 ENCOUNTER — Encounter: Payer: Medicare Other | Admitting: Family Medicine

## 2021-02-04 ENCOUNTER — Other Ambulatory Visit: Payer: Self-pay

## 2021-02-04 ENCOUNTER — Encounter: Payer: Self-pay | Admitting: Internal Medicine

## 2021-02-04 ENCOUNTER — Ambulatory Visit: Payer: Medicare Other | Admitting: Internal Medicine

## 2021-02-04 VITALS — BP 120/78 | HR 80 | Ht 66.5 in | Wt 175.6 lb

## 2021-02-04 DIAGNOSIS — G629 Polyneuropathy, unspecified: Secondary | ICD-10-CM | POA: Diagnosis not present

## 2021-02-04 DIAGNOSIS — E119 Type 2 diabetes mellitus without complications: Secondary | ICD-10-CM | POA: Diagnosis not present

## 2021-02-04 DIAGNOSIS — E785 Hyperlipidemia, unspecified: Secondary | ICD-10-CM

## 2021-02-04 LAB — LIPID PANEL
Cholesterol: 147 mg/dL (ref 0–200)
HDL: 55.3 mg/dL (ref >39.00)
LDL Cholesterol: 71 mg/dL (ref 0–99)
NonHDL: 92.01
Total CHOL/HDL Ratio: 3
Triglycerides: 107 mg/dL (ref 0.0–149.0)
VLDL: 21.4 mg/dL (ref 0.0–40.0)

## 2021-02-04 NOTE — Addendum Note (Signed)
Addended by: Boris Lown B on: 02/04/2021 04:28 PM   Modules accepted: Orders

## 2021-02-04 NOTE — Patient Instructions (Signed)
Please continue Metformin ER 500 mg 2x a day.  Please stop at the lab.  Please return in 6 months with your sugar log.

## 2021-02-04 NOTE — Progress Notes (Addendum)
Patient ID: Gabriela [name], female   DOB: 1946/01/04, 75 y.o.   MRN: [id number]   This visit occurred during the SARS-CoV-2 public health emergency.  Safety protocols were in place, including screening questions prior to the visit, additional usage of staff PPE, and extensive cleaning of exam room while observing appropriate contact time as indicated for disinfecting solutions.   HPI: Gabriela [name] is a 75 y.o.-year-old female, returning for f/u for DM2, dx in 04/2014, non-insulin-dependent, controlled, without long-term complications. Last visit 6 months ago.  Interim history: She stays very active. She has knee pain >> takes an arthritis tablet 2x a day >> helps a lot. She has no complaints at this visit.  No blurry vision, no increased urination, no unintended weight loss or weight gain.  Reviewed HbA1c levels: Lab Results  Component Value Date   HGBA1C 6.2 (A) 05/24/2020   HGBA1C 6.1 (A) 11/24/2019   HGBA1C 5.9 (A) 05/22/2019   HGBA1C 5.9 (A) 11/19/2018   HGBA1C 6.4 04/08/2018   HGBA1C 6 11/16/2017   HGBA1C 5.9 05/16/2017   HGBA1C 5.8 12/27/2016  08/08/2016: HbA1c 7.0% 02/04/2016: HbA1c 6.7% 05/12/2015: HbA1c 6.3% 11/17/2014: HbA1c 6.1% 08/07/2014: HbA1c 6.4%  Pt is on: - Metformin ER 500 mg with dinner (had gas when she was taking it in the morning) >> since last visit she was able to increase it to 500 mg 2x a day - no GI sxs She could not tolerate Metformin IR: HA, reflux, borborygmi  Pt checks her sugars once a day per review of her excellent log: - am: 130s-160, 170 >> n/c  - 2h after b'fast: 100-110 >> 93-110 >> 108-118, 132, 148 (steroid inj) >> 95-120 - before lunch: n/c  - 2h after lunch:  90-112 >> 94-118 >> 109-125, 151 (steroid inj) >> 89-122 - before dinner: n/c - 2h after dinner: 90-126 >> 93-115 >> 86-121 >> 107-128 >> 96-120 - bedtime: n/c - nighttime: n/c Lowest sugar was 87 >> 90 >> 86 >> 107 >> 89; it is unclear at which level she has hypoglycemia  awareness. Highest sugar was 201 (steroid inj) >> 126 >> 115 >> 121 >> 151 >> 122.  Glucometer: One Touch Ultra  Pt's meals are: - Breakfast: multi-grain sandwich with egg salad - Lunch: may skip; multi-grain crackers with salad or homemade pimento cheese - Dinner: grilled or broiled meat and vegetable - Snacks: apple/pear; almonds/cashews  No CKD, last BUN/creatinine:  Lab Results  Component Value Date   BUN 19 04/13/2020   BUN 14 04/14/2019   CREATININE 0.92 04/13/2020   CREATININE 0.83 04/14/2019   + HL;  last set of lipids: Lab Results  Component Value Date   CHOL 148 05/24/2020   HDL 44.40 05/24/2020   LDLCALC 85 05/24/2020   TRIG 94.0 05/24/2020   CHOLHDL 3 05/24/2020  02/04/2016: 181/84/43/121 We started pravastatin 20 mg daily at last visit.  She takes omega-3 fatty acids 1200 mg twice a day and also garlic. - last eye exam was 06/03/2020: No DR, + cataract.   -She has improved numbness and tingling in her feet.  Previously on alpha-lipoic acid and B complex.  She was on B12 injections, now on p.o. B12  She has a history of subdural hematoma 6 years ago after an accident when she was working at [employer]. Since then, she has hand tremors. In 2016 she has been dx'ed with BrCA and also had a total hysterectomy and a pelvic repair surgery.   She likes to work  in her garden.  Husband dx'ed with colon cancer - had colectomy at the beginning of 2020, and he had 2 AMIs Spring 2019.  ROS: Constitutional: no weight gain/no weight loss, no fatigue, no subjective hyperthermia, no subjective hypothermia Eyes: no blurry vision, no xerophthalmia ENT: no sore throat, no nodules palpated in neck, no dysphagia, no odynophagia, no hoarseness Cardiovascular: no CP/no SOB/no palpitations/no leg swelling Respiratory: no cough/no SOB/no wheezing Gastrointestinal: no N/no V/no D/no C/no acid reflux Musculoskeletal: no muscle aches/+ L knee pain  Skin: no rashes, no hair loss Neurological:  no tremors/no numbness/no tingling/no dizziness  I reviewed pt's medications, allergies, PMH, social hx, family hx, and changes were documented in the history of present illness. Otherwise, unchanged from my initial visit note.  Past Medical History:  Diagnosis Date  . Arthritis   . Cancer ([place])    left breast  . Chronic pain    from fall 2012  . Diabetes mellitus without complication (HCC)    diet controlled  . History of closed head injury 2012   accident work threw her 53f  . Hyperlipemia   . Personal history of radiation therapy   . Spinal stenosis   . Urinary incontinence    wears a pessery   Past Surgical History:  Procedure Laterality Date  . ABDOMINAL HYSTERECTOMY    . BREAST LUMPECTOMY  1999   left  . BREAST LUMPECTOMY Left 2016  . BREAST LUMPECTOMY WITH RADIOACTIVE SEED LOCALIZATION Left 03/08/2018   Procedure: LEFT BREAST LUMPECTOMY WITH RADIOACTIVE SEED LOCALIZATION;  Surgeon: HExcell Seltzer MD;  [facility]  Service: General;  Laterality: Left;  . CATARACT EXTRACTION W/PHACO Left 10/08/2019   Procedure: CATARACT EXTRACTION PHACO AND INTRAOCULAR LENS PLACEMENT (IOC) LEFT 3.47, 00:46.0,  18.8%;  Surgeon: BLeandrew Koyanagi MD;  [facility]  Service: Ophthalmology;  Laterality: Left;  Diabetes - oral meds  . COLONOSCOPY    . COLONOSCOPY WITH PROPOFOL N/A 05/14/2018   Procedure: COLONOSCOPY WITH PROPOFOL;  Surgeon: AJonathon Bellows MD;  [facility];  Service: Gastroenterology;  Laterality: N/A;  . ORIF WRIST FRACTURE Right 06/15/2017   Procedure: OPEN REDUCTION INTERNAL FIXATION (ORIF) WRIST FRACTURE WITH REDUCTION AND PINNINGT OF CARPAL DISLOCATION;  Surgeon: TMilly Jakob MD;  [facility]  Service: Orthopedics;  Laterality: Right;  . RADIOACTIVE SEED GUIDED PARTIAL MASTECTOMY WITH AXILLARY SENTINEL LYMPH NODE BIOPSY Left 02/08/2015   Procedure: RADIOACTIVE SEED LOCALIZATION LUMPECTOMY WITH LEFT AXILLARY  SENTINEL LYMPH NODE BIOPSY;  Surgeon: BExcell Seltzer MD;  [facility]  Service: General;  Laterality: Left;  . SHOULDER ARTHROSCOPY  2012   left  . TONSILLECTOMY     Social History   Social History  . Marital status: Married    Spouse name: N/A  . Number of children: 3   Occupational History  . retired   Social History Main Topics  . Smoking status: Never Smoker  . Smokeless tobacco: Never Used  . Alcohol use No  . Drug use: No   Current Outpatient Medications on File Prior to Visit  Medication Sig Dispense Refill  . Biotin 1000 MCG tablet Take 1,000 mcg by mouth daily.    . Blood Glucose Monitoring Suppl (ONE TOUCH ULTRA 2) w/Device KIT USE AS DIRECTED    . cetirizine (ZYRTEC) 10 MG tablet Take 10 mg by mouth.    . Cholecalciferol (VITAMIN D) 2000 units tablet Take by mouth.    . diphenhydrAMINE (BENADRYL) 25 MG tablet Take 25  mg by mouth 2 (two) times daily.    . Garlic 8299 MG CAPS Take by mouth.    Marland Kitchen glucosamine-chondroitin 500-400 MG tablet Take 1 tablet by mouth daily.    . Lancets (ONETOUCH DELICA PLUS [id number]) MISC USE TO CHECK BLOOD SUGAR  DAILY 100 each 3  . metFORMIN (GLUCOPHAGE-XR) 500 MG 24 hr tablet TAKE 1 TABLET BY MOUTH  TWICE DAILY 180 tablet 3  . Multiple Vitamins-Minerals (MULTIVITAMIN WOMEN 50+ PO) Take 1 tablet by mouth daily.    . Omega-3 1000 MG CAPS Take by mouth.    Glory Rosebush ULTRA test strip USE ONCE DAILY. 100 strip 3  . pravastatin (PRAVACHOL) 20 MG tablet Take 1 tablet (20 mg total) by mouth daily. 90 tablet 3  . tamoxifen (NOLVADEX) 20 MG tablet Take 1 tablet (20 mg total) by mouth daily. 90 tablet 4  . vitamin B-12 (CYANOCOBALAMIN) 1000 MCG tablet Take 1,000 mcg by mouth daily. dissovable     No current facility-administered medications on file prior to visit.   Allergies  Allergen Reactions  . Oxycontin [Oxycodone Hcl] Shortness Of Breath  . Sulfa Antibiotics Itching   Family history: - DM in sister,  MGM.  PE: BP 120/78 (BP Location: Right Arm, Patient Position: Sitting, Cuff Size: Normal)   Pulse 80   Ht 5' 6.5" (1.689 m)   Wt 175 lb 9.6 oz (79.7 kg)   SpO2 98%   BMI 27.92 kg/m  Wt Readings from Last 3 Encounters:  02/04/21 175 lb 9.6 oz (79.7 kg)  01/05/21 173 lb 12 oz (78.8 kg)  05/31/20 175 lb 12 oz (79.7 kg)   Constitutional: overweight, in NAD Eyes: PERRLA, EOMI, no exophthalmos ENT: moist mucous membranes, no thyromegaly, no cervical lymphadenopathy Cardiovascular: RRR, No MRG Respiratory: CTA B Gastrointestinal: abdomen soft, NT, ND, BS+ Musculoskeletal: no deformities, strength intact in all 4 Skin: moist, warm, no rashes Neurological: no tremor with outstretched hands, DTR normal in all 4  ASSESSMENT: 1. DM2, non-insulin-dependent, controlled, without long-term complications  2. HL  3. PN  PLAN:  1. Pt with well-controlled type 2 diabetes on low-dose Metformin ER at night.  She could not tolerate Metformin in the morning due to bloating.  Her sugars are usually very well controlled and her HbA1c levels are at goal.  Last visit, HbA1c was 6.2%, slightly higher.  At that time, her sugars were at goal with the exception of 2 higher values after a steroid injection in left knee.  She did not get any more injections since last visit.  At last visit he described her left knee is turning inwards and is very painful and she was working with orthopedics to get a prosthesis.  She would not want to undergo surgery.  She discovered an arthritis supplement that helps a lot.  Therefore, she is more active. -At today's visit she tells me that she is actually taking Metformin ER twice a day and she is now tolerating well.  Sugars are all at goal.  For now, we will continue the current regimen. - I suggested to:  Patient Instructions  Please continue Metformin ER 500 mg 2x a day.  Please stop at the lab.  Please return in 6 months with your sugar log.  - we checked her HbA1c:  6.2% (stable) - advised to check sugars at different times of the day - 1x a day, rotating check times - advised for yearly eye exams >> she is UTD - will check annual labs today -  return to clinic in 6 months  2. HL -Regulated lipid panel from 05/2020: Fractions at goal: Lab Results  Component Value Date   CHOL 148 05/24/2020   HDL 44.40 05/24/2020   LDLCALC 85 05/24/2020   TRIG 94.0 05/24/2020   CHOLHDL 3 05/24/2020  -She is on omega-3 fatty acids and garlic.  At last visit I suggested pravastatin 20 mg daily for cardiovascular protection.  She is now taking this and tolerates it well -We will check lipid panel today (nonfasting)  3. PN -In the past, she was on alpha-lipoic acid and B complex, now off -Previously on B12 injections -Numbness and tingling are now resolved.  Component     Latest Ref Rng & Units 02/04/2021  Glucose     65 - 99 mg/dL 112 (H)  BUN     7 - 25 mg/dL 18  Creatinine     0.60 - 0.93 mg/dL 0.85  GFR, Est Non African American     > OR = 60 mL/min/1.49m 68  GFR, Est African American     > OR = 60 mL/min/1.770m79  BUN/Creatinine Ratio     6 - 22 (calc) NOT APPLICABLE  Sodium     13979 146 mmol/L 136  Potassium     3.5 - 5.3 mmol/L 4.6  Chloride     98 - 110 mmol/L 100  CO2     20 - 32 mmol/L 26  Calcium     8.6 - 10.4 mg/dL 9.5  Total Protein     6.1 - 8.1 g/dL 6.8  Albumin MSPROF     3.6 - 5.1 g/dL 4.6  Globulin     1.9 - 3.7 g/dL (calc) 2.2  AG Ratio     1.0 - 2.5 (calc) 2.1  Total Bilirubin     0.2 - 1.2 mg/dL 0.5  Alkaline phosphatase (APISO)     37 - 153 U/L 20 (L)  AST     10 - 35 U/L 18  ALT     6 - 29 U/L 10  Cholesterol     0 - 200 mg/dL 147  Triglycerides     0.0 - 149.0 mg/dL 107.0  HDL Cholesterol     >39.00 mg/dL 55.30  VLDL     0.0 - 40.0 mg/dL 21.4  LDL (calc)     0 - 99 mg/dL 71  Total CHOL/HDL Ratio      3  NonHDL      92.01  Creatinine, Urine     20 - 275 mg/dL 88  Microalb, Ur     mg/dL 1.2   MICROALB/CREAT RATIO     <30 mcg/mg creat 14  Labs are normal with the exception of a low alkaline phosphatase.  She is already taking a multivitamin. Reviewed previous labs: Her alkaline phosphatase was consistently low.  We will need to check a phosphorus level next visit. Vitamin B12 was normal, as was her TSH.  LFTs were normal.  She is not on antiresorptive treatment.  Not taking extra vitamin D.  CrPhilemon KingdomMD PhD [facility]

## 2021-02-05 LAB — COMPLETE METABOLIC PANEL WITH GFR
AG Ratio: 2.1 (calc) (ref 1.0–2.5)
ALT: 10 U/L (ref 6–29)
AST: 18 U/L (ref 10–35)
Albumin: 4.6 g/dL (ref 3.6–5.1)
Alkaline phosphatase (APISO): 20 U/L — ABNORMAL LOW (ref 37–153)
BUN: 18 mg/dL (ref 7–25)
CO2: 26 mmol/L (ref 20–32)
Calcium: 9.5 mg/dL (ref 8.6–10.4)
Chloride: 100 mmol/L (ref 98–110)
Creat: 0.85 mg/dL (ref 0.60–0.93)
GFR, Est African American: 79 mL/min/{1.73_m2} (ref >=60)
GFR, Est Non African American: 68 mL/min/{1.73_m2} (ref >=60)
Globulin: 2.2 g/dL (calc) (ref 1.9–3.7)
Glucose, Bld: 112 mg/dL — ABNORMAL HIGH (ref 65–99)
Potassium: 4.6 mmol/L (ref 3.5–5.3)
Sodium: 136 mmol/L (ref 135–146)
Total Bilirubin: 0.5 mg/dL (ref 0.2–1.2)
Total Protein: 6.8 g/dL (ref 6.1–8.1)

## 2021-02-05 LAB — MICROALBUMIN / CREATININE URINE RATIO
Creatinine, Urine: 88 mg/dL (ref 20–275)
Microalb Creat Ratio: 14 mcg/mg creat (ref <30)
Microalb, Ur: 1.2 mg/dL

## 2021-02-09 ENCOUNTER — Encounter: Payer: Self-pay | Admitting: Family Medicine

## 2021-02-09 ENCOUNTER — Other Ambulatory Visit: Payer: Self-pay

## 2021-02-09 ENCOUNTER — Ambulatory Visit (INDEPENDENT_AMBULATORY_CARE_PROVIDER_SITE_OTHER): Payer: Medicare Other | Admitting: Family Medicine

## 2021-02-09 VITALS — BP 122/70 | HR 63 | Temp 97.9°F | Ht 67.0 in | Wt 176.5 lb

## 2021-02-09 DIAGNOSIS — Z853 Personal history of malignant neoplasm of breast: Secondary | ICD-10-CM

## 2021-02-09 DIAGNOSIS — E1169 Type 2 diabetes mellitus with other specified complication: Secondary | ICD-10-CM | POA: Diagnosis not present

## 2021-02-09 DIAGNOSIS — G3184 Mild cognitive impairment, so stated: Secondary | ICD-10-CM | POA: Diagnosis not present

## 2021-02-09 DIAGNOSIS — E119 Type 2 diabetes mellitus without complications: Secondary | ICD-10-CM

## 2021-02-09 DIAGNOSIS — E785 Hyperlipidemia, unspecified: Secondary | ICD-10-CM

## 2021-02-09 NOTE — Assessment & Plan Note (Signed)
Reviewed Dr. Virgie Dad note. She is on year 6/10 for tamoxifen as she elected to continue for 10 full years. Appreciate oncology support. Cont tamoxifen 20 mg.

## 2021-02-09 NOTE — Patient Instructions (Addendum)
  Call Dr. Manuella Ghazi to schedule a visit for your memory loss  Ray Church, MD  [place]  [facility]  [place], [place] [address]  Phone: [phone]  Fax: [phone]   I've also put in a referral for neurology

## 2021-02-09 NOTE — Assessment & Plan Note (Signed)
Cont pravastatin 20 mg. LDL 71 close to goal.   Lab Results  Component Value Date   CHOL 147 02/04/2021   HDL 55.30 02/04/2021   LDLCALC 71 02/04/2021   TRIG 107.0 02/04/2021   CHOLHDL 3 02/04/2021

## 2021-02-09 NOTE — Assessment & Plan Note (Signed)
Vit b12, thyroid, and labs normal within the last year. No depressive symptoms. [place] 23/30 with 0/5 word recall and -1 pt for date and was only able to list 4 words that begin with the letter "F" before giving up completely. Some memory loss but also lack of effort noted. She previously saw Dr. Manuella Ghazi with [facility] for neuropathy pain last year advised reaching out to his office for further evaluation. Advised against taking Prevagen

## 2021-02-09 NOTE — Assessment & Plan Note (Signed)
Lab Results  Component Value Date   HGBA1C 6.2 (A) 05/24/2020   Well controlled on metformin 500 mg BID. Cont medication and diabetic diet

## 2021-02-09 NOTE — Progress Notes (Signed)
Subjective:     Gabriela [name] is a 75 y.o. female presenting for Transitions Of Care and Memory Loss ("according to husband" )     HPI   #Memory loss - husband will give her a list of things to do and she will forget occasionally - has noticed that she is forgetful - her children have noticed she doesn't remember people or specific things - no getting lost when driving - has not left the stove or oven on  #Diabetes - her hemoglobin a1c has been well controlled for a few years   Review of Systems   Social History   Tobacco Use  Smoking Status Never Smoker  Smokeless Tobacco Never Used        Objective:    BP Readings from Last 3 Encounters:  02/09/21 122/70  02/04/21 120/78  01/05/21 100/60   Wt Readings from Last 3 Encounters:  02/09/21 176 lb 8 oz (80.1 kg)  02/04/21 175 lb 9.6 oz (79.7 kg)  01/05/21 173 lb 12 oz (78.8 kg)    BP 122/70   Pulse 63   Temp 97.9 F (36.6 C) (Temporal)   Ht 5\' 7"  (1.702 m)   Wt 176 lb 8 oz (80.1 kg)   SpO2 94%   BMI 27.64 kg/m    Physical Exam Constitutional:      General: She is not in acute distress.    Appearance: She is well-developed. She is not diaphoretic.  HENT:     Right Ear: External ear normal.     Left Ear: External ear normal.     Nose: Nose normal.  Eyes:     Conjunctiva/sclera: Conjunctivae normal.  Cardiovascular:     Rate and Rhythm: Normal rate.  Pulmonary:     Effort: Pulmonary effort is normal.  Musculoskeletal:     Cervical back: Neck supple.  Skin:    General: Skin is warm and dry.     Capillary Refill: Capillary refill takes less than 2 seconds.  Neurological:     Mental Status: She is alert. Mental status is at baseline.  Psychiatric:        Mood and Affect: Mood normal.        Behavior: Behavior normal.        Cognition and Memory: Memory is impaired.     Comments: Occasionally repeating herself    [facility] Cognitive Assessment  02/09/2021  Visuospatial/ Executive (0/5) 5   Naming (0/3) 3  Attention: Read list of digits (0/2) 2  Attention: Read list of letters (0/1) 1  Attention: Serial 7 subtraction starting at 100 (0/3) 3  Language: Repeat phrase (0/2) 2  Language : Fluency (0/1) 0  Abstraction (0/2) 2  Delayed Recall (0/5) 0  Orientation (0/6) 5  Total 23    Depression screen [facility] 2/9 02/09/2021 05/31/2020 05/21/2020  Decreased Interest 0 0 0  Down, Depressed, Hopeless 0 0 0  PHQ - 2 Score 0 0 0  Altered sleeping - 0 0  Tired, decreased energy - 0 0  Change in appetite - 0 0  Feeling bad or failure about yourself  - 0 0  Trouble concentrating - 0 0  Moving slowly or fidgety/restless - 0 0  Suicidal thoughts - 0 0  PHQ-9 Score - 0 0  Difficult doing work/chores - Not difficult at all -         Assessment & Plan:   Problem List Items Addressed This Visit      Endocrine  Diabetes mellitus type II, non insulin dependent ([place]) - Primary    Lab Results  Component Value Date   HGBA1C 6.2 (A) 05/24/2020   Well controlled on metformin 500 mg BID. Cont medication and diabetic diet      Hyperlipidemia associated with type 2 diabetes mellitus (HCC)    Cont pravastatin 20 mg. LDL 71 close to goal.   Lab Results  Component Value Date   CHOL 147 02/04/2021   HDL 55.30 02/04/2021   LDLCALC 71 02/04/2021   TRIG 107.0 02/04/2021   CHOLHDL 3 02/04/2021           Nervous and Auditory   Mild cognitive impairment with memory loss    Vit b12, thyroid, and labs normal within the last year. No depressive symptoms. [place] 23/30 with 0/5 word recall and -1 pt for date and was only able to list 4 words that begin with the letter "F" before giving up completely. Some memory loss but also lack of effort noted. She previously saw Dr. Manuella Ghazi with [facility] for neuropathy pain last year advised reaching out to his office for further evaluation. Advised against taking Prevagen       Relevant Orders   Ambulatory referral to Neurology     Other    History of breast cancer    Reviewed Dr. Virgie Dad note. She is on year 6/10 for tamoxifen as she elected to continue for 10 full years. Appreciate oncology support. Cont tamoxifen 20 mg.           Return in about 6 months (around 08/11/2021) for medicare wellness.  Lesleigh Noe, MD  This visit occurred during the SARS-CoV-2 public health emergency.  Safety protocols were in place, including screening questions prior to the visit, additional usage of staff PPE, and extensive cleaning of exam room while observing appropriate contact time as indicated for disinfecting solutions.

## 2021-02-10 ENCOUNTER — Telehealth: Payer: Self-pay

## 2021-02-10 NOTE — Telephone Encounter (Signed)
-----   Message from Philemon Kingdom, MD sent at 02/09/2021  8:43 AM EDT ----- Can you please call pt.:  Her labs are normal with the exception of a slightly low alkaline phosphatase.  Please continue her multivitamin which should help.

## 2021-02-10 NOTE — Telephone Encounter (Signed)
Pt verbalized understanding.

## 2021-02-14 DIAGNOSIS — H10501 Unspecified blepharoconjunctivitis, right eye: Secondary | ICD-10-CM | POA: Diagnosis not present

## 2021-02-28 DIAGNOSIS — H0012 Chalazion right lower eyelid: Secondary | ICD-10-CM | POA: Diagnosis not present

## 2021-03-10 DIAGNOSIS — H0012 Chalazion right lower eyelid: Secondary | ICD-10-CM | POA: Diagnosis not present

## 2021-03-18 DIAGNOSIS — E559 Vitamin D deficiency, unspecified: Secondary | ICD-10-CM | POA: Diagnosis not present

## 2021-03-18 DIAGNOSIS — Z79899 Other long term (current) drug therapy: Secondary | ICD-10-CM | POA: Diagnosis not present

## 2021-03-18 DIAGNOSIS — E538 Deficiency of other specified B group vitamins: Secondary | ICD-10-CM | POA: Diagnosis not present

## 2021-03-18 DIAGNOSIS — G3184 Mild cognitive impairment, so stated: Secondary | ICD-10-CM | POA: Diagnosis not present

## 2021-03-21 ENCOUNTER — Other Ambulatory Visit: Payer: Self-pay | Admitting: Internal Medicine

## 2021-03-21 ENCOUNTER — Other Ambulatory Visit: Payer: Self-pay | Admitting: Neurology

## 2021-03-21 DIAGNOSIS — Z1231 Encounter for screening mammogram for malignant neoplasm of breast: Secondary | ICD-10-CM

## 2021-03-21 DIAGNOSIS — G3184 Mild cognitive impairment, so stated: Secondary | ICD-10-CM

## 2021-03-24 DIAGNOSIS — H0012 Chalazion right lower eyelid: Secondary | ICD-10-CM | POA: Diagnosis not present

## 2021-03-29 ENCOUNTER — Other Ambulatory Visit: Payer: Self-pay | Admitting: Internal Medicine

## 2021-03-30 ENCOUNTER — Ambulatory Visit: Payer: Medicare Other

## 2021-05-16 ENCOUNTER — Other Ambulatory Visit: Payer: Self-pay | Admitting: *Deleted

## 2021-05-16 DIAGNOSIS — C50412 Malignant neoplasm of upper-outer quadrant of left female breast: Secondary | ICD-10-CM

## 2021-05-16 NOTE — Progress Notes (Signed)
Gabriela [name]  [phone] Fax:([phone]) [phone]    ID: Gabriela [name] DOB: 1946/08/05  MR#: [id number]  BJS#:[id number]  [name] Care Team: Gabriela Noe, MD as PCP - General (Family Medicine) Gabriela Kingdom, MD as Consulting Physician (Internal Medicine) Gabriela Bellows, MD as Consulting Physician (Gastroenterology) Gabriela [name], Gabriela Dad, MD as Consulting Physician (Oncology) OTHER MD: Gabriela [name], [place]; Gabriela Mount MD, Gabriela Arbour MD   CHIEF COMPLAINT: Estrogen receptor positive breast cancer  CURRENT TREATMENT: Tamoxifen   INTERVAL HISTORY: Gabriela [name] returns today for follow-up of her estrogen receptor positive breast cancer.   She continues on tamoxifen, with good tolerance. She denies having hot flashes or increased vaginal discharge.  She obtains Gabriela drug at no cost  Since her last visit, she underwent bilateral diagnostic mammography with tomography at [name] [place] on 04/28/2020 showing: breast density category B; probably-benign left breast calcifications in subareolar lumpectomy bed, likely reflecting early fat necrosis; no evidence of malignancy on right.  She returned for short-term follow up left diagnostic mammography on 11/01/2020 showing: no evidence of new or recurrent malignancy; benign rim-type fat necrosis calcifications in retroareolar left breast.  She is scheduled for annual mammography later today.   REVIEW OF SYSTEMS: Gabriela [name] continues to be quite busy with her home, and she does all Gabriela paying bills and accounts as well as all Gabriela housework.  She is also very busy mowing and gardening.  That is Gabriela main source of exercise.  A detailed review of systems today was otherwise stable.   COVID 19 VACCINATION STATUS: Gabriela [name] x2, status post 2 boosters as of July 2022   BREAST CANCER HISTORY: From Gabriela original intake note:  Gabriela [name] herself noted a lump in her left breast, associated with some discomfort. She  brought it to her physician's attention and on 01/15/2015 underwent left digital mammography with tomography and ultrasonography at [facility]. This showed breast density to be category A. Compression images were negative but on physical exam there was a firm palpable mobile nodule at Gabriela 3:00 position of Gabriela left breast. By ultrasound this measured 9 mm maximally. There is no note regarding axillary adenopathy in Gabriela mammographic report.  On 01/18/2015 Gabriela [name] underwent biopsy of Gabriela mass in question and this showed ([id number]; [id number]) an invasive mammary carcinoma, measuring 3 mm on Gabriela specimen, grade 1, estrogen receptor greater than 90% positive, progesterone receptor between 11 and 50% positive, with no HER-2 amplification  (Immunohistochemistry score 1+).  On 02/03/2015 Gabriela [name] underwent bilateral breast MRI. This suggested a breast density category B. Gabriela right breast was negative and there were no abnormal appearing lymph nodes. In Gabriela left breast there was an irregular enhancing mass subareolar early measuring 9 mm.  Her subsequent history is as detailed below   PAST MEDICAL HISTORY: Past Medical History:  Diagnosis Date   Arthritis    Cancer ([place])    left breast   Chronic pain    from fall 2012   Diabetes mellitus without complication ([place])    diet controlled   History of closed head injury 2012   accident work threw her 21f   Hyperlipemia    Personal history of radiation therapy    Spinal stenosis    Urinary incontinence    wears a pessery    PAST SURGICAL HISTORY: Past Surgical History:  Procedure Laterality Date   ABDOMINAL HYSTERECTOMY     BREAST LUMPECTOMY  1999   left   BREAST LUMPECTOMY Left 2016  BREAST LUMPECTOMY WITH RADIOACTIVE SEED LOCALIZATION Left 03/08/2018   Procedure: LEFT BREAST LUMPECTOMY WITH RADIOACTIVE SEED LOCALIZATION;  Surgeon: Gabriela Seltzer, MD;  Location: [place];  Service: General;  Laterality:  Left;   CATARACT EXTRACTION W/PHACO Left 10/08/2019   Procedure: CATARACT EXTRACTION PHACO AND INTRAOCULAR LENS PLACEMENT (IOC) LEFT 3.47, 00:46.0,  18.8%;  Surgeon: Gabriela Koyanagi, MD;  Location: [place];  Service: Ophthalmology;  Laterality: Left;  Diabetes - oral meds   COLONOSCOPY     COLONOSCOPY WITH PROPOFOL N/A 05/14/2018   Procedure: COLONOSCOPY WITH PROPOFOL;  Surgeon: Gabriela Bellows, MD;  Location: [facility];  Service: Gastroenterology;  Laterality: N/A;   ORIF WRIST FRACTURE Right 06/15/2017   Procedure: OPEN REDUCTION INTERNAL FIXATION (ORIF) WRIST FRACTURE WITH REDUCTION AND PINNINGT OF CARPAL DISLOCATION;  Surgeon: Milly Jakob, MD;  Location: [place];  Service: Orthopedics;  Laterality: Right;   RADIOACTIVE SEED GUIDED PARTIAL MASTECTOMY WITH AXILLARY SENTINEL LYMPH NODE BIOPSY Left 02/08/2015   Procedure: RADIOACTIVE SEED LOCALIZATION LUMPECTOMY WITH LEFT AXILLARY SENTINEL LYMPH NODE BIOPSY;  Surgeon: Gabriela Seltzer, MD;  Location: [place];  Service: General;  Laterality: Left;   SHOULDER ARTHROSCOPY  2012   left   TONSILLECTOMY      FAMILY HISTORY Family History  Problem Relation Age of Onset   Colon cancer Mother        possibly caused by medication during pregnancy   Breast cancer Maternal Aunt        dx in 31s   Cervical cancer Maternal Aunt        dx in 3s  Gabriela [name]'s father died in an automobile accident at age 27. Gabriela [name]'s mother died at Gabriela age of 12 from colon cancer. Gabriela [name] has been told that her mother was on a medication during her pregnancy that may have caused Gabriela colon cancer. Gabriela [name]'s mother had a sister diagnosed with breast and cervical cancer in her 66s. She also had a brother who was killed in World War II. There is no other history of cancer in Gabriela family to Gabriela [name]'s knowledge    GYNECOLOGIC HISTORY:  No LMP recorded. [name] has had a hysterectomy. Menarche age 67, first live birth age 84.  Gabriela [name] is GX P3. She went through Gabriela change of life approximately age 41. She did not take hormone replacement. She never used birth control    SOCIAL HISTORY:  Gabriela [name] and her husband Dominica Severin are both retired. They live on 90 acres and are quite busy caring for that. At is also Gabriela [name] chief source of exercise. [name] is an occupational Programmer, applications in [place]. [name] is a Cabin crew in [place]. [name] works as a Librarian, academic for Gabriela twin [employer] home in [place]. Gabriela [name] has 4 grandchildren. She is a Psychologist, forensic.    ADVANCED DIRECTIVES: In place   HEALTH MAINTENANCE: Social History   Tobacco Use   Smoking status: Never   Smokeless tobacco: Never  Vaping Use   Vaping Use: Never used  Substance Use Topics   Alcohol use: No   Drug use: No     Colonoscopy: 05/14/2018, negative for dysplasia  PAP: 2015  Bone density: 05/29/2018, 1.5  Lipid panel:  Allergies  Allergen Reactions   Oxycontin [Oxycodone Hcl] Shortness Of Breath   Sulfa Antibiotics Itching   Current Outpatient Medications  Medication Sig Dispense Refill   Biotin 1000 MCG tablet Take 1,000 mcg by mouth daily.  Blood Glucose Monitoring Suppl (ONE TOUCH ULTRA 2) w/Device KIT USE AS DIRECTED     cetirizine (ZYRTEC) 10 MG tablet Take 10 mg by mouth.     Cholecalciferol (VITAMIN D) 2000 units tablet Take by mouth.     diphenhydrAMINE (BENADRYL) 25 MG tablet Take 25 mg by mouth daily at 12 noon.     Garlic 4709 MG CAPS Take by mouth.     glucosamine-chondroitin 500-400 MG tablet Take 1 tablet by mouth daily.     Lancets (ONETOUCH DELICA PLUS [id number]) MISC USE TO CHECK BLOOD SUGAR  DAILY 100 each 3   metFORMIN (GLUCOPHAGE-XR) 500 MG 24 hr tablet TAKE 1 TABLET BY MOUTH  TWICE DAILY 180 tablet 3   Multiple Vitamins-Minerals (MULTIVITAMIN WOMEN 50+ PO) Take 1 tablet by mouth daily.     Omega-3 1000 MG CAPS Take by mouth.     ONETOUCH ULTRA test strip USE ONCE DAILY.  100 strip 3   pravastatin (PRAVACHOL) 20 MG tablet TAKE 1 TABLET BY MOUTH  DAILY 90 tablet 3   tamoxifen (NOLVADEX) 20 MG tablet Take 1 tablet (20 mg total) by mouth daily. 90 tablet 4   vitamin B-12 (CYANOCOBALAMIN) 1000 MCG tablet Take 1,000 mcg by mouth daily. dissovable     No current facility-administered medications for this visit.    OBJECTIVE: white woman who appears well  Vitals:   05/17/21 1033  BP: 140/60  Pulse: 68  Resp: 17  Temp: 98.1 F (36.7 C)  TempSrc: Temporal  SpO2: 98%  Weight: 174 lb 14.4 oz (79.3 kg)    Sclerae unicteric, EOMs intact Wearing a mask No cervical or supraclavicular adenopathy Lungs no rales or rhonchi Heart regular rate and rhythm Abd soft, nontender, positive bowel sounds MSK no focal spinal tenderness, no upper extremity lymphedema Neuro: nonfocal, well oriented, appropriate affect Breasts: Gabriela right breast is benign per Gabriela left breast is status postlumpectomy and radiation.  There is no evidence of local recurrence.  Both axillae are benign   LAB RESULTS:  CMP     Component Value Date/Time   NA 139 05/17/2021 1016   NA 138 02/20/2017 1149   K 4.4 05/17/2021 1016   K 4.7 02/20/2017 1149   CL 102 05/17/2021 1016   CO2 30 05/17/2021 1016   CO2 27 02/20/2017 1149   GLUCOSE 134 (H) 05/17/2021 1016   GLUCOSE 192 (H) 02/20/2017 1149   BUN 14 05/17/2021 1016   BUN 14.7 02/20/2017 1149   CREATININE 0.85 05/17/2021 1016   CREATININE 0.85 02/04/2021 1450   CREATININE 0.9 02/20/2017 1149   CALCIUM 9.5 05/17/2021 1016   CALCIUM 9.5 02/20/2017 1149   PROT 6.6 05/17/2021 1016   PROT 6.6 02/20/2017 1149   ALBUMIN 3.9 05/17/2021 1016   ALBUMIN 3.9 02/20/2017 1149   AST 22 05/17/2021 1016   AST 16 02/20/2017 1149   ALT 14 05/17/2021 1016   ALT 12 02/20/2017 1149   ALKPHOS 20 (L) 05/17/2021 1016   ALKPHOS 19 (L) 02/20/2017 1149   BILITOT 0.3 05/17/2021 1016   BILITOT 0.60 02/20/2017 1149   GFRNONAA >60 05/17/2021 1016   GFRNONAA  68 02/04/2021 1450   GFRAA 79 02/04/2021 1450    No results found for: TOTALPROTELP, ALBUMINELP, A1GS, A2GS, BETS, BETA2SER, GAMS, MSPIKE, SPEI  Lab Results  Component Value Date   WBC 5.1 05/17/2021   NEUTROABS 3.6 05/17/2021   HGB 12.3 05/17/2021   HCT 36.7 05/17/2021   MCV 91.8 05/17/2021   PLT 184 05/17/2021  No results found for: LABCA2  No components found for: [id number]  No results for input(s): INR in Gabriela last 168 hours.  No results found for: LABCA2  No results found for: JGG836  No results found for: OQH476  No results found for: LYY503  No results found for: CA2729  No components found for: HGQUANT  No results found for: CEA1 / No results found for: CEA1   No results found for: AFPTUMOR  No results found for: CHROMOGRNA  No results found for: KPAFRELGTCHN, LAMBDASER, KAPLAMBRATIO (kappa/lambda light chains)  No results found for: HGBA, HGBA2QUANT, HGBFQUANT, HGBSQUAN (Hemoglobinopathy evaluation)   No results found for: LDH  No results found for: IRON, TIBC, IRONPCTSAT (Iron and TIBC)  No results found for: FERRITIN  Urinalysis No results found for: COLORURINE, APPEARANCEUR, LABSPEC, PHURINE, GLUCOSEU, HGBUR, BILIRUBINUR, KETONESUR, PROTEINUR, UROBILINOGEN, NITRITE, LEUKOCYTESUR   STUDIES: No results found.   ASSESSMENT: 75 y.o. [place] woman status post left breast upper outer quadrant biopsy 01/18/2015 for a clinical T1b N0, stage IA invasive ductal carcinoma, grade 1, estrogen and progesterone receptor positive, HER-2 not amplified  (1) left lumpectomy and sentinel lymph node sampling 02/08/2015 showed a pT1b pN0, stage IA invasive ductal carcinoma, grade 1, with repeat HER-2 again negative. Margins were negative but close  (2) Oncotype DX showed a score of 14, predicting a risk of recurrence outside Gabriela breast in Gabriela next 10 years of 9% if Gabriela [name]'s only systemic treatment is tamoxifen for 5 years. It also protects no benefit  from adjuvant chemotherapy.  (3) adjuvant radiation completed in [facility] 05/11/2015  (4) tamoxifen started 05/20/2015  (a) bone density 05/29/2018 normal with a T score of 1.5  (5) TAH-BSO in December 2016. Benign pathology.  (6) left breast upper outer quadrant biopsy 03/08/2018 with no evidence of malignancy   PLAN: Ahilyn is now a little over 6 years out from definitive surgery for her breast cancer with no evidence of disease recurrence.  This is very favorable.  We discussed Gabriela fact that she can discontinue tamoxifen at this point and in fact "graduate" from cancer follow-up.  However she has no problems with Gabriela medication, obtains it pretty much free of charge and at Gabriela very least she is getting some benefit in terms of bone density as well as a slight risk reduction in breast cancer recurrence.  Accordingly Gabriela plan is to continue tamoxifen for 10years.  She has an appointment to see [place] again in 1 year.  She knows to call for any other issues that may develop before then  Total encounter time 20 minutes.*  Gunnard Dorrance, Gabriela Dad, MD  05/17/21 10:50 AM Medical Oncology and Hematology [facility] [place], [place] [address] Tel. [phone]    Fax. [phone]   I, Wilburn Mylar, am acting as scribe for Dr. Virgie [name]. [facility]ela [name].  I, Lurline Del MD, have reviewed Gabriela above documentation for accuracy and completeness, and I agree with Gabriela above.   *Total Encounter Time as defined by Gab[facility]ela [employer] for Medicare and Medicaid Services includes, in addition to Gabriela face-to-face time of a [name] visit (documented in Gabriela note above) non-face-to-face time: obtaining and reviewing outside history, ordering and reviewing medications, tests or procedures, care coordination (communications with other health care professionals or caregivers) and documentation in Gabriela medical record.

## 2021-05-17 ENCOUNTER — Inpatient Hospital Stay: Payer: Medicare Other | Attending: Oncology | Admitting: Oncology

## 2021-05-17 ENCOUNTER — Ambulatory Visit: Payer: Medicare Other

## 2021-05-17 ENCOUNTER — Other Ambulatory Visit: Payer: Self-pay

## 2021-05-17 ENCOUNTER — Inpatient Hospital Stay: Payer: Medicare Other

## 2021-05-17 DIAGNOSIS — Z170 Estrogen receptor positive status [ER+]: Secondary | ICD-10-CM

## 2021-05-17 DIAGNOSIS — Z923 Personal history of irradiation: Secondary | ICD-10-CM | POA: Diagnosis not present

## 2021-05-17 DIAGNOSIS — C50412 Malignant neoplasm of upper-outer quadrant of left female breast: Secondary | ICD-10-CM | POA: Diagnosis not present

## 2021-05-17 HISTORY — DX: Estrogen receptor positive status (ER+): Z17.0

## 2021-05-17 LAB — CBC WITH DIFFERENTIAL (CANCER CENTER ONLY)
Abs Immature Granulocytes: 0.01 10*3/uL (ref 0.00–0.07)
Basophils Absolute: 0 10*3/uL (ref 0.0–0.1)
Basophils Relative: 1 %
Eosinophils Absolute: 0 10*3/uL (ref 0.0–0.5)
Eosinophils Relative: 1 %
HCT: 36.7 % (ref 36.0–46.0)
Hemoglobin: 12.3 g/dL (ref 12.0–15.0)
Immature Granulocytes: 0 %
Lymphocytes Relative: 19 %
Lymphs Abs: 1 10*3/uL (ref 0.7–4.0)
MCH: 30.8 pg (ref 26.0–34.0)
MCHC: 33.5 g/dL (ref 30.0–36.0)
MCV: 91.8 fL (ref 80.0–100.0)
Monocytes Absolute: 0.5 10*3/uL (ref 0.1–1.0)
Monocytes Relative: 9 %
Neutro Abs: 3.6 10*3/uL (ref 1.7–7.7)
Neutrophils Relative %: 70 %
Platelet Count: 184 10*3/uL (ref 150–400)
RBC: 4 MIL/uL (ref 3.87–5.11)
RDW: 12.4 % (ref 11.5–15.5)
WBC Count: 5.1 10*3/uL (ref 4.0–10.5)
nRBC: 0 % (ref 0.0–0.2)

## 2021-05-17 LAB — CMP (CANCER CENTER ONLY)
ALT: 14 U/L (ref 0–44)
AST: 22 U/L (ref 15–41)
Albumin: 3.9 g/dL (ref 3.5–5.0)
Alkaline Phosphatase: 20 U/L — ABNORMAL LOW (ref 38–126)
Anion gap: 7 (ref 5–15)
BUN: 14 mg/dL (ref 8–23)
CO2: 30 mmol/L (ref 22–32)
Calcium: 9.5 mg/dL (ref 8.9–10.3)
Chloride: 102 mmol/L (ref 98–111)
Creatinine: 0.85 mg/dL (ref 0.44–1.00)
GFR, Estimated: >60 mL/min (ref >60)
Glucose, Bld: 134 mg/dL — ABNORMAL HIGH (ref 70–99)
Potassium: 4.4 mmol/L (ref 3.5–5.1)
Sodium: 139 mmol/L (ref 135–145)
Total Bilirubin: 0.3 mg/dL (ref 0.3–1.2)
Total Protein: 6.6 g/dL (ref 6.5–8.1)

## 2021-05-17 MED ORDER — TAMOXIFEN CITRATE 20 MG PO TABS: 20.0000 mg | ORAL_TABLET | Freq: Every day | ORAL | 4 refills | Status: DC

## 2021-05-31 ENCOUNTER — Other Ambulatory Visit: Payer: Self-pay | Admitting: Internal Medicine

## 2021-06-01 ENCOUNTER — Ambulatory Visit: Payer: Medicare Other

## 2021-06-14 DIAGNOSIS — E119 Type 2 diabetes mellitus without complications: Secondary | ICD-10-CM | POA: Diagnosis not present

## 2021-06-14 LAB — HM DIABETES EYE EXAM

## 2021-06-22 ENCOUNTER — Encounter: Payer: Self-pay | Admitting: Internal Medicine

## 2021-06-26 ENCOUNTER — Other Ambulatory Visit: Payer: Self-pay | Admitting: Internal Medicine

## 2021-08-09 ENCOUNTER — Ambulatory Visit (INDEPENDENT_AMBULATORY_CARE_PROVIDER_SITE_OTHER): Payer: Medicare Other | Admitting: Internal Medicine

## 2021-08-09 ENCOUNTER — Other Ambulatory Visit (INDEPENDENT_AMBULATORY_CARE_PROVIDER_SITE_OTHER): Payer: Medicare Other | Admitting: Internal Medicine

## 2021-08-09 ENCOUNTER — Other Ambulatory Visit: Payer: Self-pay

## 2021-08-09 ENCOUNTER — Encounter: Payer: Self-pay | Admitting: Internal Medicine

## 2021-08-09 ENCOUNTER — Other Ambulatory Visit: Payer: Self-pay | Admitting: Internal Medicine

## 2021-08-09 VITALS — BP 126/70 | HR 74 | Ht 67.0 in | Wt 166.2 lb

## 2021-08-09 DIAGNOSIS — E119 Type 2 diabetes mellitus without complications: Secondary | ICD-10-CM

## 2021-08-09 DIAGNOSIS — E785 Hyperlipidemia, unspecified: Secondary | ICD-10-CM | POA: Diagnosis not present

## 2021-08-09 DIAGNOSIS — R748 Abnormal levels of other serum enzymes: Secondary | ICD-10-CM | POA: Insufficient documentation

## 2021-08-09 DIAGNOSIS — G629 Polyneuropathy, unspecified: Secondary | ICD-10-CM | POA: Diagnosis not present

## 2021-08-09 LAB — POCT GLYCOSYLATED HEMOGLOBIN (HGB A1C)
Hemoglobin A1C: 6.2 % — AB (ref 4.0–5.6)
Hemoglobin A1C: 6.3 % — AB (ref 4.0–5.6)

## 2021-08-09 MED ORDER — METFORMIN HCL ER 500 MG PO TB24: 500.0000 mg | ORAL_TABLET | Freq: Two times a day (BID) | ORAL | 3 refills | Status: DC

## 2021-08-09 NOTE — Patient Instructions (Addendum)
Please continue Metformin ER 500 mg 2x a day.  Try to start a Multivitamin for women >50.  Please return in 6 months with your sugar log.

## 2021-08-09 NOTE — Progress Notes (Signed)
Patient ID: Gabriela [name], female   DOB: [birth year], 75 y.o.   MRN: [id number]   This visit occurred during the SARS-CoV-2 public health emergency.  Safety protocols were in place, including screening questions prior to the visit, additional usage of staff PPE, and extensive cleaning of exam room while observing appropriate contact time as indicated for disinfecting solutions.   HPI: Gabriela [name] is a 75 y.o.-year-old female, returning for f/u for DM2, dx in 04/2014, non-insulin-dependent, controlled, without long-term complications. Last visit 6 months ago.  Interim history: She continues to stay very active especially now that the left knee pain improved. No increased urination, blurry vision, nausea, chest pain. She feels that she lost weight, due to improvement in her diet.  Reviewed HbA1c levels: Lab Results  Component Value Date   HGBA1C 6.2 (A) 02/04/2021   HGBA1C 6.2 (A) 05/24/2020   HGBA1C 6.1 (A) 11/24/2019   HGBA1C 5.9 (A) 05/22/2019   HGBA1C 5.9 (A) 11/19/2018   HGBA1C 6.4 04/08/2018   HGBA1C 6 11/16/2017   HGBA1C 5.9 05/16/2017   HGBA1C 5.8 12/27/2016  08/08/2016: HbA1c 7.0% 02/04/2016: HbA1c 6.7% 05/12/2015: HbA1c 6.3% 11/17/2014: HbA1c 6.1% 08/07/2014: HbA1c 6.4%  Pt is on: - Metformin ER 500 mg with dinner (had gas when she was taking it in the morning) >> since last visit she was able to increase it to 500 mg 2x a day - no GI sxs She could not tolerate Metformin IR: HA, reflux, borborygmi  Pt checks her sugars once a day per review of her excellent log: - am: 130s-160, 170 >> n/c  - 2h after b'fast: 93-110 >> 108-118, 132, 148 (steroid inj) >> 95-120 >> 89-114 - before lunch: n/c  - 2h after lunch:  94-118 >> 109-125, 151 (steroid inj) >> 89-122 >> 102-118 - before dinner: n/c - 2h after dinner: 93-115 >> 86-121 >> 107-128 >> 96-120 >> 106-126 - bedtime: n/c - nighttime: n/c Lowest sugar was  86 >> 107 >> 89 >> 89; it is unclear at which level she has  hypoglycemia awareness. Highest sugar was 121 >> 151 >> 122 >> 126.  Glucometer: One Touch Ultra  Pt's meals are: - Breakfast: multi-grain sandwich with egg salad or pita or pimento cheese with crackers - Lunch: may skip; multi-grain crackers with salad or homemade pimento cheese - Dinner: grilled or broiled meat (occas. Shrimp)and vegetables - Snacks: apple/pear; almonds/cashews  No CKD, last BUN/creatinine:  Lab Results  Component Value Date   BUN 14 05/17/2021   BUN 18 02/04/2021   CREATININE 0.85 05/17/2021   CREATININE 0.85 02/04/2021   + HL;  last set of lipids: Lab Results  Component Value Date   CHOL 147 02/04/2021   HDL 55.30 02/04/2021   LDLCALC 71 02/04/2021   TRIG 107.0 02/04/2021   CHOLHDL 3 02/04/2021  02/04/2016: 181/84/43/121 We started pravastatin 20 mg daily before the above results returned.  She takes omega-3 fatty acids 1200 mg twice a day and also garlic.  - last eye exam was 06/03/2020: No DR, + cataract.    -She has improved numbness and tingling in her feet.  Previously on alpha-lipoic acid and B complex.  She was on B12 injections, now on p.o. B12.  She has a history of subdural hematoma 6 years ago after an accident when she was working at [employer]. Since then, she has hand tremors. In 2016 she has been dx'ed with BrCA and also had a total hysterectomy and a pelvic repair surgery.  In  02/2021, described her left knee was turning inwards and was very painful and she was working with orthopedics to get a prosthesis.  She did not want to undergo surgery.  She discovered an arthritis supplement that helped a lot. She likes to work in her garden.  Reviewing her chart, she also has a history of low alkaline phosphatase: Lab Results  Component Value Date   ALKPHOS 20 (L) 05/17/2021   ALKPHOS 22 (L) 04/13/2020   ALKPHOS 20 (L) 04/14/2019   ALKPHOS 16 (L) 09/28/2018   ALKPHOS 23 (L) 04/15/2018   ALKPHOS 18 (L) 04/08/2018   ALKPHOS 19 (L) 02/20/2017    ALKPHOS 21 (L) 07/19/2016   ALKPHOS 20 (L) 02/16/2016   ALKPHOS 19 (L) 07/27/2015   ALKPHOS 27 (L) 02/01/2015   Husband dx'ed with colon cancer - had colectomy at the beginning of 2020, and he had 2 AMIs Spring 2019.  ROS: + See HPI, + left knee pain  I reviewed pt's medications, allergies, PMH, social hx, family hx, and changes were documented in the history of present illness. Otherwise, unchanged from my initial visit note.  Past Medical History:  Diagnosis Date   Arthritis    Cancer ([place])    left breast   Chronic pain    from fall 2012   Diabetes mellitus without complication ([place])    diet controlled   History of closed head injury 2012   accident work threw her 57f   Hyperlipemia    Personal history of radiation therapy    Spinal stenosis    Urinary incontinence    wears a pessery   Past Surgical History:  Procedure Laterality Date   ABDOMINAL HYSTERECTOMY     BREAST LUMPECTOMY  1999   left   BREAST LUMPECTOMY Left 2016   BREAST LUMPECTOMY WITH RADIOACTIVE SEED LOCALIZATION Left 03/08/2018   Procedure: LEFT BREAST LUMPECTOMY WITH RADIOACTIVE SEED LOCALIZATION;  Surgeon: HExcell Seltzer MD;  [place] [facility]  Service: General;  Laterality: Left;   CATARACT EXTRACTION W/PHACO Left 10/08/2019   Procedure: CATARACT EXTRACTION PHACO AND INTRAOCULAR LENS PLACEMENT (IOC) LEFT 3.47, 00:46.0,  18.8%;  Surgeon: BLeandrew Koyanagi MD;  [facility]  Service: Ophthalmology;  Laterality: Left;  Diabetes - oral meds   COLONOSCOPY     COLONOSCOPY WITH PROPOFOL N/A 05/14/2018   Procedure: COLONOSCOPY WITH PROPOFOL;  Surgeon: AJonathon Bellows MD;  [facility]  Service: Gastroenterology;  Laterality: N/A;   ORIF WRIST FRACTURE Right 06/15/2017   Procedure: OPEN REDUCTION INTERNAL FIXATION (ORIF) WRIST FRACTURE WITH REDUCTION AND PINNINGT OF CARPAL DISLOCATION;  Surgeon: TMilly Jakob MD;  [place] [facility]  Service: Orthopedics;   Laterality: Right;   RADIOACTIVE SEED GUIDED PARTIAL MASTECTOMY WITH AXILLARY SENTINEL LYMPH NODE BIOPSY Left 02/08/2015   Procedure: RADIOACTIVE SEED LOCALIZATION LUMPECTOMY WITH LEFT AXILLARY SENTINEL LYMPH NODE BIOPSY;  Surgeon: BExcell Seltzer MD;  [place] [facility]  Service: General;  Laterality: Left;   SHOULDER ARTHROSCOPY  2012   left   TONSILLECTOMY     Social History   Social History   Marital status: Married    Spouse name: N/A   Number of children: 3   Occupational History   retired   Social History Main Topics   Smoking status: Never Smoker   Smokeless tobacco: Never Used   Alcohol use No   Drug use: No   Current Outpatient Medications on File Prior to Visit  Medication Sig Dispense Refill   Biotin 1000 MCG  tablet Take 1,000 mcg by mouth daily.     Blood Glucose Monitoring Suppl (ONE TOUCH ULTRA 2) w/Device KIT USE AS DIRECTED     cetirizine (ZYRTEC) 10 MG tablet Take 10 mg by mouth.     Cholecalciferol (VITAMIN D) 2000 units tablet Take by mouth.     diphenhydrAMINE (BENADRYL) 25 MG tablet Take 25 mg by mouth daily at 12 noon.     Garlic 0929 MG CAPS Take by mouth.     glucosamine-chondroitin 500-400 MG tablet Take 1 tablet by mouth daily.     Lancets (ONETOUCH DELICA PLUS [id number]) MISC USE TO CHECK BLOOD SUGAR  DAILY 100 each 3   metFORMIN (GLUCOPHAGE-XR) 500 MG 24 hr tablet TAKE 1 TABLET BY MOUTH  TWICE DAILY 180 tablet 0   Multiple Vitamins-Minerals (MULTIVITAMIN WOMEN 50+ PO) Take 1 tablet by mouth daily.     Omega-3 1000 MG CAPS Take by mouth.     ONETOUCH ULTRA test strip USE ONCE DAILY. 100 strip 3   pravastatin (PRAVACHOL) 20 MG tablet TAKE 1 TABLET BY MOUTH  DAILY 90 tablet 3   tamoxifen (NOLVADEX) 20 MG tablet Take 1 tablet (20 mg total) by mouth daily. 90 tablet 4   vitamin B-12 (CYANOCOBALAMIN) 1000 MCG tablet Take 1,000 mcg by mouth daily. dissovable     No current facility-administered medications on file prior to visit.    Allergies  Allergen Reactions   Oxycontin [Oxycodone Hcl] Shortness Of Breath   Sulfa Antibiotics Itching   Family history: - DM in sister, MGM.  PE: BP 126/70 (BP Location: Right Arm, Patient Position: Sitting, Cuff Size: Normal)   Pulse 74   Ht '5\' 7"'  (1.702 m)   Wt 166 lb 3.2 oz (75.4 kg)   SpO2 97%   BMI 26.03 kg/m  Wt Readings from Last 3 Encounters:  08/09/21 166 lb 3.2 oz (75.4 kg)  05/17/21 174 lb 14.4 oz (79.3 kg)  02/09/21 176 lb 8 oz (80.1 kg)   Constitutional: overweight, in NAD Eyes: PERRLA, EOMI, no exophthalmos ENT: moist mucous membranes, no thyromegaly, no cervical lymphadenopathy Cardiovascular: RRR, No MRG Respiratory: CTA B Gastrointestinal: abdomen soft, NT, ND, BS+ Musculoskeletal: no deformities, strength intact in all 4, L knee turned inward Skin: moist, warm, no rashes Neurological: no tremor with outstretched hands, DTR normal in all 4  ASSESSMENT: 1. DM2, non-insulin-dependent, controlled, without long-term complications  2. HL  3. PN  4.  Low Aphos  PLAN:  1. Pt with well-controlled type 2 diabetes on low-dose metformin ER only.  At last visit, HbA1c was 6.2%, stable.  At that time, sugars were all at goal.  We did not change her regimen. -At today's visit, but her carefully kept a log, her sugars are all at goal.  She is taking after meals, and the highest blood sugar that she saw was 126, after dinner.  No changes needed in her medication regimen.  I refilled her metformin. - I suggested to:  Patient Instructions  Please continue Metformin ER 500 mg 2x a day.  Try to start a Multivitamin for women >50.  Please return in 6 months with your sugar log.  - we checked her HbA1c: 6.3% (slightly higher than before, but from her log, I would have expected a 6.0%) - advised to check sugars at different times of the day - 1x a day, rotating check times - advised for yearly eye exams >> she is not UTD - return to clinic in 6 months  2.  HL -Reviewed latest lipid panel from 02/2021: All fractions at goal: Lab Results  Component Value Date   CHOL 147 02/04/2021   HDL 55.30 02/04/2021   LDLCALC 71 02/04/2021   TRIG 107.0 02/04/2021   CHOLHDL 3 02/04/2021  -She is on omega-3 fatty acids and garlic.  I suggested pravastatin 20 mg daily and she did start this -no side effects.  3. PN -Previously on alpha-lipoic acid and B complex, now off -Previously on B12 injections daily -Numbness and tingling are resolved  4. Low alkaline phosphatase -She is not on antiresorptive medication -We discussed the most common possible causes for this can be:  Genetic hypophosphatasia, may be asymptomatic in adults, but I wonder if her left leg inward turning may be related to this.  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

## 2021-08-11 ENCOUNTER — Encounter: Payer: Medicare Other | Admitting: Family Medicine

## 2021-08-18 ENCOUNTER — Other Ambulatory Visit: Payer: Self-pay | Admitting: Internal Medicine

## 2021-12-16 DIAGNOSIS — M545 Low back pain, unspecified: Secondary | ICD-10-CM | POA: Diagnosis not present

## 2021-12-16 DIAGNOSIS — M47816 Spondylosis without myelopathy or radiculopathy, lumbar region: Secondary | ICD-10-CM | POA: Diagnosis not present

## 2021-12-16 DIAGNOSIS — M4316 Spondylolisthesis, lumbar region: Secondary | ICD-10-CM | POA: Diagnosis not present

## 2021-12-16 DIAGNOSIS — M8588 Other specified disorders of bone density and structure, other site: Secondary | ICD-10-CM | POA: Diagnosis not present

## 2021-12-18 DIAGNOSIS — Z03818 Encounter for observation for suspected exposure to other biological agents ruled out: Secondary | ICD-10-CM | POA: Diagnosis not present

## 2021-12-18 DIAGNOSIS — U071 COVID-19: Secondary | ICD-10-CM | POA: Diagnosis not present

## 2021-12-18 DIAGNOSIS — E119 Type 2 diabetes mellitus without complications: Secondary | ICD-10-CM | POA: Diagnosis not present

## 2021-12-23 DIAGNOSIS — R42 Dizziness and giddiness: Secondary | ICD-10-CM | POA: Diagnosis not present

## 2021-12-23 DIAGNOSIS — J019 Acute sinusitis, unspecified: Secondary | ICD-10-CM | POA: Diagnosis not present

## 2021-12-23 DIAGNOSIS — R5383 Other fatigue: Secondary | ICD-10-CM | POA: Diagnosis not present

## 2021-12-23 DIAGNOSIS — U071 COVID-19: Secondary | ICD-10-CM | POA: Diagnosis not present

## 2022-01-19 ENCOUNTER — Telehealth: Payer: Self-pay | Admitting: Internal Medicine

## 2022-01-19 NOTE — Telephone Encounter (Signed)
patient called to advise that she is not comfortable driving to [facility] and needed to cancel the appt on 02/07/2022.  Did not want to reschedule at this time ?

## 2022-02-07 ENCOUNTER — Ambulatory Visit: Payer: Medicare Other | Admitting: Internal Medicine

## 2022-03-02 ENCOUNTER — Other Ambulatory Visit: Payer: Self-pay | Admitting: Internal Medicine

## 2022-03-06 ENCOUNTER — Encounter: Payer: Self-pay | Admitting: Family

## 2022-03-06 ENCOUNTER — Ambulatory Visit (INDEPENDENT_AMBULATORY_CARE_PROVIDER_SITE_OTHER): Payer: Medicare Other | Admitting: Family

## 2022-03-06 VITALS — BP 142/76 | HR 63 | Temp 98.2°F | Ht 67.0 in | Wt 179.5 lb

## 2022-03-06 DIAGNOSIS — E871 Hypo-osmolality and hyponatremia: Secondary | ICD-10-CM | POA: Diagnosis not present

## 2022-03-06 DIAGNOSIS — E559 Vitamin D deficiency, unspecified: Secondary | ICD-10-CM | POA: Diagnosis not present

## 2022-03-06 DIAGNOSIS — E785 Hyperlipidemia, unspecified: Secondary | ICD-10-CM | POA: Diagnosis not present

## 2022-03-06 DIAGNOSIS — E119 Type 2 diabetes mellitus without complications: Secondary | ICD-10-CM

## 2022-03-06 DIAGNOSIS — E1169 Type 2 diabetes mellitus with other specified complication: Secondary | ICD-10-CM

## 2022-03-06 DIAGNOSIS — N951 Menopausal and female climacteric states: Secondary | ICD-10-CM | POA: Insufficient documentation

## 2022-03-06 DIAGNOSIS — G3184 Mild cognitive impairment, so stated: Secondary | ICD-10-CM

## 2022-03-06 DIAGNOSIS — D519 Vitamin B12 deficiency anemia, unspecified: Secondary | ICD-10-CM

## 2022-03-06 DIAGNOSIS — Z1231 Encounter for screening mammogram for malignant neoplasm of breast: Secondary | ICD-10-CM | POA: Insufficient documentation

## 2022-03-06 DIAGNOSIS — Z13220 Encounter for screening for lipoid disorders: Secondary | ICD-10-CM | POA: Diagnosis not present

## 2022-03-06 DIAGNOSIS — Z853 Personal history of malignant neoplasm of breast: Secondary | ICD-10-CM | POA: Diagnosis not present

## 2022-03-06 NOTE — Patient Instructions (Addendum)
Call [facility] to schedule your mammogram and bone density as I have sent the electronic order to their facility.  ?Here is their number : [phone]  ? ?Recommend shingles vaccination, you can get this at the pharmacy.  ? ?Recommend you see eye doctor for diabetic eye exam, due.  ? ?Make sure you are still taking daily tamoxifen (your oncology/breast cancer medication) ?Next appt 06/13/22 at 940 am.  ? ?Welcome to our clinic, I am happy to have you as my new patient. I am excited to continue on this healthcare journey with you. ? ?Stop by the lab prior to leaving today. I will notify you of your results once received.  ? ?Please keep in mind ?Any my chart messages you send have p to a three business day turnaround for a response.  ?Phone calls may have up to a one day business turnaround for a  response.  ? ?If you need a medication refill I recommend you request it through the pharmacy as this is easiest for [place] rather than sending a message and or phone call.  ? ?Due to recent changes in healthcare laws, you may see results of your imaging and/or laboratory studies on MyChart before I have had a chance to review them.  I understand that in some cases there may be results that are confusing or concerning to you. Please understand that not all results are received at the same time and often I may need to interpret multiple results in order to provide you with the best plan of care or course of treatment. Therefore, I ask that you please give me 2 business days to thoroughly review all your results before contacting my office for clarification. Should we see a critical lab result, you will be contacted sooner.  ? ?It was a pleasure seeing you today! Please do not hesitate to reach out with any questions and or concerns. ? ?Regards,  ? ?Ausar Georgiou ?FNP-C ? ?

## 2022-03-06 NOTE — Progress Notes (Signed)
? ?Established Patient Office Visit ? ?Subjective:  ?Patient ID: Gabriela [name], female    DOB: [birth year]  Age: 76 y.o. MRN: [id number] ? ?CC:  ?Chief Complaint  ?Patient presents with  ? Establish Care  ? ? ?HPI ?Gabriela [name] is here for a transition of care visit. ? ?Prior provider was: Tor Netters, FNP  ?Pt is without acute concerns.  ? ?Colonoscopy: 05/14/2018 , repeat in 5-10 years. Mom with colon cancer history. Suggest pt go back for 2024 next year for repeat. Did have polyp that was benign.  ?  ?Bone density: 2019- normal  ?chronic concerns: ? ?Memory loss: if someone asks her more than one thing, and forgets an answer she will forget some but otherwise with good short term and long term memory. She just has a lot of tasks throughout the day so sometimes results in memory lapses at times, but she states not concerning. She states no one has ever complained of her forgetting anything.  ? ?H/o breast cancer, left breast. Three breast lumpectomy. 2016 partial radioactive seed guided partial mastectomy . Still taking tamoxifen 20 mg tablet once daily. Oncology wants to follow up yearly for use of tamoxifen.  ? ?DM2: sometimes forgets metformin 500 mg twice daily will occasionally take once daily. Last eye exam for diabetes, over one year ago.  ? ?HLD: taking pravastatin for cholesterol 20 mg once daily.  ? ? ?  03/06/2022  ?  2:18 PM  ?GAD 7 : Generalized Anxiety Score  ?Nervous, Anxious, on Edge 0  ?Control/stop worrying 0  ?Worry too much - different things 0  ?Trouble relaxing 0  ?Restless 0  ?Easily annoyed or irritable 0  ?Afraid - awful might happen 0  ?Total GAD 7 Score 0  ?Anxiety Difficulty Not difficult at all  ? ? ? ?  03/06/2022  ?  2:18 PM 02/09/2021  ? 10:58 AM 05/31/2020  ?  8:20 AM  ?PHQ9 SCORE ONLY  ?PHQ-9 Total Score 0 0 0  ? ? ?Past Medical History:  ?Diagnosis Date  ? Arthritis   ? Cancer [facility])   ? left breast  ? Chronic pain   ? from fall 2012  ? Diabetes mellitus without complication ([place])    ? diet controlled  ? History of closed head injury 2012  ? accident work threw her 45f  ? Hyperlipemia   ? Malignant neoplasm of upper-outer quadrant of left breast in female, estrogen receptor positive ([place] 05/17/2021  ? Personal history of radiation therapy   ? Spinal stenosis   ? Urinary incontinence   ? wears a pessery  ? ? ?Past Surgical History:  ?Procedure Laterality Date  ? ABDOMINAL HYSTERECTOMY    ? BREAST LUMPECTOMY  1999  ? left  ? BREAST LUMPECTOMY Left 2016  ? BREAST LUMPECTOMY WITH RADIOACTIVE SEED LOCALIZATION Left 03/08/2018  ? Procedure: LEFT BREAST LUMPECTOMY WITH RADIOACTIVE SEED LOCALIZATION;  Surgeon: HExcell Seltzer MD;  [place]  Service: General;  Laterality: Left;  ? CATARACT EXTRACTION W/PHACO Left 10/08/2019  ? Procedure: CATARACT EXTRACTION PHACO AND INTRAOCULAR LENS PLACEMENT (IOC) LEFT 3.47, 00:46.0,  18.8%;  Surgeon: BLeandrew Koyanagi MD;  [place] [facility] [facility]  Service: Ophthalmology;  Laterality: Left;  Diabetes - oral meds  ? COLONOSCOPY    ? COLONOSCOPY WITH PROPOFOL N/A 05/14/2018  ? Procedure: COLONOSCOPY WITH PROPOFOL;  Surgeon: AJonathon Bellows MD;  [facility]  Service: Gastroenterology;  Laterality: N/A;  ? ORIF WRIST FRACTURE Right 06/15/2017  ?  Procedure: OPEN REDUCTION INTERNAL FIXATION (ORIF) WRIST FRACTURE WITH REDUCTION AND PINNINGT OF CARPAL DISLOCATION;  Surgeon: Milly Jakob, MD;  Location: [facility];  Service: Orthopedics;  Laterality: Right;  ? RADIOACTIVE SEED GUIDED PARTIAL MASTECTOMY WITH AXILLARY SENTINEL LYMPH NODE BIOPSY Left 02/08/2015  ? Procedure: RADIOACTIVE SEED LOCALIZATION LUMPECTOMY WITH LEFT AXILLARY SENTINEL LYMPH NODE BIOPSY;  Surgeon: Excell Seltzer, MD;  Location: [place];  Service: General;  Laterality: Left;  ? SHOULDER ARTHROSCOPY  2012  ? left  ? TONSILLECTOMY    ? ? ?Family History  ?Problem Relation Age of Onset  ? Colon cancer Mother   ?     possibly caused by medication  during pregnancy  ? Breast cancer Maternal Aunt   ?     dx in 62s  ? Cervical cancer Maternal Aunt   ?     dx in 53s  ? ? ?Social History  ? ?Socioeconomic History  ? Marital status: Married  ?  Spouse name: Dominica Severin  ? Number of children: 3  ? Years of education: some college  ? Highest education level: Not on file  ?Occupational History  ? Occupation: retired  ?Tobacco Use  ? Smoking status: Never  ? Smokeless tobacco: Never  ?Vaping Use  ? Vaping Use: Never used  ?Substance and Sexual Activity  ? Alcohol use: No  ? Drug use: No  ? Sexual activity: Not Currently  ?  Partners: Male  ?  Birth control/protection: Post-menopausal  ?Other Topics Concern  ? Not on file  ?Social History Narrative  ? The patient and her husband Dominica Severin are both retired. They live on [address] acres in our quite busy caring for that. At is also [place]'s chief source of exercise. She is a Psychologist, forensic.  ?   ? 02/09/21  ? From: the are  ? Living: with Dominica Severin, 1970  ? Work: retired - [employer]  ?   ? Family:Daughter Jamesetta So is an occupational Programmer, applications in [place]. Daughter Rolly Salter is a Cabin crew in [place]. Son Aleese Kamps works for the city of [place]. The patient has 4 grandchildren.  ?   ? Enjoys: spend time with husband, gardening, and canning veggies/figs/jelly  ?   ? Exercise: gardening and mowing the yard  ? Diet: healthy diabetic  ?   ? Safety  ? Seat belts: Yes   ? Guns: Yes  and secure  ? Safe in relationships: Yes   ? ?Social Determinants of Health  ? ?Financial Resource Strain: Not on file  ?Food Insecurity: Not on file  ?Transportation Needs: Not on file  ?Physical Activity: Not on file  ?Stress: Not on file  ?Social Connections: Not on file  ?Intimate Partner Violence: Not on file  ? ? ?Outpatient Medications Prior to Visit  ?Medication Sig Dispense Refill  ? Biotin 1000 MCG tablet Take 1,000 mcg by mouth daily.    ? Blood Glucose Monitoring Suppl (ONE TOUCH ULTRA 2) w/Device KIT USE AS DIRECTED    ? cetirizine (ZYRTEC) 10 MG tablet Take  10 mg by mouth.    ? Cholecalciferol (VITAMIN D) 2000 units tablet Take by mouth.    ? Garlic 8938 MG CAPS Take by mouth.    ? glucosamine-chondroitin 500-400 MG tablet Take 1 tablet by mouth daily.    ? Lancets (ONETOUCH DELICA PLUS [id number]) MISC USE TO CHECK BLOOD SUGAR  DAILY 100 each 3  ? metFORMIN (GLUCOPHAGE-XR) 500 MG 24 hr tablet Take 1 tablet (500 mg total) by mouth  2 (two) times daily. 180 tablet 3  ? Multiple Vitamins-Minerals (MULTIVITAMIN WOMEN 50+ PO) Take 1 tablet by mouth daily.    ? ONETOUCH ULTRA test strip USE ONCE DAILY. 100 strip 3  ? pravastatin (PRAVACHOL) 20 MG tablet TAKE 1 TABLET BY MOUTH  DAILY 90 tablet 3  ? tamoxifen (NOLVADEX) 20 MG tablet Take 1 tablet (20 mg total) by mouth daily. 90 tablet 4  ? diphenhydrAMINE (BENADRYL) 25 MG tablet Take 25 mg by mouth daily at 12 noon. (Patient not taking: Reported on 03/06/2022)    ? Omega-3 1000 MG CAPS Take by mouth. (Patient not taking: Reported on 03/06/2022)    ? vitamin B-12 (CYANOCOBALAMIN) 1000 MCG tablet Take 1,000 mcg by mouth daily. dissovable (Patient not taking: Reported on 03/06/2022)    ? ?No facility-administered medications prior to visit.  ? ? ?Allergies  ?Allergen Reactions  ? Oxycontin [Oxycodone Hcl] Shortness Of Breath  ? Sulfa Antibiotics Itching  ? ? ?ROS ?Review of Systems  ?Constitutional:  Negative for chills, fatigue, fever and unexpected weight change.  ?Eyes:  Negative for visual disturbance.  ?Respiratory:  Negative for shortness of breath.   ?Cardiovascular:  Negative for chest pain.  ?Gastrointestinal:  Negative for abdominal pain.  ?Genitourinary:  Negative for difficulty urinating.  ?Musculoskeletal:  Negative for myalgias.  ?Skin:  Negative for rash.  ?Neurological:  Negative for dizziness and headaches.  ?Psychiatric/Behavioral:  Positive for decreased concentration (at times as she has a lof of daily tasks planned and at times thinking about multiple things, forgets things here and there stating mainly when she is  juggling alot of tasks).   ? ? ? ?  ?Objective:  ?  ? ? Mini-Cog - 03/07/22 0729   ? ? How many words correct? 3   ? ?  ?  ? ?  ? ? ? ?Physical Exam ?Vitals reviewed.  ?Constitutional:   ?   [place]

## 2022-03-07 ENCOUNTER — Encounter: Payer: Self-pay | Admitting: Family

## 2022-03-07 LAB — BASIC METABOLIC PANEL
BUN: 12 mg/dL (ref 6–23)
CO2: 29 mEq/L (ref 19–32)
Calcium: 9.4 mg/dL (ref 8.4–10.5)
Chloride: 97 mEq/L (ref 96–112)
Creatinine, Ser: 0.85 mg/dL (ref 0.40–1.20)
GFR: 67.51 mL/min (ref >60.00)
Glucose, Bld: 109 mg/dL — ABNORMAL HIGH (ref 70–99)
Potassium: 4.1 mEq/L (ref 3.5–5.1)
Sodium: 135 mEq/L (ref 135–145)

## 2022-03-07 LAB — MICROALBUMIN / CREATININE URINE RATIO
Creatinine,U: 39.9 mg/dL
Microalb Creat Ratio: 1.8 mg/g (ref 0.0–30.0)
Microalb, Ur: <0.7 mg/dL (ref 0.0–1.9)

## 2022-03-07 LAB — LIPID PANEL
Cholesterol: 158 mg/dL (ref 0–200)
HDL: 64.6 mg/dL (ref >39.00)
LDL Cholesterol: 77 mg/dL (ref 0–99)
NonHDL: 93.77
Total CHOL/HDL Ratio: 2
Triglycerides: 86 mg/dL (ref 0.0–149.0)
VLDL: 17.2 mg/dL (ref 0.0–40.0)

## 2022-03-07 LAB — HEMOGLOBIN A1C: Hgb A1c MFr Bld: 6.9 % — ABNORMAL HIGH (ref 4.6–6.5)

## 2022-03-07 LAB — B12 AND FOLATE PANEL
Folate: >23.5 ng/mL (ref >5.9)
Vitamin B-12: >1504 pg/mL — ABNORMAL HIGH (ref 211–911)

## 2022-03-07 LAB — VITAMIN D 25 HYDROXY (VIT D DEFICIENCY, FRACTURES): VITD: 59.94 ng/mL (ref 30.00–100.00)

## 2022-03-07 NOTE — Assessment & Plan Note (Signed)
Repeat bmp pending results 

## 2022-03-07 NOTE — Assessment & Plan Note (Signed)
Mini cog preformed, pt able to recall 3 words  ?At f/u may consider MMSE ?

## 2022-03-07 NOTE — Assessment & Plan Note (Signed)
Ordered lipid panel, pending results. Work on low cholesterol diet and exercise as tolerated ?Continue pravastatin 20 mg ?

## 2022-03-07 NOTE — Assessment & Plan Note (Signed)
continue tamoxifen  ?Continue f/u with oncology as scheduled. ?Reviewed most recent note from oncology as well as past mammogram. ?

## 2022-03-07 NOTE — Assessment & Plan Note (Signed)
Ordered hga1c today pending results. Work on diabetic diet and exercise as tolerated. Yearly foot exam, and annual eye exam.   

## 2022-03-07 NOTE — Assessment & Plan Note (Signed)
Mammogram ordered. Pending results. 

## 2022-03-07 NOTE — Assessment & Plan Note (Signed)
Ordering b12 pending results 

## 2022-03-07 NOTE — Assessment & Plan Note (Addendum)
Bone density ordered pending results.  ?Reviewed old bone density report ?

## 2022-03-07 NOTE — Assessment & Plan Note (Signed)
Ordered vitamin d pending results.  ?Continue vitamin d3 2000 iu once daily ?

## 2022-03-08 NOTE — Progress Notes (Signed)
Your hemoglobin A1c which is your diabetic numbers has increased from 6.3-6.9.  Please remember to take your metformin twice a day not just once daily I think that this would help the numbers come down for sure.  Work on a diabetic diet and exercise as tolerated. ? ?Also noted your B12 is extremely high.  What dose are you currently taking for B12?  We do need to decrease this if not completely stop taking it. ? ?Urine microalbumin is negative ? ?Cholesterol is beautiful ? ?Vitamin D is normal range

## 2022-05-01 ENCOUNTER — Other Ambulatory Visit: Payer: Self-pay | Admitting: Internal Medicine

## 2022-05-03 ENCOUNTER — Other Ambulatory Visit: Payer: Self-pay | Admitting: *Deleted

## 2022-05-03 MED ORDER — TAMOXIFEN CITRATE 20 MG PO TABS: 20.0000 mg | ORAL_TABLET | Freq: Every day | ORAL | 4 refills | Status: DC

## 2022-05-04 ENCOUNTER — Ambulatory Visit
Admission: RE | Admit: 2022-05-04 | Discharge: 2022-05-04 | Disposition: A | Discharge status: Home / Self Care | Payer: Medicare Other | Source: Ambulatory Visit | Attending: Family | Admitting: Family

## 2022-05-04 DIAGNOSIS — N641 Fat necrosis of breast: Secondary | ICD-10-CM | POA: Insufficient documentation

## 2022-05-04 DIAGNOSIS — Z13820 Encounter for screening for osteoporosis: Secondary | ICD-10-CM | POA: Diagnosis not present

## 2022-05-04 DIAGNOSIS — Z9889 Other specified postprocedural states: Secondary | ICD-10-CM | POA: Insufficient documentation

## 2022-05-04 DIAGNOSIS — Z853 Personal history of malignant neoplasm of breast: Secondary | ICD-10-CM | POA: Diagnosis not present

## 2022-05-04 DIAGNOSIS — N951 Menopausal and female climacteric states: Secondary | ICD-10-CM | POA: Diagnosis present

## 2022-05-04 DIAGNOSIS — Z780 Asymptomatic menopausal state: Secondary | ICD-10-CM | POA: Insufficient documentation

## 2022-05-04 DIAGNOSIS — Z1231 Encounter for screening mammogram for malignant neoplasm of breast: Secondary | ICD-10-CM | POA: Insufficient documentation

## 2022-05-04 DIAGNOSIS — Z90710 Acquired absence of both cervix and uterus: Secondary | ICD-10-CM | POA: Diagnosis not present

## 2022-05-04 NOTE — Progress Notes (Signed)
Bone density normal range

## 2022-05-05 NOTE — Progress Notes (Signed)
Mammogram came back without acute findings.

## 2022-06-13 ENCOUNTER — Inpatient Hospital Stay: Payer: Medicare Other | Attending: Hematology and Oncology

## 2022-06-13 ENCOUNTER — Inpatient Hospital Stay: Payer: Medicare Other | Admitting: Hematology and Oncology

## 2022-06-13 NOTE — Progress Notes (Deleted)
[facility]  [phone] [phone]    ID: Gabriela [name] DOB: 23-Aug-1946  MR#: [id number]  JTT#:[id number]  Patient Care Team: Eugenia Pancoast, [place] as PCP - General (Family Medicine) Philemon Kingdom, MD as Consulting Physician (Internal Medicine) Jonathon Bellows, MD as Consulting Physician (Gastroenterology) Magrinat, Virgie Dad, MD (Inactive) as Consulting Physician (Oncology) OTHER MD: Prentiss Bells "[name]" [place], [place]; Berton Mount MD, Dossie Arbour MD   CHIEF COMPLAINT: Estrogen receptor positive breast cancer  CURRENT TREATMENT: Tamoxifen   INTERVAL HISTORY: Jaylea returns today for follow-up of her estrogen receptor positive breast cancer.   She continues on tamoxifen, with good tolerance. She denies having hot flashes or increased vaginal discharge.  She obtains the drug at no cost  Since her last visit, she underwent bilateral diagnostic mammography with tomography at [facility] on 04/28/2020 showing: breast density category B; probably-benign left breast calcifications in subareolar lumpectomy bed, likely reflecting early fat necrosis; no evidence of malignancy on right.  She returned for short-term follow up left diagnostic mammography on 11/01/2020 showing: no evidence of new or recurrent malignancy; benign rim-type fat necrosis calcifications in retroareolar left breast.  She is scheduled for annual mammography later today.   REVIEW OF SYSTEMS: Jolane continues to be quite busy with her home, and she does all the paying bills and accounts as well as all the housework.  She is also very busy mowing and gardening.  That is the main source of exercise.  A detailed review of systems today was otherwise stable.   COVID 19 VACCINATION STATUS: [place] x2, status post 2 boosters as of July 2022   BREAST CANCER HISTORY: From the original intake note:  The patient herself noted a lump in her left breast, associated with some  discomfort. She brought it to her physician's attention and on 01/15/2015 underwent left digital mammography with tomography and ultrasonography at [facility]. This showed breast density to be category A. Compression images were negative but on physical exam there was a firm palpable mobile nodule at the 3:00 position of the left breast. By ultrasound this measured 9 mm maximally. There is no note regarding axillary adenopathy in the mammographic report.  On 01/18/2015 the patient underwent biopsy of the mass in question and this showed ([id number]; [id number]) an invasive mammary carcinoma, measuring 3 mm on the specimen, grade 1, estrogen receptor greater than 90% positive, progesterone receptor between 11 and 50% positive, with no HER-2 amplification  (Immunohistochemistry score 1+).  On 02/03/2015 the patient underwent bilateral breast MRI. This suggested a breast density category B. The right breast was negative and there were no abnormal appearing lymph nodes. In the left breast there was an irregular enhancing mass subareolar early measuring 9 mm.  Her subsequent history is as detailed below   PAST MEDICAL HISTORY: Past Medical History:  Diagnosis Date   Arthritis    Cancer ([place])    left breast   Chronic pain    from fall 2012   Diabetes mellitus without complication ([place])    diet controlled   History of closed head injury 2012   accident work threw her 23f   Hyperlipemia    Malignant neoplasm of upper-outer quadrant of left breast in female, estrogen receptor positive ([place] 05/17/2021   Personal history of radiation therapy    Spinal stenosis    Urinary incontinence    wears a pessery    PAST SURGICAL HISTORY: Past Surgical History:  Procedure Laterality Date   ABDOMINAL HYSTERECTOMY  BREAST LUMPECTOMY  1999   left   BREAST LUMPECTOMY Left 2016   BREAST LUMPECTOMY WITH RADIOACTIVE SEED LOCALIZATION Left 03/08/2018   Procedure: LEFT BREAST LUMPECTOMY WITH  RADIOACTIVE SEED LOCALIZATION;  Surgeon: Excell Seltzer, MD;  Location: [place];  Service: General;  Laterality: Left;   CATARACT EXTRACTION W/PHACO Left 10/08/2019   Procedure: CATARACT EXTRACTION PHACO AND INTRAOCULAR LENS PLACEMENT (IOC) LEFT 3.47, 00:46.0,  18.8%;  Surgeon: Leandrew Koyanagi, MD;  Location: [place] [facility];  Service: Ophthalmology;  Laterality: Left;  Diabetes - oral meds   COLONOSCOPY     COLONOSCOPY WITH PROPOFOL N/A 05/14/2018   Procedure: COLONOSCOPY WITH PROPOFOL;  Surgeon: Jonathon Bellows, MD;  Location: [facility];  Service: Gastroenterology;  Laterality: N/A;   ORIF WRIST FRACTURE Right 06/15/2017   Procedure: OPEN REDUCTION INTERNAL FIXATION (ORIF) WRIST FRACTURE WITH REDUCTION AND PINNINGT OF CARPAL DISLOCATION;  Surgeon: Milly Jakob, MD;  Location: [place];  Service: Orthopedics;  Laterality: Right;   RADIOACTIVE SEED GUIDED PARTIAL MASTECTOMY WITH AXILLARY SENTINEL LYMPH NODE BIOPSY Left 02/08/2015   Procedure: RADIOACTIVE SEED LOCALIZATION LUMPECTOMY WITH LEFT AXILLARY SENTINEL LYMPH NODE BIOPSY;  Surgeon: Excell Seltzer, MD;  Location: [place];  Service: General;  Laterality: Left;   SHOULDER ARTHROSCOPY  2012   left   TONSILLECTOMY      FAMILY HISTORY Family History  Problem Relation Age of Onset   Colon cancer Mother        possibly caused by medication during pregnancy   Breast cancer Maternal Aunt        dx in 18s   Cervical cancer Maternal Aunt        dx in 70s  The patient's father died in an automobile accident at age 7. The patient's mother died at the age of 89 from colon cancer. The patient has been told that her mother was on a medication during her pregnancy that may have caused the colon cancer. The patient's mother had a sister diagnosed with breast and cervical cancer in her 35s. She also had a brother who was killed in World War II. There is no other history of cancer in the family to the  patient's knowledge    GYNECOLOGIC HISTORY:  No LMP recorded. Patient has had a hysterectomy. Menarche age 76, first live birth age 28. The patient is GX P3. She went through the change of life approximately age 49. She did not take hormone replacement. She never used birth control    SOCIAL HISTORY:  The patient and her husband Dominica Severin are both retired. They live on 90 acres and are quite busy caring for that. At is also Irine's chief source of exercise. Daughter Jamesetta So is an occupational Programmer, applications in [place]. Daughter Rolly Salter is a Cabin crew in [place]. Son Jaslyne Beeck works as a Librarian, academic for the [employer] in [place]. The patient has 4 grandchildren. She is a Psychologist, forensic.    ADVANCED DIRECTIVES: In place   HEALTH MAINTENANCE: Social History   Tobacco Use   Smoking status: Never   Smokeless tobacco: Never  Vaping Use   Vaping Use: Never used  Substance Use Topics   Alcohol use: No   Drug use: No     Colonoscopy: 05/14/2018, negative for dysplasia  PAP: 2015  Bone density: 05/29/2018, 1.5  Lipid panel:  Allergies  Allergen Reactions   Oxycontin [Oxycodone Hcl] Shortness Of Breath   Sulfa Antibiotics Itching   Current Outpatient Medications  Medication Sig Dispense  Refill   Biotin 1000 MCG tablet Take 1,000 mcg by mouth daily.     Blood Glucose Monitoring Suppl (ONE TOUCH ULTRA 2) w/Device KIT USE AS DIRECTED     cetirizine (ZYRTEC) 10 MG tablet Take 10 mg by mouth.     Cholecalciferol (VITAMIN D) 2000 units tablet Take by mouth.     Garlic 2671 MG CAPS Take by mouth.     glucosamine-chondroitin 500-400 MG tablet Take 1 tablet by mouth daily.     Lancets (ONETOUCH DELICA PLUS [id number]) MISC USE TO CHECK BLOOD SUGAR  DAILY 100 each 3   metFORMIN (GLUCOPHAGE-XR) 500 MG 24 hr tablet Take 1 tablet (500 mg total) by mouth 2 (two) times daily. 180 tablet 3   Multiple Vitamins-Minerals (MULTIVITAMIN WOMEN 50+ PO) Take 1 tablet by mouth daily.      ONETOUCH ULTRA test strip USE ONCE DAILY 100 strip 3   pravastatin (PRAVACHOL) 20 MG tablet TAKE 1 TABLET BY MOUTH  DAILY 90 tablet 3   tamoxifen (NOLVADEX) 20 MG tablet Take 1 tablet (20 mg total) by mouth daily. 90 tablet 4   No current facility-administered medications for this visit.    OBJECTIVE: white woman who appears well  There were no vitals filed for this visit.   Sclerae unicteric, EOMs intact Wearing a mask No cervical or supraclavicular adenopathy Lungs no rales or rhonchi Heart regular rate and rhythm Abd soft, nontender, positive bowel sounds MSK no focal spinal tenderness, no upper extremity lymphedema Neuro: nonfocal, well oriented, appropriate affect Breasts: The right breast is benign per the left breast is status postlumpectomy and radiation.  There is no evidence of local recurrence.  Both axillae are benign   LAB RESULTS:  CMP     Component Value Date/Time   NA 135 03/06/2022 1447   NA 138 02/20/2017 1149   K 4.1 03/06/2022 1447   K 4.7 02/20/2017 1149   CL 97 03/06/2022 1447   CO2 29 03/06/2022 1447   CO2 27 02/20/2017 1149   GLUCOSE 109 (H) 03/06/2022 1447   GLUCOSE 192 (H) 02/20/2017 1149   BUN 12 03/06/2022 1447   BUN 14.7 02/20/2017 1149   CREATININE 0.85 03/06/2022 1447   CREATININE 0.85 05/17/2021 1016   CREATININE 0.85 02/04/2021 1450   CREATININE 0.9 02/20/2017 1149   CALCIUM 9.4 03/06/2022 1447   CALCIUM 9.5 02/20/2017 1149   PROT 6.6 05/17/2021 1016   PROT 6.6 02/20/2017 1149   ALBUMIN 3.9 05/17/2021 1016   ALBUMIN 3.9 02/20/2017 1149   AST 22 05/17/2021 1016   AST 16 02/20/2017 1149   ALT 14 05/17/2021 1016   ALT 12 02/20/2017 1149   ALKPHOS 20 (L) 05/17/2021 1016   ALKPHOS 19 (L) 02/20/2017 1149   BILITOT 0.3 05/17/2021 1016   BILITOT 0.60 02/20/2017 1149   GFRNONAA >60 05/17/2021 1016   GFRNONAA 68 02/04/2021 1450   GFRAA 79 02/04/2021 1450    No results found for: "TOTALPROTELP", "ALBUMINELP", "A1GS", "A2GS", "BETS",  "BETA2SER", "GAMS", "MSPIKE", "SPEI"  Lab Results  Component Value Date   WBC 5.1 05/17/2021   NEUTROABS 3.6 05/17/2021   HGB 12.3 05/17/2021   HCT 36.7 05/17/2021   MCV 91.8 05/17/2021   PLT 184 05/17/2021    No results found for: "LABCA2"  No components found for: "JASNKN397"  No results for input(s): "INR" in the last 168 hours.  No results found for: "LABCA2"  No results found for: "QBH419"  No results found for: "CAN125"  No results  found for: "CAN153"  No results found for: "CA2729"  No components found for: "HGQUANT"  No results found for: "CEA1", "CEA" / No results found for: "CEA1", "CEA"   No results found for: "AFPTUMOR"  No results found for: "CHROMOGRNA"  No results found for: "KPAFRELGTCHN", "LAMBDASER", "KAPLAMBRATIO" (kappa/lambda light chains)  No results found for: "HGBA", "HGBA2QUANT", "HGBFQUANT", "HGBSQUAN" (Hemoglobinopathy evaluation)   No results found for: "LDH"  No results found for: "IRON", "TIBC", "IRONPCTSAT" (Iron and TIBC)  No results found for: "FERRITIN"  Urinalysis No results found for: "COLORURINE", "APPEARANCEUR", "LABSPEC", "PHURINE", "GLUCOSEU", "HGBUR", "BILIRUBINUR", "KETONESUR", "PROTEINUR", "UROBILINOGEN", "NITRITE", "LEUKOCYTESUR"   STUDIES: No results found.   ASSESSMENT: 76 y.o. [place] woman status post left breast upper outer quadrant biopsy 01/18/2015 for a clinical T1b N0, stage IA invasive ductal carcinoma, grade 1, estrogen and progesterone receptor positive, HER-2 not amplified  (1) left lumpectomy and sentinel lymph node sampling 02/08/2015 showed a pT1b pN0, stage IA invasive ductal carcinoma, grade 1, with repeat HER-2 again negative. Margins were negative but close  (2) Oncotype DX showed a score of 14, predicting a risk of recurrence outside the breast in the next 10 years of 9% if the patient's only systemic treatment is tamoxifen for 5 years. It also protects no benefit from adjuvant  chemotherapy.  (3) adjuvant radiation completed in [facility] 05/11/2015  (4) tamoxifen started 05/20/2015  (a) bone density 05/29/2018 normal with a T score of 1.5  (5) TAH-BSO in December 2016. Benign pathology.  (6) left breast upper outer quadrant biopsy 03/08/2018 with no evidence of malignancy   PLAN:   *Total Encounter Time as defined by the Centers for Medicare and Medicaid Services includes, in addition to the face-to-face time of a patient visit (documented in the note above) non-face-to-face time: obtaining and reviewing outside history, ordering and reviewing medications, tests or procedures, care coordination (communications with other health care professionals or caregivers) and documentation in the medical record.

## 2022-07-26 ENCOUNTER — Other Ambulatory Visit: Payer: Self-pay | Admitting: Internal Medicine

## 2022-08-19 ENCOUNTER — Other Ambulatory Visit: Payer: Self-pay | Admitting: Internal Medicine

## 2022-09-06 ENCOUNTER — Ambulatory Visit: Payer: Medicare Other | Admitting: Family

## 2022-10-04 DIAGNOSIS — G3184 Mild cognitive impairment, so stated: Secondary | ICD-10-CM | POA: Diagnosis not present

## 2022-10-04 DIAGNOSIS — E785 Hyperlipidemia, unspecified: Secondary | ICD-10-CM | POA: Diagnosis not present

## 2022-10-04 DIAGNOSIS — Z8679 Personal history of other diseases of the circulatory system: Secondary | ICD-10-CM | POA: Diagnosis not present

## 2022-10-04 DIAGNOSIS — E538 Deficiency of other specified B group vitamins: Secondary | ICD-10-CM | POA: Diagnosis not present

## 2022-10-04 DIAGNOSIS — E1169 Type 2 diabetes mellitus with other specified complication: Secondary | ICD-10-CM | POA: Diagnosis not present

## 2022-10-04 DIAGNOSIS — Z87820 Personal history of traumatic brain injury: Secondary | ICD-10-CM | POA: Diagnosis not present

## 2022-10-10 ENCOUNTER — Other Ambulatory Visit (HOSPITAL_COMMUNITY): Payer: Self-pay | Admitting: Neurology

## 2022-10-10 DIAGNOSIS — G3184 Mild cognitive impairment, so stated: Secondary | ICD-10-CM

## 2022-10-26 ENCOUNTER — Ambulatory Visit (HOSPITAL_COMMUNITY)
Admission: RE | Admit: 2022-10-26 | Discharge: 2022-10-26 | Disposition: A | Discharge status: Home / Self Care | Payer: Medicare Other | Source: Ambulatory Visit | Attending: Neurology | Admitting: Neurology

## 2022-10-26 DIAGNOSIS — G3184 Mild cognitive impairment, so stated: Secondary | ICD-10-CM | POA: Insufficient documentation

## 2022-10-26 DIAGNOSIS — G319 Degenerative disease of nervous system, unspecified: Secondary | ICD-10-CM | POA: Diagnosis not present

## 2022-12-22 DIAGNOSIS — G301 Alzheimer's disease with late onset: Secondary | ICD-10-CM | POA: Diagnosis not present

## 2022-12-22 DIAGNOSIS — Z87820 Personal history of traumatic brain injury: Secondary | ICD-10-CM | POA: Diagnosis not present

## 2022-12-22 DIAGNOSIS — E1169 Type 2 diabetes mellitus with other specified complication: Secondary | ICD-10-CM | POA: Diagnosis not present

## 2022-12-22 DIAGNOSIS — E538 Deficiency of other specified B group vitamins: Secondary | ICD-10-CM | POA: Diagnosis not present

## 2022-12-22 DIAGNOSIS — D329 Benign neoplasm of meninges, unspecified: Secondary | ICD-10-CM | POA: Diagnosis not present

## 2022-12-22 DIAGNOSIS — Z8679 Personal history of other diseases of the circulatory system: Secondary | ICD-10-CM | POA: Diagnosis not present

## 2022-12-22 DIAGNOSIS — E785 Hyperlipidemia, unspecified: Secondary | ICD-10-CM | POA: Diagnosis not present

## 2023-01-08 DIAGNOSIS — H01003 Unspecified blepharitis right eye, unspecified eyelid: Secondary | ICD-10-CM | POA: Diagnosis not present

## 2023-01-08 DIAGNOSIS — Z961 Presence of intraocular lens: Secondary | ICD-10-CM | POA: Diagnosis not present

## 2023-01-15 DIAGNOSIS — Z961 Presence of intraocular lens: Secondary | ICD-10-CM | POA: Diagnosis not present

## 2023-01-15 DIAGNOSIS — H01003 Unspecified blepharitis right eye, unspecified eyelid: Secondary | ICD-10-CM | POA: Diagnosis not present

## 2023-02-12 DIAGNOSIS — M5416 Radiculopathy, lumbar region: Secondary | ICD-10-CM | POA: Diagnosis not present

## 2023-02-12 DIAGNOSIS — M47896 Other spondylosis, lumbar region: Secondary | ICD-10-CM | POA: Diagnosis not present

## 2023-02-12 DIAGNOSIS — M47816 Spondylosis without myelopathy or radiculopathy, lumbar region: Secondary | ICD-10-CM | POA: Insufficient documentation

## 2023-02-12 DIAGNOSIS — M545 Low back pain, unspecified: Secondary | ICD-10-CM | POA: Insufficient documentation

## 2023-02-12 DIAGNOSIS — M25561 Pain in right knee: Secondary | ICD-10-CM | POA: Diagnosis not present

## 2023-02-13 ENCOUNTER — Telehealth: Payer: Self-pay | Admitting: Family

## 2023-02-13 NOTE — Telephone Encounter (Signed)
Pt daughter walked in today staying that her mom its having a TOC with Konrad Dolores next week, however, she drop off a letter(note) for provider to have or put it in pt chart. Letter its at front in color folder.

## 2023-02-13 NOTE — Telephone Encounter (Signed)
Form placed in scan bin

## 2023-02-20 ENCOUNTER — Other Ambulatory Visit: Payer: Self-pay | Admitting: Nurse Practitioner

## 2023-02-20 ENCOUNTER — Ambulatory Visit (INDEPENDENT_AMBULATORY_CARE_PROVIDER_SITE_OTHER): Payer: Medicare Other | Admitting: Nurse Practitioner

## 2023-02-20 ENCOUNTER — Encounter: Payer: Self-pay | Admitting: Nurse Practitioner

## 2023-02-20 VITALS — BP 120/70 | HR 69 | Temp 98.5°F | Ht 66.0 in | Wt 173.0 lb

## 2023-02-20 DIAGNOSIS — E785 Hyperlipidemia, unspecified: Secondary | ICD-10-CM

## 2023-02-20 DIAGNOSIS — Z1231 Encounter for screening mammogram for malignant neoplasm of breast: Secondary | ICD-10-CM

## 2023-02-20 DIAGNOSIS — Z1211 Encounter for screening for malignant neoplasm of colon: Secondary | ICD-10-CM

## 2023-02-20 DIAGNOSIS — E1169 Type 2 diabetes mellitus with other specified complication: Secondary | ICD-10-CM | POA: Diagnosis not present

## 2023-02-20 DIAGNOSIS — Z853 Personal history of malignant neoplasm of breast: Secondary | ICD-10-CM | POA: Diagnosis not present

## 2023-02-20 DIAGNOSIS — E559 Vitamin D deficiency, unspecified: Secondary | ICD-10-CM

## 2023-02-20 DIAGNOSIS — E119 Type 2 diabetes mellitus without complications: Secondary | ICD-10-CM

## 2023-02-20 DIAGNOSIS — F0280 Dementia in other diseases classified elsewhere without behavioral disturbance: Secondary | ICD-10-CM | POA: Insufficient documentation

## 2023-02-20 DIAGNOSIS — G301 Alzheimer's disease with late onset: Secondary | ICD-10-CM

## 2023-02-20 DIAGNOSIS — D519 Vitamin B12 deficiency anemia, unspecified: Secondary | ICD-10-CM

## 2023-02-20 DIAGNOSIS — Z1329 Encounter for screening for other suspected endocrine disorder: Secondary | ICD-10-CM | POA: Diagnosis not present

## 2023-02-20 LAB — VITAMIN D 25 HYDROXY (VIT D DEFICIENCY, FRACTURES): VITD: 55.77 ng/mL (ref 30.00–100.00)

## 2023-02-20 LAB — COMPREHENSIVE METABOLIC PANEL
ALT: 14 U/L (ref 0–35)
AST: 15 U/L (ref 0–37)
Albumin: 4.2 g/dL (ref 3.5–5.2)
Alkaline Phosphatase: 27 U/L — ABNORMAL LOW (ref 39–117)
BUN: 21 mg/dL (ref 6–23)
CO2: 31 mEq/L (ref 19–32)
Calcium: 9 mg/dL (ref 8.4–10.5)
Chloride: 100 mEq/L (ref 96–112)
Creatinine, Ser: 0.79 mg/dL (ref 0.40–1.20)
GFR: 73.21 mL/min (ref >60.00)
Glucose, Bld: 122 mg/dL — ABNORMAL HIGH (ref 70–99)
Potassium: 4.2 mEq/L (ref 3.5–5.1)
Sodium: 138 mEq/L (ref 135–145)
Total Bilirubin: 0.4 mg/dL (ref 0.2–1.2)
Total Protein: 6.3 g/dL (ref 6.0–8.3)

## 2023-02-20 LAB — CBC WITH DIFFERENTIAL/PLATELET
Basophils Absolute: 0.1 10*3/uL (ref 0.0–0.1)
Basophils Relative: 1 % (ref 0.0–3.0)
Eosinophils Absolute: 0.1 10*3/uL (ref 0.0–0.7)
Eosinophils Relative: 1.1 % (ref 0.0–5.0)
HCT: 39.4 % (ref 36.0–46.0)
Hemoglobin: 13.2 g/dL (ref 12.0–15.0)
Lymphocytes Relative: 22.9 % (ref 12.0–46.0)
Lymphs Abs: 1.7 10*3/uL (ref 0.7–4.0)
MCHC: 33.6 g/dL (ref 30.0–36.0)
MCV: 89.4 fl (ref 78.0–100.0)
Monocytes Absolute: 0.7 10*3/uL (ref 0.1–1.0)
Monocytes Relative: 9.2 % (ref 3.0–12.0)
Neutro Abs: 4.9 10*3/uL (ref 1.4–7.7)
Neutrophils Relative %: 65.8 % (ref 43.0–77.0)
Platelets: 225 10*3/uL (ref 150.0–400.0)
RBC: 4.41 Mil/uL (ref 3.87–5.11)
RDW: 13.4 % (ref 11.5–15.5)
WBC: 7.4 10*3/uL (ref 4.0–10.5)

## 2023-02-20 LAB — HEMOGLOBIN A1C: Hgb A1c MFr Bld: 7.5 % — ABNORMAL HIGH (ref 4.6–6.5)

## 2023-02-20 LAB — LIPID PANEL
Cholesterol: 172 mg/dL (ref 0–200)
HDL: 61.5 mg/dL (ref >39.00)
LDL Cholesterol: 97 mg/dL (ref 0–99)
NonHDL: 110.5
Total CHOL/HDL Ratio: 3
Triglycerides: 67 mg/dL (ref 0.0–149.0)
VLDL: 13.4 mg/dL (ref 0.0–40.0)

## 2023-02-20 LAB — VITAMIN B12: Vitamin B-12: 675 pg/mL (ref 211–911)

## 2023-02-20 LAB — TSH: TSH: 1.48 u[IU]/mL (ref 0.35–5.50)

## 2023-02-20 MED ORDER — PRAVASTATIN SODIUM 20 MG PO TABS: 20.0000 mg | ORAL_TABLET | Freq: Every day | ORAL | 3 refills | Status: DC

## 2023-02-20 MED ORDER — METFORMIN HCL ER 500 MG PO TB24
500.0000 mg | ORAL_TABLET | Freq: Two times a day (BID) | ORAL | 3 refills | Status: DC
Start: 2023-02-20 — End: 2024-06-03

## 2023-02-20 NOTE — Assessment & Plan Note (Signed)
Newly diagnosed by [facility]. Taking Aricept 5 mg QHS. Follow up as scheduled.

## 2023-02-20 NOTE — Progress Notes (Signed)
[name], NP-C Phone: [phone]  TOYA DALRYMPLE is a 77 y.o. female who presents today for transfer of care. She is present with her daughter, Maxie Better. She has no complaints or new concerns today. She has been out of her Metformin and Pravastatin and is requesting refills. She is followed by Neurology and has been newly diagnosed with Alzheimer's.   She has a Hx of breast cancer in the left breast. She is currently taking Tamoxifen. Her oncologist retired, she is requesting a referral to a new one for management of her medication. She is due for a mammogram in June.   DIABETES Disease Monitoring: Blood Sugar ranges- Not checking Polyuria/phagia/dipsia- No      Optho- Yes Medications: Compliance- Metformin Hypoglycemic symptoms- No Lab Results  Component Value Date   HGBA1C 6.9 (H) 03/06/2022   HYPERLIPIDEMIA Symptoms Chest pain on exertion:  No   Leg claudication:   No Medications: Compliance- Pravastatin Right upper quadrant pain- No  Muscle aches- No Lipid Panel     Component Value Date/Time   CHOL 158 03/06/2022 1447   TRIG 86.0 03/06/2022 1447   HDL 64.60 03/06/2022 1447   CHOLHDL 2 03/06/2022 1447   VLDL 17.2 03/06/2022 1447   LDLCALC 77 03/06/2022 1447    Social History   Tobacco Use  Smoking Status Never  Smokeless Tobacco Never    Current Outpatient Medications on File Prior to Visit  Medication Sig Dispense Refill   cetirizine (ZYRTEC) 10 MG tablet Take 10 mg by mouth.     Cholecalciferol (VITAMIN D) 2000 units tablet Take by mouth.     donepezil (ARICEPT) 5 MG tablet Take 5 mg by mouth every morning.     Lancets (ONETOUCH DELICA PLUS LANCET33G) MISC USE TO CHECK BLOOD SUGAR  DAILY 100 each 3   Multiple Vitamins-Minerals (MULTIVITAMIN WOMEN 50+ PO) Take 1 tablet by mouth daily.     ONETOUCH ULTRA test strip USE ONCE DAILY 100 strip 3   tamoxifen (NOLVADEX) 20 MG tablet Take 1 tablet (20 mg total) by mouth daily. 90 tablet 4   No current  facility-administered medications on file prior to visit.    ROS see history of present illness  Objective  Physical Exam Vitals:   02/20/23 1300  BP: 120/70  Pulse: 69  Temp: 98.5 F (36.9 C)  SpO2: 95%    BP Readings from Last 3 Encounters:  02/20/23 120/70  03/06/22 (!) 142/76  08/09/21 126/70   Wt Readings from Last 3 Encounters:  02/20/23 173 lb (78.5 kg)  03/06/22 179 lb 8 oz (81.4 kg)  08/09/21 166 lb 3.2 oz (75.4 kg)    Physical Exam Constitutional:      General: She is not in acute distress.    Appearance: Normal appearance.  HENT:     Head: Normocephalic.  Cardiovascular:     Rate and Rhythm: Normal rate and regular rhythm.     Heart sounds: Normal heart sounds.  Pulmonary:     Effort: Pulmonary effort is normal.     Breath sounds: Normal breath sounds.  Abdominal:     General: Abdomen is flat. Bowel sounds are normal.     Palpations: Abdomen is soft.     Tenderness: There is no abdominal tenderness.  Skin:    General: Skin is warm and dry.  Neurological:     General: No focal deficit present.     Mental Status: She is alert.  Psychiatric:        Mood and  Affect: Mood normal.        Behavior: Behavior normal.    Assessment/Plan: Please see individual problem list.  Diabetes mellitus type II, non insulin dependent (HCC) Assessment & Plan: Chronic. Stable on Metformin 500 mg BID. Continue. Refills sent. Will check A1c today. Encouraged healthy diet and exercise as tolerated.   Orders: -     metFORMIN HCl ER; Take 1 tablet (500 mg total) by mouth 2 (two) times daily.  Dispense: 180 tablet; Refill: 3 -     Comprehensive metabolic panel -     Hemoglobin A1c  Hyperlipidemia associated with type 2 diabetes mellitus -     Pravastatin Sodium; Take 1 tablet (20 mg total) by mouth daily.  Dispense: 90 tablet; Refill: 3 -     Lipid panel  Alzheimer's disease with late onset Assessment & Plan: Newly diagnosed by [facility]. Taking Aricept 5  mg QHS. Follow up as scheduled.    History of breast cancer Assessment & Plan: Currently taking Tamoxifen 20 mg daily. Continue. Per last Oncology note- plan to be on Tamoxifen for 10 years, annual follow ups. Oncologist retired, referral placed to Oncology to establish care with new provider. Mammogram due in June, order placed. Encouraged to call to schedule.   Orders: -     Ambulatory referral to Hematology / Oncology -     3D Screening Mammogram, Left and Right; Future  Vitamin D deficiency, unspecified Assessment & Plan: Chronic. Taking OTC Vitamin D supplement daily. Continue. Will check vitamin D level.  Orders: -     VITAMIN D 25 Hydroxy (Vit-D Deficiency, Fractures)  Anemia due to vitamin B12 deficiency, unspecified B12 deficiency type Assessment & Plan: Chronic. Taking OTC B12 supplement. Continue. Will check B12 level.   Orders: -     CBC with Differential/Platelet -     Vitamin B12  Thyroid disorder screen -     TSH  Screening mammogram for breast cancer -     3D Screening Mammogram, Left and Right; Future  Screen for colon cancer -     Ambulatory referral to Gastroenterology   Return in about 6 months (around 08/22/2023) for Follow up.   [name], NP-C [facility] [employer]

## 2023-02-20 NOTE — Assessment & Plan Note (Signed)
Chronic. Taking OTC B12 supplement. Continue. Will check B12 level.

## 2023-02-20 NOTE — Assessment & Plan Note (Signed)
Chronic. Taking OTC Vitamin D supplement daily. Continue. Will check vitamin D level.

## 2023-02-20 NOTE — Assessment & Plan Note (Signed)
Chronic. Stable on Metformin 500 mg BID. Continue. Refills sent. Will check A1c today. Encouraged healthy diet and exercise as tolerated.

## 2023-02-20 NOTE — Patient Instructions (Signed)
YOUR MAMMOGRAM IS DUE, PLEASE CALL AND GET THIS SCHEDULED! [facility] - call [phone]    

## 2023-02-20 NOTE — Assessment & Plan Note (Addendum)
Currently taking Tamoxifen 20 mg daily. Continue. Per last Oncology note- plan to be on Tamoxifen for 10 years, annual follow ups. Oncologist retired, referral placed to Oncology to establish care with new provider. Mammogram due in June, order placed. Encouraged to call to schedule.

## 2023-02-26 ENCOUNTER — Telehealth: Payer: Self-pay

## 2023-02-26 DIAGNOSIS — M545 Low back pain, unspecified: Secondary | ICD-10-CM | POA: Diagnosis not present

## 2023-02-26 DIAGNOSIS — M5416 Radiculopathy, lumbar region: Secondary | ICD-10-CM | POA: Diagnosis not present

## 2023-02-26 NOTE — Telephone Encounter (Signed)
Daughter said there was a VM left on pt's VM to schedule colonoscopy, pt has alzeimer's so she does not understand... You need to call the daughter Maxie Better to schedule  She is not on [facility]

## 2023-02-27 ENCOUNTER — Inpatient Hospital Stay: Payer: Medicare Other | Attending: Internal Medicine | Admitting: Internal Medicine

## 2023-02-27 ENCOUNTER — Inpatient Hospital Stay: Payer: Medicare Other

## 2023-02-27 ENCOUNTER — Encounter: Payer: Self-pay | Admitting: Internal Medicine

## 2023-02-27 VITALS — BP 133/71 | HR 76 | Temp 97.6°F | Resp 20 | Wt 178.0 lb

## 2023-02-27 DIAGNOSIS — Z79899 Other long term (current) drug therapy: Secondary | ICD-10-CM | POA: Insufficient documentation

## 2023-02-27 DIAGNOSIS — Z853 Personal history of malignant neoplasm of breast: Secondary | ICD-10-CM | POA: Insufficient documentation

## 2023-02-27 DIAGNOSIS — Z923 Personal history of irradiation: Secondary | ICD-10-CM | POA: Insufficient documentation

## 2023-02-27 DIAGNOSIS — Z808 Family history of malignant neoplasm of other organs or systems: Secondary | ICD-10-CM | POA: Diagnosis not present

## 2023-02-27 DIAGNOSIS — Z803 Family history of malignant neoplasm of breast: Secondary | ICD-10-CM | POA: Diagnosis not present

## 2023-02-27 DIAGNOSIS — C50812 Malignant neoplasm of overlapping sites of left female breast: Secondary | ICD-10-CM

## 2023-02-27 DIAGNOSIS — Z800 Family history of malignant neoplasm of digestive organs: Secondary | ICD-10-CM | POA: Diagnosis not present

## 2023-02-27 DIAGNOSIS — E785 Hyperlipidemia, unspecified: Secondary | ICD-10-CM | POA: Diagnosis not present

## 2023-02-27 DIAGNOSIS — E119 Type 2 diabetes mellitus without complications: Secondary | ICD-10-CM | POA: Insufficient documentation

## 2023-02-27 DIAGNOSIS — Z79810 Long term (current) use of selective estrogen receptor modulators (SERMs): Secondary | ICD-10-CM

## 2023-02-27 NOTE — Progress Notes (Signed)
Patient has no concerns 

## 2023-02-27 NOTE — Progress Notes (Addendum)
[facility] CONSULT NOTE  Patient Care Team: [name], NP as PCP - General (Nurse Practitioner) Carlus Pavlov, MD as Consulting Physician (Internal Medicine) Wyline Mood, MD as Consulting Physician (Gastroenterology) Magrinat, Valentino Hue, MD (Inactive) as Consulting Physician (Oncology)  REFERRING PROVIDER: [name], NP  REASON FOR REFFERAL: History of left breast cancer diagnosed in March 2016  CANCER STAGING   Cancer Staging  Breast cancer Staging form: Breast, AJCC 7th Edition - Clinical: Stage IA (T1b, N0, M0) - Signed by Michaelyn Barter, MD on 02/27/2023 - Pathologic stage from 02/10/2015: Stage IA (T1b, N0, cM0) - Signed by Michaelyn Barter, MD on 02/27/2023 Staged by: Pathologist Laterality: Left Estrogen receptor status: Positive Progesterone receptor status: Positive HER2 status: Negative Stage used in treatment planning: Yes National guidelines used in treatment planning: Yes Type of national guideline used in treatment planning: NCCN Staging comments: Staged on final lumpectomy specimen by Dr. Colonel Bald   ASSESSMENT & PLAN:  [name] 77 y.o. female with pmh of Diabetes, hyperlipidemia, mild cognitive impairment was referred to medical oncology for history of left breast stage IA ER/PR positive and HER2 negative invasive ductal cancer.  # Left breast IDC, stage IA (cT1bN0), ER/PR positive, HER2 negative -Patient has a history of left breast invasive ductal cancer stage IA (cT1b N0) ER/PR positive and HER2 negative diagnosed on 01/18/2015 status post left lumpectomy and sentinel lymph node biopsy on 02/08/2015.  Pathology showed invasive ductal carcinoma, 0.9 cm, overall grade 1, DCIS low-grade present, margins negative but close 0.1 cm for DCIS.  -Oncotype Dx showed recurrence score of 14.  No benefit from adjuvant chemotherapy.  -She completed adjuvant radiation on 05/11/2015  -Started tamoxifen on 05/20/2015.  Of chart she was seen by Dr. Raymond Gurney last  in July 2022.  At that time he had discussed about discontinuing tamoxifen but patient decided to continue it and the plan was to do it for 10 years.  -Her medical oncologist has retired.  She continues to be on tamoxifen and is close to completing 8 years.  Has been tolerating well without any concerns.  I rediscussed with the patient about her cancer being early stage and grade 1- tamoxifen for 5 years should be sufficient.  Also discussed about increased risk of thromboembolic events, increased risk of endometrial cancer and modest increase in stroke with long-term use of tamoxifen.  Patient was also looking into discontinuing tamoxifen.  She will go ahead and stop taking tamoxifen starting today.  She is getting annual screening mammogram from her primary.  Last one from June 2023 was negative for malignancy.  She is scheduled for another in July 2024.  Patient will continue to follow with her primary.  She will see me as needed.  No labs today.  RTC as needed Cc [name], NP No orders of the defined types were placed in this encounter.   The total time spent in the appointment was 55 minutes encounter with patients including review of chart and various tests results, discussions about plan of care and coordination of care plan   All questions were answered. The patient knows to call the clinic with any problems, questions or concerns. No barriers to learning was detected.  Michaelyn Barter, MD 4/23/202411:29 AM   HISTORY OF PRESENTING ILLNESS:  Gabriela [name] 77 y.o. female with pmh of Diabetes, hyperlipidemia, mild cognitive impairment was referred to medical oncology for history of left breast stage IA ER/PR positive and HER2 negative invasive ductal cancer.  Patient  has a history of left breast invasive ductal cancer stage IA (cT1b N0) ER/PR positive and HER2 negative diagnosed on 01/18/2015 status post left lumpectomy and sentinel lymph node biopsy on 02/08/2015.  Pathology showed  invasive ductal carcinoma, 0.9 cm, overall grade 1, DCIS low-grade present, margins negative but close 0.1 cm for DCIS.  Oncotype Dx showed recurrence score of 14.  No benefit from adjuvant chemotherapy.  She completed adjuvant radiation on 05/11/2015  Started tamoxifen on 05/20/2015.  Of chart she was seen by Dr. Raymond Gurney last in July 2022.  At that time he had discussed about discontinuing tamoxifen but patient decided to continue it and the plan was to do it for 10 years.  I have reviewed her chart and materials related to her cancer extensively and collaborated history with the patient. Summary of oncologic history is as follows: Oncology History  Malignant neoplasm of upper-outer quadrant of left breast in female, estrogen receptor positive (Resolved)  05/17/2021 Initial Diagnosis   Malignant neoplasm of upper-outer quadrant of left breast in female, estrogen receptor positive (HCC)   05/17/2021 Cancer Staging   Staging form: Breast, AJCC 8th Edition - Clinical: Stage IA (cT1b, cN0, cM0, G1, ER+, PR+, HER2-) - Signed by Lowella Dell, MD on 05/17/2021 Histologic grading system: 3 grade system     MEDICAL HISTORY:  Past Medical History:  Diagnosis Date   Arthritis    Cancer    left breast   Chronic pain    from fall 2012   Diabetes mellitus without complication    diet controlled   History of closed head injury 2012   accident work threw her 71ft   Hyperlipemia    Malignant neoplasm of upper-outer quadrant of left breast in female, estrogen receptor positive 05/17/2021   Personal history of radiation therapy    Spinal stenosis    Urinary incontinence    wears a pessery    SURGICAL HISTORY: Past Surgical History:  Procedure Laterality Date   ABDOMINAL HYSTERECTOMY     BREAST LUMPECTOMY  1999   left   BREAST LUMPECTOMY Left 2016   BREAST LUMPECTOMY WITH RADIOACTIVE SEED LOCALIZATION Left 03/08/2018   Procedure: LEFT BREAST LUMPECTOMY WITH RADIOACTIVE SEED LOCALIZATION;   Surgeon: Glenna Fellows, MD;  Location: [facility];  Service: General;  Laterality: Left;   CATARACT EXTRACTION W/PHACO Left 10/08/2019   Procedure: CATARACT EXTRACTION PHACO AND INTRAOCULAR LENS PLACEMENT (IOC) LEFT 3.47, 00:46.0,  18.8%;  Surgeon: Lockie Mola, MD;  Location: [facility];  Service: Ophthalmology;  Laterality: Left;  Diabetes - oral meds   COLONOSCOPY     COLONOSCOPY WITH PROPOFOL N/A 05/14/2018   Procedure: COLONOSCOPY WITH PROPOFOL;  Surgeon: Wyline Mood, MD;  Location: [facility];  Service: Gastroenterology;  Laterality: N/A;   ORIF WRIST FRACTURE Right 06/15/2017   Procedure: OPEN REDUCTION INTERNAL FIXATION (ORIF) WRIST FRACTURE WITH REDUCTION AND PINNINGT OF CARPAL DISLOCATION;  Surgeon: Mack Hook, MD;  Location: [facility] OR;  Service: Orthopedics;  Laterality: Right;   RADIOACTIVE SEED GUIDED PARTIAL MASTECTOMY WITH AXILLARY SENTINEL LYMPH NODE BIOPSY Left 02/08/2015   Procedure: RADIOACTIVE SEED LOCALIZATION LUMPECTOMY WITH LEFT AXILLARY SENTINEL LYMPH NODE BIOPSY;  Surgeon: Glenna Fellows, MD;  Location: [facility];  Service: General;  Laterality: Left;   SHOULDER ARTHROSCOPY  2012   left   TONSILLECTOMY      SOCIAL HISTORY: Social History   Socioeconomic History   Marital status: Married    Spouse name: Jillyn Hidden   Number of children:  3   Years of education: some college   Highest education level: Not on file  Occupational History   Occupation: retired  Tobacco Use   Smoking status: Never   Smokeless tobacco: Never  Vaping Use   Vaping Use: Never used  Substance and Sexual Activity   Alcohol use: No   Drug use: No   Sexual activity: Not Currently    Partners: Male    Birth control/protection: Post-menopausal  Other Topics Concern   Not on file  Social History Narrative   The patient and her husband Jillyn Hidden are both retired. They live on [address] acres in our quite busy caring for that. At is also [place]'s chief  source of exercise. She is a Control and instrumentation engineer.      02/09/21   From: the are   Living: with Jillyn Hidden, 1970   Work: retired - [employer]      Family:Daughter Cecil Cranker is an Sports administrator in [place]. Daughter Albertine Grates is a Veterinary surgeon in [place]. Son Elisheba Mcdonnell works for the city of [place]. The patient has 4 grandchildren.      Enjoys: spend time with husband, gardening, and canning veggies/figs/jelly      Exercise: gardening and mowing the yard   Diet: healthy diabetic      Safety   Seat belts: Yes    Guns: Yes  and secure   Safe in relationships: Yes    Social Determinants of Health   Financial Resource Strain: Low Risk  (05/31/2020)   Overall Financial Resource Strain (CARDIA)    Difficulty of Paying Living Expenses: Not hard at all  Food Insecurity: No Food Insecurity (05/31/2020)   Hunger Vital Sign    Worried About Running Out of Food in the Last Year: Never true    Ran Out of Food in the Last Year: Never true  Transportation Needs: No Transportation Needs (05/31/2020)   PRAPARE - Administrator, Civil Service (Medical): No    Lack of Transportation (Non-Medical): No  Physical Activity: Insufficiently Active (05/31/2020)   Exercise Vital Sign    Days of Exercise per Week: 7 days    Minutes of Exercise per Session: 10 min  Stress: No Stress Concern Present (05/31/2020)   [employer] of Occupational Health - Occupational Stress Questionnaire    Feeling of Stress : Not at all  Social Connections: Not on file  Intimate Partner Violence: Not At Risk (05/31/2020)   Humiliation, Afraid, Rape, and Kick questionnaire    Fear of Current or Ex-Partner: No    Emotionally Abused: No    Physically Abused: No    Sexually Abused: No    FAMILY HISTORY: Family History  Problem Relation Age of Onset   Colon cancer Mother        possibly caused by medication during pregnancy   Breast cancer Maternal Aunt        dx in 77s   Cervical cancer Maternal Aunt         dx in 32s    ALLERGIES:  is allergic to oxycontin [oxycodone hcl], sulfur dioxide, and sulfa antibiotics.  MEDICATIONS:  Current Outpatient Medications  Medication Sig Dispense Refill   cetirizine (ZYRTEC) 10 MG tablet Take 10 mg by mouth.     Cholecalciferol (VITAMIN D) 2000 units tablet Take by mouth.     cyclobenzaprine (FLEXERIL) 5 MG tablet Take ONE tab PO BID/TID PRN     donepezil (ARICEPT) 5 MG tablet Take 5 mg by mouth every morning.  gabapentin (NEURONTIN) 300 MG capsule Take 1 capsule every day by oral route at bedtime.     Lancets (ONETOUCH DELICA PLUS LANCET33G) MISC USE TO CHECK BLOOD SUGAR  DAILY 100 each 3   meloxicam (MOBIC) 7.5 MG tablet Take 1 tablet every day by oral route.     metFORMIN (GLUCOPHAGE-XR) 500 MG 24 hr tablet Take 1 tablet (500 mg total) by mouth 2 (two) times daily. 180 tablet 3   Multiple Vitamins-Minerals (MULTIVITAMIN WOMEN 50+ PO) Take 1 tablet by mouth daily.     ONETOUCH ULTRA test strip USE ONCE DAILY 100 strip 3   pravastatin (PRAVACHOL) 20 MG tablet Take 1 tablet (20 mg total) by mouth daily. 90 tablet 3   tamoxifen (NOLVADEX) 20 MG tablet Take 1 tablet (20 mg total) by mouth daily. 90 tablet 4   No current facility-administered medications for this visit.    REVIEW OF SYSTEMS:   Pertinent information mentioned in HPI All other systems were reviewed with the patient and are negative.  PHYSICAL EXAMINATION: ECOG PERFORMANCE STATUS: 0 - Asymptomatic  Vitals:   02/27/23 1051  BP: 133/71  Pulse: 76  Resp: 20  Temp: 97.6 F (36.4 C)  SpO2: 100%   Filed Weights   02/27/23 1051  Weight: 178 lb (80.7 kg)    GENERAL:alert, no distress and comfortable SKIN: skin color, texture, turgor are normal, no rashes or significant lesions EYES: normal, conjunctiva are pink and non-injected, sclera clear OROPHARYNX:no exudate, no erythema and lips, buccal mucosa, and tongue normal  NECK: supple, thyroid normal size, non-tender, without  nodularity LYMPH:  no palpable lymphadenopathy in the cervical, axillary or inguinal LUNGS: clear to auscultation and percussion with normal breathing effort HEART: regular rate & rhythm and no murmurs and no lower extremity edema ABDOMEN:abdomen soft, non-tender and normal bowel sounds Musculoskeletal:no cyanosis of digits and no clubbing  PSYCH: alert & oriented x 3 with fluent speech NEURO: no focal motor/sensory deficits  LABORATORY DATA:  I have reviewed the data as listed Lab Results  Component Value Date   WBC 7.4 02/20/2023   HGB 13.2 02/20/2023   HCT 39.4 02/20/2023   MCV 89.4 02/20/2023   PLT 225.0 02/20/2023   Recent Labs    03/06/22 1447 02/20/23 1319  NA 135 138  K 4.1 4.2  CL 97 100  CO2 29 31  GLUCOSE 109* 122*  BUN 12 21  CREATININE 0.85 0.79  CALCIUM 9.4 9.0  PROT  --  6.3  ALBUMIN  --  4.2  AST  --  15  ALT  --  14  ALKPHOS  --  27*  BILITOT  --  0.4    RADIOGRAPHIC STUDIES: I have personally reviewed the radiological images as listed and agreed with the findings in the report. No results found.

## 2023-02-27 NOTE — Telephone Encounter (Signed)
Returned patients daughter Lila's phone call.  Explained to her that I will need her mother to complete DPR with [place] allowing [place] to speak with her regarding her healthcare.  In the meanwhile I can schedule with both she and her mother on the phone and ask her mother -prior to scheduling if its okay to allow her daughter to schedule her colonoscopy.  Thanks, [place], [place]

## 2023-03-01 DIAGNOSIS — H04203 Unspecified epiphora, bilateral lacrimal glands: Secondary | ICD-10-CM | POA: Diagnosis not present

## 2023-03-01 DIAGNOSIS — H11823 Conjunctivochalasis, bilateral: Secondary | ICD-10-CM | POA: Diagnosis not present

## 2023-03-01 DIAGNOSIS — H02132 Senile ectropion of right lower eyelid: Secondary | ICD-10-CM | POA: Diagnosis not present

## 2023-03-01 LAB — HM DIABETES EYE EXAM

## 2023-03-07 DIAGNOSIS — M5416 Radiculopathy, lumbar region: Secondary | ICD-10-CM | POA: Diagnosis not present

## 2023-03-16 DIAGNOSIS — M5416 Radiculopathy, lumbar region: Secondary | ICD-10-CM | POA: Diagnosis not present

## 2023-03-28 ENCOUNTER — Other Ambulatory Visit: Payer: Self-pay | Admitting: Hematology and Oncology

## 2023-03-28 ENCOUNTER — Other Ambulatory Visit: Payer: Self-pay | Admitting: Internal Medicine

## 2023-03-29 ENCOUNTER — Other Ambulatory Visit: Payer: Self-pay | Admitting: Internal Medicine

## 2023-04-05 DIAGNOSIS — M19072 Primary osteoarthritis, left ankle and foot: Secondary | ICD-10-CM | POA: Diagnosis not present

## 2023-04-05 DIAGNOSIS — M19079 Primary osteoarthritis, unspecified ankle and foot: Secondary | ICD-10-CM | POA: Diagnosis not present

## 2023-04-05 DIAGNOSIS — S93402A Sprain of unspecified ligament of left ankle, initial encounter: Secondary | ICD-10-CM | POA: Insufficient documentation

## 2023-04-05 DIAGNOSIS — M79672 Pain in left foot: Secondary | ICD-10-CM | POA: Diagnosis not present

## 2023-04-05 DIAGNOSIS — M25572 Pain in left ankle and joints of left foot: Secondary | ICD-10-CM | POA: Diagnosis not present

## 2023-05-03 DIAGNOSIS — S93402A Sprain of unspecified ligament of left ankle, initial encounter: Secondary | ICD-10-CM | POA: Diagnosis not present

## 2023-05-03 DIAGNOSIS — M25472 Effusion, left ankle: Secondary | ICD-10-CM | POA: Diagnosis not present

## 2023-05-03 DIAGNOSIS — M19072 Primary osteoarthritis, left ankle and foot: Secondary | ICD-10-CM | POA: Diagnosis not present

## 2023-05-03 DIAGNOSIS — M799 Soft tissue disorder, unspecified: Secondary | ICD-10-CM | POA: Diagnosis not present

## 2023-05-03 DIAGNOSIS — M25572 Pain in left ankle and joints of left foot: Secondary | ICD-10-CM | POA: Diagnosis not present

## 2023-05-07 ENCOUNTER — Ambulatory Visit
Admission: RE | Admit: 2023-05-07 | Discharge: 2023-05-07 | Disposition: A | Discharge status: Home / Self Care | Payer: Medicare Other | Source: Ambulatory Visit | Attending: Nurse Practitioner | Admitting: Nurse Practitioner

## 2023-05-07 DIAGNOSIS — Z853 Personal history of malignant neoplasm of breast: Secondary | ICD-10-CM | POA: Insufficient documentation

## 2023-05-07 DIAGNOSIS — Z1231 Encounter for screening mammogram for malignant neoplasm of breast: Secondary | ICD-10-CM | POA: Diagnosis not present

## 2023-05-23 ENCOUNTER — Other Ambulatory Visit: Payer: Medicare Other

## 2023-05-23 DIAGNOSIS — E119 Type 2 diabetes mellitus without complications: Secondary | ICD-10-CM | POA: Diagnosis not present

## 2023-05-23 LAB — HEMOGLOBIN A1C: Hgb A1c MFr Bld: 7 % — ABNORMAL HIGH (ref 4.6–6.5)

## 2023-06-04 ENCOUNTER — Ambulatory Visit (INDEPENDENT_AMBULATORY_CARE_PROVIDER_SITE_OTHER): Payer: Medicare Other | Admitting: *Deleted

## 2023-06-04 VITALS — Ht 66.0 in | Wt 178.0 lb

## 2023-06-04 DIAGNOSIS — E538 Deficiency of other specified B group vitamins: Secondary | ICD-10-CM | POA: Diagnosis not present

## 2023-06-04 DIAGNOSIS — G301 Alzheimer's disease with late onset: Secondary | ICD-10-CM | POA: Diagnosis not present

## 2023-06-04 DIAGNOSIS — Z0000 Encounter for general adult medical examination without abnormal findings: Secondary | ICD-10-CM

## 2023-06-04 NOTE — Progress Notes (Signed)
Subjective:   Gabriela [name] is a 77 y.o. female who presents for Medicare Annual (Subsequent) preventive examination.  Visit Complete: Virtual  I connected with  Gabriela [name] on 06/04/23 by a audio enabled telemedicine application and verified that I am speaking with the correct person using two identifiers.  Patient Location: Home  Provider Location: Office/Clinic  I discussed the limitations of evaluation and management by telemedicine. The patient expressed understanding and agreed to proceed.   Vital Signs: Unable to obtain new vitals due to this being a telehealth visit.   Review of Systems      Cardiac Risk Factors include: advanced age (>5men, >74 women);diabetes mellitus;dyslipidemia     Objective:    Today's Vitals   06/04/23 0904  Weight: 178 lb (80.7 kg)  Height: 5\' 6"  (1.676 m)   Body mass index is 28.73 kg/m.     06/04/2023    9:21 AM 02/27/2023   10:51 AM 05/31/2020    8:18 AM 10/08/2019   12:02 PM 04/10/2019   10:43 AM 09/28/2018    6:29 AM 05/14/2018    9:42 AM  Advanced Directives  Does Patient Have a Medical Advance Directive? Yes Yes Yes Yes Yes No Yes  Type of Estate agent of [place];Living will Living will;Healthcare Power of [employer] Power of [place];Living will Healthcare Power of [place];Living will Healthcare Power of [place];Living will  Living will;Healthcare Power of Attorney  Does patient want to make changes to medical advance directive? No - Patient declined Yes (ED - Information included in AVS)  No - Patient declined     Copy of Healthcare Power of Attorney in Chart? Yes - validated most recent copy scanned in chart (See row information)  No - copy requested Yes - validated most recent copy scanned in chart (See row information) No - copy requested    Would patient like information on creating a medical advance directive?  Yes (ED - Information included in AVS)    No - Patient declined     Current  Medications (verified) Outpatient Encounter Medications as of 06/04/2023  Medication Sig   cetirizine (ZYRTEC) 10 MG tablet Take 10 mg by mouth.   Cholecalciferol (VITAMIN D) 2000 units tablet Take by mouth.   cyclobenzaprine (FLEXERIL) 5 MG tablet Take ONE tab PO BID/TID PRN   donepezil (ARICEPT) 5 MG tablet Take 5 mg by mouth every morning.   gabapentin (NEURONTIN) 300 MG capsule Take 1 capsule every day by oral route at bedtime.   Lancets (ONETOUCH DELICA PLUS LANCET33G) MISC USE TO CHECK BLOOD SUGAR  DAILY   meloxicam (MOBIC) 7.5 MG tablet Take 1 tablet every day by oral route.   metFORMIN (GLUCOPHAGE-XR) 500 MG 24 hr tablet Take 1 tablet (500 mg total) by mouth 2 (two) times daily.   Multiple Vitamins-Minerals (MULTIVITAMIN WOMEN 50+ PO) Take 1 tablet by mouth daily.   ONETOUCH ULTRA test strip USE ONCE DAILY   pravastatin (PRAVACHOL) 20 MG tablet Take 1 tablet (20 mg total) by mouth daily.   tamoxifen (NOLVADEX) 20 MG tablet TAKE 1 TABLET BY MOUTH DAILY   No facility-administered encounter medications on file as of 06/04/2023.    Allergies (verified) Oxycontin [oxycodone hcl], Sulfur dioxide, and Sulfa antibiotics   History: Past Medical History:  Diagnosis Date   Arthritis    Cancer (HCC)    left breast   Chronic pain    from fall 2012   Diabetes mellitus without complication (HCC)  diet controlled   History of closed head injury 2012   accident work threw her 54ft   Hyperlipemia    Malignant neoplasm of upper-outer quadrant of left breast in female, estrogen receptor positive (HCC) 05/17/2021   Personal history of radiation therapy    Spinal stenosis    Urinary incontinence    wears a pessery   Past Surgical History:  Procedure Laterality Date   ABDOMINAL HYSTERECTOMY     BREAST LUMPECTOMY  1999   left   BREAST LUMPECTOMY Left 2016   BREAST LUMPECTOMY WITH RADIOACTIVE SEED LOCALIZATION Left 03/08/2018   Procedure: LEFT BREAST LUMPECTOMY WITH RADIOACTIVE SEED  LOCALIZATION;  Surgeon: Glenna Fellows, MD;  Location: [facility];  Service: General;  Laterality: Left;   CATARACT EXTRACTION W/PHACO Left 10/08/2019   Procedure: CATARACT EXTRACTION PHACO AND INTRAOCULAR LENS PLACEMENT (IOC) LEFT 3.47, 00:46.0,  18.8%;  Surgeon: Lockie Mola, MD;  Location[facility];  Service: Ophthalmology;  Laterality: Left;  Diabetes - oral meds   COLONOSCOPY     COLONOSCOPY WITH PROPOFOL N/A 05/14/2018   Procedure: COLONOSCOPY WITH PROPOFOL;  Surgeon: Wyline Mood, MD;  Location: [facility];  Service: Gastroenterology;  Laterality: N/A;   ORIF WRIST FRACTURE Right 06/15/2017   Procedure: OPEN REDUCTION INTERNAL FIXATION (ORIF) WRIST FRACTURE WITH REDUCTION AND PINNINGT OF CARPAL DISLOCATION;  Surgeon: Mack Hook, MD;  Location: [facility] OR;  Service: Orthopedics;  Laterality: Right;   RADIOACTIVE SEED GUIDED PARTIAL MASTECTOMY WITH AXILLARY SENTINEL LYMPH NODE BIOPSY Left 02/08/2015   Procedure: RADIOACTIVE SEED LOCALIZATION LUMPECTOMY WITH LEFT AXILLARY SENTINEL LYMPH NODE BIOPSY;  Surgeon: Glenna Fellows, MD;  Location: [facility];  Service: General;  Laterality: Left;   SHOULDER ARTHROSCOPY  2012   left   TONSILLECTOMY     Family History  Problem Relation Age of Onset   Colon cancer Mother        possibly caused by medication during pregnancy   Breast cancer Maternal Aunt        dx in 53s   Cervical cancer Maternal Aunt        dx in 70s   Social History   Socioeconomic History   Marital status: Married    Spouse name: Gabriela [name]   Number of children: 3   Years of education: some college   Highest education level: Not on file  Occupational History   Occupation: retired  Tobacco Use   Smoking status: Never   Smokeless tobacco: Never  Vaping Use   Vaping status: Never Used  Substance and Sexual Activity   Alcohol use: No   Drug use: No   Sexual activity: Not Currently    Partners: Male    Birth  control/protection: Post-menopausal  Other Topics Concern   Not on file  Social History Narrative   The patient and her husband Gabriela [name] are both retired. They live on [address] acres in our quite busy caring for that. At is also [place]'s chief source of exercise. She is a Control and instrumentation engineer.      02/09/21   From: the are   Living: with Gabriela [name], 1970   Work: retired - [employer]      Family:Daughter Cecil Cranker is an Sports administrator in [place]. Daughter Albertine Grates is a Veterinary surgeon in [place]. Son Tasheena Smyre works for the city of [place]. The patient has 4 grandchildren.      Enjoys: spend time with husband, gardening, and canning veggies/figs/jelly      Exercise: gardening and mowing the yard  Diet: healthy diabetic      Safety   Seat belts: Yes    Guns: Yes  and secure   Safe in relationships: Yes    Social Determinants of Health   Financial Resource Strain: Low Risk  (06/04/2023)   Overall Financial Resource Strain (CARDIA)    Difficulty of Paying Living Expenses: Not hard at all  Food Insecurity: No Food Insecurity (06/04/2023)   Hunger Vital Sign    Worried About Running Out of Food in the Last Year: Never true    Ran Out of Food in the Last Year: Never true  Transportation Needs: No Transportation Needs (06/04/2023)   PRAPARE - Administrator, Civil Service (Medical): No    Lack of Transportation (Non-Medical): No  Physical Activity: Inactive (06/04/2023)   Exercise Vital Sign    Days of Exercise per Week: 0 days    Minutes of Exercise per Session: 0 min  Stress: No Stress Concern Present (06/04/2023)   [employer] of Occupational Health - Occupational Stress Questionnaire    Feeling of Stress : Not at all  Social Connections: Socially Integrated (06/04/2023)   Social Connection and Isolation Panel [NHANES]    Frequency of Communication with Friends and Family: More than three times a week    Frequency of Social Gatherings with Friends and Family: More than three times  a week    Attends Religious Services: More than 4 times per year    Active Member of [employer] or Organizations: Yes    Attends Engineer, structural: More than 4 times per year    Marital Status: Married    Tobacco Counseling Counseling given: Not Answered   Clinical Intake:  Pre-visit preparation completed: Yes  Pain : No/denies pain     BMI - recorded: 28.73 Nutritional Status: BMI 25 -29 Overweight Nutritional Risks: None Diabetes: Yes CBG done?: No Did pt. bring in CBG monitor from home?: No  How often do you need to have someone help you when you read instructions, pamphlets, or other written materials from your doctor or pharmacy?: 1 - Never  Interpreter Needed?: No  Information entered by :: R. Caelin Rayl LPN   Activities of Daily Living    06/04/2023    9:07 AM  In your present state of health, do you have any difficulty performing the following activities:  Hearing? 0  Vision? 0  Comment readers  Difficulty concentrating or making decisions? 1  Comment a little with memeory  Walking or climbing stairs? 0  Dressing or bathing? 0  Doing errands, shopping? 0  Preparing Food and eating ? N  Using the Toilet? N  In the past six months, have you accidently leaked urine? N  Do you have problems with loss of bowel control? N  Managing your Medications? N  Managing your Finances? N  Housekeeping or managing your Housekeeping? N    Patient Care Team: [name], NP as PCP - General (Nurse Practitioner) Carlus Pavlov, MD as Consulting Physician (Internal Medicine) Wyline Mood, MD as Consulting Physician (Gastroenterology) Magrinat, Valentino Hue, MD (Inactive) as Consulting Physician (Oncology)  Indicate any recent Medical Services you may have received from other than [facility] providers in the past year (date may be approximate).     Assessment:   This is a routine wellness examination for Nathalya.  Hearing/Vision screen Hearing Screening - Comments:: No  issues Vision Screening - Comments:: readers  Dietary issues and exercise activities discussed:     Goals Addressed  This Visit's Progress    Patient Stated       None       Depression Screen    06/04/2023    9:17 AM 02/20/2023    1:03 PM 03/06/2022    2:18 PM 02/09/2021   10:58 AM 05/31/2020    8:20 AM 05/21/2020   11:41 AM 04/10/2019   10:42 AM  PHQ 2/9 Scores  PHQ - 2 Score 0 0 0 0 0 0 0  PHQ- 9 Score 0 3 0  0 0 0    Fall Risk    06/04/2023    9:10 AM 02/20/2023    1:03 PM 05/31/2020    8:19 AM 05/21/2020   11:40 AM 04/10/2019   10:42 AM  Fall Risk   Falls in the past year? 1 0 0 0 0  Number falls in past yr: 0 0 0    Injury with Fall? 1 0 0    Comment injured ankle      Risk for fall due to : History of fall(s);Impaired balance/gait No Fall Risks Medication side effect    Risk for fall due to: Comment hands full of items      Follow up Falls evaluation completed;Education provided;Falls prevention discussed Falls evaluation completed Falls evaluation completed;Falls prevention discussed      MEDICARE RISK AT HOME:  Medicare Risk at Home - 06/04/23 0912     Any stairs in or around the home? Yes    If so, are there any without handrails? No    Home free of loose throw rugs in walkways, pet beds, electrical cords, etc? Yes    Adequate lighting in your home to reduce risk of falls? Yes    Life alert? No    Use of a cane, walker or w/c? No    Grab bars in the bathroom? Yes    Shower chair or bench in shower? Yes    Elevated toilet seat or a handicapped toilet? No              Cognitive Function:    05/31/2020    8:22 AM 04/10/2019   10:42 AM 04/08/2018   10:28 AM  MMSE - Mini Mental State Exam  Orientation to time 5 5 5   Orientation to Place 5 5 5   Registration 3 3 3   Attention/ Calculation 5 0 0  Recall 3 3 2   Recall-comments   unable to recall 1 of 3 words  Language- name 2 objects  0 0  Language- repeat 1 1 1   Language- follow 3 step  command  0 3  Language- read & follow direction  0 0  Write a sentence  0 0  Copy design  0 0  Total score  17 19      02/09/2021   10:59 AM  Montreal Cognitive Assessment   Visuospatial/ Executive (0/5) 5  Naming (0/3) 3  Attention: Read list of digits (0/2) 2  Attention: Read list of letters (0/1) 1  Attention: Serial 7 subtraction starting at 100 (0/3) 3  Language: Repeat phrase (0/2) 2  Language : Fluency (0/1) 0  Abstraction (0/2) 2  Delayed Recall (0/5) 0  Orientation (0/6) 5  Total 23      06/04/2023    9:22 AM  6CIT Screen  What Year? 0 points  What month? 3 points  What time? 0 points  Count back from 20 0 points  Months in reverse 0 points  Repeat phrase 10 points  Total Score 13 points    Immunizations Immunization History  Administered Date(s) Administered   Fluad Quad(high Dose 65+) 08/18/2020   Influenza, High Dose Seasonal PF 08/16/2017, 07/26/2018   Influenza, Quadrivalent, Recombinant, Inj, Pf 07/25/2019, 09/09/2019   Influenza-Unspecified 08/05/2014, 08/06/2016, 08/15/2017   PFIZER(Purple Top)SARS-COV-2 Vaccination 12/09/2019, 01/01/2020, 08/09/2020   Pneumococcal Conjugate-13 04/17/2018   Pneumococcal Polysaccharide-23 02/05/2008, 05/16/2019   Td 11/06/2006   Tdap 06/15/2017    TDAP status: Up to date  Flu  Vaccine Due, patient thinks that she had one in 2023 but not sure where  Pneumococcal vaccine status: Up to date  Covid-19 vaccine status: Information provided on how to obtain vaccines.   Qualifies for Shingles Vaccine? Yes   Zostavax completed No   Shingrix Completed?: No.    Education has been provided regarding the importance of this vaccine. Patient has been advised to call insurance company to determine out of pocket expense if they have not yet received this vaccine. Advised may also receive vaccine at local pharmacy or Health Dept. Verbalized acceptance and understanding.  Screening Tests Health Maintenance  Topic Date Due    Zoster Vaccines- Shingrix (1 of 2) Never done   Medicare Annual Wellness (AWV)  05/31/2021   FOOT EXAM  02/09/2022   OPHTHALMOLOGY EXAM  06/14/2022   COVID-19 Vaccine (4 - 2023-24 season) 07/07/2022   Diabetic kidney evaluation - Urine ACR  03/07/2023   INFLUENZA VACCINE  06/07/2023   HEMOGLOBIN A1C  11/23/2023   Diabetic kidney evaluation - eGFR measurement  02/20/2024   DTaP/Tdap/Td (3 - Td or Tdap) 06/16/2027   Colonoscopy  05/14/2028   Pneumonia Vaccine 77+ Years old  Completed   DEXA SCAN  Completed   Hepatitis C Screening  Completed   HPV VACCINES  Aged Out    Health Maintenance  Health Maintenance Due  Topic Date Due   Zoster Vaccines- Shingrix (1 of 2) Never done   Medicare Annual Wellness (AWV)  05/31/2021   FOOT EXAM  02/09/2022   OPHTHALMOLOGY EXAM  06/14/2022   COVID-19 Vaccine (4 - 2023-24 season) 07/07/2022   Diabetic kidney evaluation - Urine ACR  03/07/2023    Colonoscopy completed 7/19. Repeat in 5-10 years  Mammogram status: Completed 7/24. Repeat every year  Bone Density status: Completed 6/23. Results reflect: Bone density results: NORMAL. Repeat every 2 years.  Lung Cancer Screening: (Low Dose CT Chest recommended if Age 34-80 years, 20 pack-year currently smoking OR have quit w/in 15years.) does not qualify.     Additional Screening:  Hepatitis C Screening: does qualify; Completed 6/19  Vision Screening: Recommended annual ophthalmology exams for early detection of glaucoma and other disorders of the eye. Is the patient up to date with their annual eye exam?  Yes , per patient she thinks that she is Who is the provider or what is the name of the office in which the patient attends annual eye exams? , [facility] is the last place that she thinks that she went If pt is not established with a provider, would they like to be referred to a provider to establish care? No .   Dental Screening: Recommended annual dental exams for proper oral  hygiene  Diabetic Foot Exam: Diabetic Foot Exam: Overdue, Pt has been advised about the importance in completing this exam. Pt is scheduled for diabetic foot exam on due at next visit.  Community Resource Referral / Chronic Care Management: CRR required this visit?  No   CCM required this visit?  No     Plan:     I have personally reviewed and noted the following in the patient's chart:   Medical and social history Use of alcohol, tobacco or illicit drugs  Current medications and supplements including opioid prescriptions. Patient is not currently taking opioid prescriptions. Functional ability and status Nutritional status Physical activity Advanced directives List of other physicians Hospitalizations, surgeries, and ER visits in previous 12 months Vitals Screenings to include cognitive, depression, and falls Referrals and appointments  In addition, I have reviewed and discussed with patient certain preventive protocols, quality metrics, and best practice recommendations. A written personalized care plan for preventive services as well as general preventive health recommendations were provided to patient.     Sydell Axon, LPN   [id number]/1191   After Visit Summary: (Declined) Due to this being a telephonic visit, with patients personalized plan was offered to patient but patient Declined AVS at this time   Nurse Notes: None. Patient stated that she was in a hurry and had to finish this call because she has another appointment to get to.

## 2023-07-02 ENCOUNTER — Encounter: Payer: Self-pay | Admitting: Nurse Practitioner

## 2023-07-02 ENCOUNTER — Other Ambulatory Visit: Payer: Self-pay | Admitting: Internal Medicine

## 2023-07-03 ENCOUNTER — Telehealth: Payer: Self-pay

## 2023-07-03 NOTE — Telephone Encounter (Signed)
Patient's daughter, [name], called to state that patient needs to have an appointment with [name], NP.  I was unable to schedule appointment with Maxie Better because patient does not have anyone listed on her DPR as being able to share health information with them.  I let Lila know that I will send message to [name], NP.  Maxie Better states she is on patient's health care of attorney but we do not have a copy of this document on file.  Maxie Better states she is at work right now, but as soon as she can, she will get this to [place].  Maxie Better states patient has dementia.  Maxie Better states patient is extremely lethargic right now and out of sorts.  Maxie Better states she is concerned that patient may have a UTI.  Maxie Better states patient has started having incontinence.  Maxie Better states she would like for [place] to please call patient's home number to try to schedule an appointment with her.  I called patient's home number and spoke with husband, Espyn Dyes, who tried to get patient on phone.  Jillyn Hidden states patient is being stubborn.  Patient did eventually come to the phone and she allowed me to schedule an appointment for her to see Dr. Duncan Dull tomorrow.

## 2023-07-04 ENCOUNTER — Ambulatory Visit (INDEPENDENT_AMBULATORY_CARE_PROVIDER_SITE_OTHER): Payer: Medicare Other | Admitting: Internal Medicine

## 2023-07-04 ENCOUNTER — Encounter: Payer: Self-pay | Admitting: Internal Medicine

## 2023-07-04 VITALS — BP 132/82 | HR 82 | Temp 98.0°F | Ht 66.0 in | Wt 171.8 lb

## 2023-07-04 DIAGNOSIS — N3090 Cystitis, unspecified without hematuria: Secondary | ICD-10-CM | POA: Diagnosis not present

## 2023-07-04 DIAGNOSIS — R300 Dysuria: Secondary | ICD-10-CM | POA: Diagnosis not present

## 2023-07-04 LAB — URINALYSIS, MICROSCOPIC ONLY: RBC / HPF: NONE SEEN (ref 0–?)

## 2023-07-04 LAB — POCT URINALYSIS DIPSTICK
Bilirubin, UA: NEGATIVE
Glucose, UA: NEGATIVE
Ketones, UA: NEGATIVE
Nitrite, UA: POSITIVE
Protein, UA: POSITIVE — AB
Spec Grav, UA: 1.015 (ref 1.010–1.025)
Urobilinogen, UA: 0.2 E.U./dL
pH, UA: 6 (ref 5.0–8.0)

## 2023-07-04 MED ORDER — CIPROFLOXACIN HCL 250 MG PO TABS: 250.0000 mg | ORAL_TABLET | Freq: Two times a day (BID) | ORAL | 0 refills | Status: AC

## 2023-07-04 NOTE — Progress Notes (Addendum)
Subjective:  Patient ID: Gabriela [name], female    DOB: 1946/10/23  Age: 77 y.o. MRN: [id number]  CC: The primary encounter diagnosis was Dysuria. A diagnosis of Cystitis was also pertinent to this visit.   HPI Gabriela [name] presents for  Chief Complaint  Patient presents with   Dysuria   77 yr old female with history of BRCA,  Alzheimers Dementia and type 2 DM presents with urinary frequency/urgency without dysuria,, and lethargy .  Symptoms have been present for  for several weeks. Denies dysuria.  Flank pain, nausea and fevers.  No gross hematuria    Outpatient Medications Prior to Visit  Medication Sig Dispense Refill   cetirizine (ZYRTEC) 10 MG tablet Take 10 mg by mouth.     Cholecalciferol (VITAMIN D) 2000 units tablet Take by mouth.     cyclobenzaprine (FLEXERIL) 5 MG tablet Take ONE tab PO BID/TID PRN     donepezil (ARICEPT) 5 MG tablet Take 5 mg by mouth every morning.     gabapentin (NEURONTIN) 300 MG capsule Take 1 capsule every day by oral route at bedtime.     Lancets (ONETOUCH DELICA PLUS LANCET33G) MISC USE TO CHECK BLOOD SUGAR  DAILY 100 each 3   meloxicam (MOBIC) 15 MG tablet Take 15 mg by mouth daily.     metFORMIN (GLUCOPHAGE-XR) 500 MG 24 hr tablet Take 1 tablet (500 mg total) by mouth 2 (two) times daily. 180 tablet 3   Multiple Vitamins-Minerals (MULTIVITAMIN WOMEN 50+ PO) Take 1 tablet by mouth daily.     ONETOUCH ULTRA test strip USE ONCE DAILY 100 strip 3   pravastatin (PRAVACHOL) 20 MG tablet Take 1 tablet (20 mg total) by mouth daily. 90 tablet 3   QUEtiapine (SEROQUEL) 25 MG tablet Take 25 mg by mouth at bedtime.     tamoxifen (NOLVADEX) 20 MG tablet TAKE 1 TABLET BY MOUTH DAILY 100 tablet 2   meloxicam (MOBIC) 7.5 MG tablet Take 1 tablet every day by oral route. (Patient not taking: Reported on 07/04/2023)     No facility-administered medications prior to visit.    Review of Systems;  Patient denies headache, fevers, malaise, unintentional weight  loss, skin rash, eye pain, sinus congestion and sinus pain, sore throat, dysphagia,  hemoptysis , cough, dyspnea, wheezing, chest pain, palpitations, orthopnea, edema, abdominal pain, nausea, melena, diarrhea, constipation, flank pain, dysuria, hematuria, urinary  Frequency, nocturia, numbness, tingling, seizures,  Focal weakness, Loss of consciousness,  Tremor, insomnia, depression, anxiety, and suicidal ideation.      Objective:  BP 132/82   Pulse 82   Temp 98 F (36.7 C) (Oral)   Ht 5\' 6"  (1.676 m)   Wt 171 lb 12.8 oz (77.9 kg)   SpO2 96%   BMI 27.73 kg/m   BP Readings from Last 3 Encounters:  07/04/23 132/82  02/27/23 133/71  02/20/23 120/70    Wt Readings from Last 3 Encounters:  07/04/23 171 lb 12.8 oz (77.9 kg)  06/04/23 178 lb (80.7 kg)  02/27/23 178 lb (80.7 kg)    Physical Exam Vitals reviewed.  Constitutional:      General: She is not in acute distress.    Appearance: Normal appearance. She is normal weight. She is not ill-appearing, toxic-appearing or diaphoretic.  HENT:     Head: Normocephalic.  Eyes:     General: No scleral icterus.       Right eye: No discharge.        Left eye: No discharge.  Conjunctiva/sclera: Conjunctivae normal.  Musculoskeletal:        General: Normal range of motion.  Skin:    General: Skin is warm and dry.  Neurological:     General: No focal deficit present.     Mental Status: She is alert and oriented to person, place, and time. Mental status is at baseline.  Psychiatric:        Mood and Affect: Mood normal.        Behavior: Behavior normal.        Thought Content: Thought content normal.        Judgment: Judgment normal.    Lab Results  Component Value Date   HGBA1C 7.0 (H) 05/23/2023   HGBA1C 7.5 (H) 02/20/2023   HGBA1C 6.9 (H) 03/06/2022    Lab Results  Component Value Date   CREATININE 0.79 02/20/2023   CREATININE 0.85 03/06/2022   CREATININE 0.85 05/17/2021    Lab Results  Component Value Date   WBC  7.4 02/20/2023   HGB 13.2 02/20/2023   HCT 39.4 02/20/2023   PLT 225.0 02/20/2023   GLUCOSE 122 (H) 02/20/2023   CHOL 172 02/20/2023   TRIG 67.0 02/20/2023   HDL 61.50 02/20/2023   LDLCALC 97 02/20/2023   ALT 14 02/20/2023   AST 15 02/20/2023   NA 138 02/20/2023   K 4.2 02/20/2023   CL 100 02/20/2023   CREATININE 0.79 02/20/2023   BUN 21 02/20/2023   CO2 31 02/20/2023   TSH 1.48 02/20/2023   INR 0.95 06/15/2017   HGBA1C 7.0 (H) 05/23/2023   MICROALBUR <0.7 03/06/2022    MM 3D SCREENING MAMMOGRAM BILATERAL BREAST  Result Date: 05/09/2023 CLINICAL DATA:  Screening. EXAM: DIGITAL SCREENING BILATERAL MAMMOGRAM WITH TOMOSYNTHESIS TECHNIQUE: Bilateral screening digital craniocaudal and mediolateral oblique mammograms were obtained. Bilateral screening digital breast tomosynthesis was performed. COMPARISON:  Previous exam(s). ACR Breast Density Category b: There are scattered areas of fibroglandular density. FINDINGS: There are no findings suspicious for malignancy. IMPRESSION: No mammographic evidence of malignancy. A result letter of this screening mammogram will be mailed directly to the patient. RECOMMENDATION: Screening mammogram in one year. (Code:SM-B-01Y) BI-RADS CATEGORY  1: Negative. Electronically Signed   By: Elberta Fortis M.D.   On: 05/09/2023 11:00    Assessment & Plan:  .Dysuria -     POCT urinalysis dipstick -     Urine Culture -     Urine Microscopic  Cystitis Assessment & Plan: Suggested by symptomss of frequency/urgency and POC UA noting blood,  nitrates and leuk's.  Empiric cipro prescribed for 3 days,  probiotic advised.  Culture sent    Other orders -     Ciprofloxacin HCl; Take 1 tablet (250 mg total) by mouth 2 (two) times daily for 3 days.  Dispense: 6 tablet; Refill: 0   Follow-up: No follow-ups on file.   Sherlene Shams, MD

## 2023-07-04 NOTE — Patient Instructions (Signed)
I am treating  you for a  Urinary tract infection  With an antibiotic called "ciprofloxacin. " please take it twice daily WITH FOOD for 3 days  Taking an antibiotic can create an imbalance in the normal population of bacteria that live in the small intestine.  This imbalance can persist for 3 months.   Taking a probiotic ( Align, Floraque or Culturelle), the generic version of one of these over the counter medications, or an alternative form (kombucha,  Yogurt, or another dietary source) for a minimum of 3 weeks may help prevent a serious antibiotic associated diarrhea  Called clostridium dificile colitis that occurs when the bacteria population is altered .  Taking a probiotic may also prevent vaginitis due to yeast infections and can be continued indefinitely if you feel that it improves your digestion or your elimination (bowels).

## 2023-07-04 NOTE — Assessment & Plan Note (Addendum)
Suggested by symptomss of frequency/urgency and POC UA noting blood,  nitrates and leuk's.  Empiric cipro prescribed for 3 days,  probiotic advised.  Culture sent

## 2023-07-06 LAB — URINE CULTURE
MICRO NUMBER:: 15393883
SPECIMEN QUALITY:: ADEQUATE

## 2023-07-09 ENCOUNTER — Encounter: Payer: Self-pay | Admitting: Nurse Practitioner

## 2023-07-11 DIAGNOSIS — M7071 Other bursitis of hip, right hip: Secondary | ICD-10-CM | POA: Diagnosis not present

## 2023-07-26 DIAGNOSIS — M25551 Pain in right hip: Secondary | ICD-10-CM | POA: Diagnosis not present

## 2023-07-26 DIAGNOSIS — M25651 Stiffness of right hip, not elsewhere classified: Secondary | ICD-10-CM | POA: Diagnosis not present

## 2023-07-30 ENCOUNTER — Encounter: Payer: Self-pay | Admitting: Nurse Practitioner

## 2023-07-31 DIAGNOSIS — M25551 Pain in right hip: Secondary | ICD-10-CM | POA: Diagnosis not present

## 2023-07-31 DIAGNOSIS — M25651 Stiffness of right hip, not elsewhere classified: Secondary | ICD-10-CM | POA: Diagnosis not present

## 2023-08-06 DIAGNOSIS — M25651 Stiffness of right hip, not elsewhere classified: Secondary | ICD-10-CM | POA: Diagnosis not present

## 2023-08-06 DIAGNOSIS — M25551 Pain in right hip: Secondary | ICD-10-CM | POA: Diagnosis not present

## 2023-08-22 DIAGNOSIS — M25651 Stiffness of right hip, not elsewhere classified: Secondary | ICD-10-CM | POA: Diagnosis not present

## 2023-08-22 DIAGNOSIS — M25551 Pain in right hip: Secondary | ICD-10-CM | POA: Diagnosis not present

## 2023-08-23 ENCOUNTER — Ambulatory Visit (INDEPENDENT_AMBULATORY_CARE_PROVIDER_SITE_OTHER): Payer: Medicare Other | Admitting: Nurse Practitioner

## 2023-08-23 ENCOUNTER — Encounter: Payer: Self-pay | Admitting: Nurse Practitioner

## 2023-08-23 VITALS — BP 110/72 | HR 70 | Temp 97.7°F | Ht 66.0 in | Wt 168.4 lb

## 2023-08-23 DIAGNOSIS — E785 Hyperlipidemia, unspecified: Secondary | ICD-10-CM

## 2023-08-23 DIAGNOSIS — F0280 Dementia in other diseases classified elsewhere without behavioral disturbance: Secondary | ICD-10-CM | POA: Diagnosis not present

## 2023-08-23 DIAGNOSIS — E119 Type 2 diabetes mellitus without complications: Secondary | ICD-10-CM | POA: Diagnosis not present

## 2023-08-23 DIAGNOSIS — R32 Unspecified urinary incontinence: Secondary | ICD-10-CM | POA: Diagnosis not present

## 2023-08-23 DIAGNOSIS — E1169 Type 2 diabetes mellitus with other specified complication: Secondary | ICD-10-CM | POA: Diagnosis not present

## 2023-08-23 DIAGNOSIS — Z1329 Encounter for screening for other suspected endocrine disorder: Secondary | ICD-10-CM | POA: Diagnosis not present

## 2023-08-23 DIAGNOSIS — G301 Alzheimer's disease with late onset: Secondary | ICD-10-CM | POA: Diagnosis not present

## 2023-08-23 DIAGNOSIS — Z7984 Long term (current) use of oral hypoglycemic drugs: Secondary | ICD-10-CM | POA: Diagnosis not present

## 2023-08-23 LAB — POC URINALSYSI DIPSTICK (AUTOMATED)
Bilirubin, UA: NEGATIVE
Glucose, UA: NEGATIVE
Nitrite, UA: NEGATIVE
Protein, UA: NEGATIVE
Spec Grav, UA: 1.025 (ref 1.010–1.025)
Urobilinogen, UA: 0.2 U/dL
pH, UA: 5.5 (ref 5.0–8.0)

## 2023-08-23 NOTE — Progress Notes (Unsigned)
Gabriela Dicker, NP-C Phone: [phone]  Gabriela [name] is a 77 y.o. female who presents today for follow up. Patient present by herself today. She has no complaints or new concerns today.   Patient's daughter who is also her caregiver sent MyChart message concerned regarding recent urinary incontinence at night. She is requesting a urinalysis to check for UTI today at appointment as she is not able to be with her. Patient denies any symptoms today. Denies problems with incontinence.  UTI:  Dysuria- No  Frequency- No   Urgency- No   Hematuria- No   Fever- No  Abd pain- No   Vaginal d/c- No  DIABETES Disease Monitoring: Blood Sugar ranges- Not checking Polyuria/phagia/dipsia- No      Optho- Yes Medications: Compliance- Metformin Hypoglycemic symptoms- No Lab Results  Component Value Date   HGBA1C 6.7 (H) 08/23/2023   HYPERLIPIDEMIA Symptoms Chest pain on exertion:  No   Leg claudication:   No Medications: Compliance- Pravastatin Right upper quadrant pain- No  Muscle aches- No Lipid Panel     Component Value Date/Time   CHOL 150 08/23/2023 1523   TRIG 111.0 08/23/2023 1523   HDL 56.50 08/23/2023 1523   CHOLHDL 3 08/23/2023 1523   VLDL 22.2 08/23/2023 1523   LDLCALC 72 08/23/2023 1523    Social History   Tobacco Use  Smoking Status Never  Smokeless Tobacco Never    Current Outpatient Medications on File Prior to Visit  Medication Sig Dispense Refill   cetirizine (ZYRTEC) 10 MG tablet Take 10 mg by mouth.     Cholecalciferol (VITAMIN D) 2000 units tablet Take by mouth.     cyclobenzaprine (FLEXERIL) 5 MG tablet Take ONE tab PO BID/TID PRN (Patient not taking: Reported on 08/23/2023)     donepezil (ARICEPT) 5 MG tablet Take 5 mg by mouth every morning.     gabapentin (NEURONTIN) 300 MG capsule Take 1 capsule every day by oral route at bedtime.     Lancets (ONETOUCH DELICA PLUS LANCET33G) MISC USE TO CHECK BLOOD SUGAR  DAILY 100 each 3   meloxicam (MOBIC) 15 MG  tablet Take 15 mg by mouth daily. (Patient not taking: Reported on 08/23/2023)     metFORMIN (GLUCOPHAGE-XR) 500 MG 24 hr tablet Take 1 tablet (500 mg total) by mouth 2 (two) times daily. 180 tablet 3   Multiple Vitamins-Minerals (MULTIVITAMIN WOMEN 50+ PO) Take 1 tablet by mouth daily.     ONETOUCH ULTRA test strip USE ONCE DAILY 100 strip 3   pravastatin (PRAVACHOL) 20 MG tablet Take 1 tablet (20 mg total) by mouth daily. 90 tablet 3   QUEtiapine (SEROQUEL) 25 MG tablet Take 25 mg by mouth at bedtime. (Patient not taking: Reported on 08/23/2023)     tamoxifen (NOLVADEX) 20 MG tablet TAKE 1 TABLET BY MOUTH DAILY (Patient not taking: Reported on 08/23/2023) 100 tablet 2   No current facility-administered medications on file prior to visit.    ROS see history of present illness  Objective  Physical Exam Vitals:   08/23/23 1508  BP: 110/72  Pulse: 70  Temp: 97.7 F (36.5 C)  SpO2: 91%    BP Readings from Last 3 Encounters:  08/23/23 110/72  07/04/23 132/82  02/27/23 133/71   Wt Readings from Last 3 Encounters:  08/23/23 168 lb 6.4 oz (76.4 kg)  07/04/23 171 lb 12.8 oz (77.9 kg)  06/04/23 178 lb (80.7 kg)    Physical Exam Constitutional:      General: She is not  in acute distress.    Appearance: Normal appearance.  HENT:     Head: Normocephalic.  Cardiovascular:     Rate and Rhythm: Normal rate and regular rhythm.     Heart sounds: Normal heart sounds.  Pulmonary:     Effort: Pulmonary effort is normal.     Breath sounds: Normal breath sounds.  Abdominal:     General: Abdomen is flat. Bowel sounds are normal.     Palpations: Abdomen is soft.     Tenderness: There is no abdominal tenderness.  Skin:    General: Skin is warm and dry.  Neurological:     General: No focal deficit present.     Mental Status: She is alert.  Psychiatric:        Mood and Affect: Mood normal.        Behavior: Behavior normal.    Assessment/Plan: Please see individual problem  list.  Urinary incontinence, unspecified type Assessment & Plan: UA in office with trace blood and small leukocytes. Microscopic and culture pending. Will contact daughter with results. Patient denies any symptoms. Concern that recent incontinence is part of Alzheimer's progression. Further work up pending urine results.   Orders: -     POCT Urinalysis Dipstick (Automated) -     Urine Culture -     Urinalysis, Routine w reflex microscopic  Alzheimer's disease with late onset [facility]) Assessment & Plan: Managed by [facility]. Taking Aricept 5 mg QHS. Follow up next week as scheduled.   Orders: -     CBC with Differential/Platelet  Diabetes mellitus type II, non insulin dependent (HCC) Assessment & Plan: Chronic. Stable on Metformin 500 mg BID. Continue. Will check A1c today. Encouraged healthy diet and exercise as tolerated.   Orders: -     Comprehensive metabolic panel -     Hemoglobin A1c  Hyperlipidemia associated with type 2 diabetes mellitus (HCC) Assessment & Plan: Chronic. Stable on Pravastatin 20 mg daily. Continue. Will check lipid panel today.  Orders: -     Lipid panel  Thyroid disorder screen -     TSH   Return in about 6 months (around 02/21/2024) for Follow up.   Gabriela Dicker, NP-C [facility] - [employer]

## 2023-08-24 ENCOUNTER — Other Ambulatory Visit: Payer: Self-pay | Admitting: Nurse Practitioner

## 2023-08-24 DIAGNOSIS — E875 Hyperkalemia: Secondary | ICD-10-CM

## 2023-08-24 LAB — CBC WITH DIFFERENTIAL/PLATELET
Basophils Absolute: 0 10*3/uL (ref 0.0–0.1)
Basophils Relative: 0.7 % (ref 0.0–3.0)
Eosinophils Absolute: 0.1 10*3/uL (ref 0.0–0.7)
Eosinophils Relative: 2.1 % (ref 0.0–5.0)
HCT: 39.4 % (ref 36.0–46.0)
Hemoglobin: 12.7 g/dL (ref 12.0–15.0)
Lymphocytes Relative: 29.6 % (ref 12.0–46.0)
Lymphs Abs: 1.6 10*3/uL (ref 0.7–4.0)
MCHC: 32.1 g/dL (ref 30.0–36.0)
MCV: 89.1 fL (ref 78.0–100.0)
Monocytes Absolute: 0.7 10*3/uL (ref 0.1–1.0)
Monocytes Relative: 13.3 % — ABNORMAL HIGH (ref 3.0–12.0)
Neutro Abs: 3 10*3/uL (ref 1.4–7.7)
Neutrophils Relative %: 54.3 % (ref 43.0–77.0)
Platelets: 284 10*3/uL (ref 150.0–400.0)
RBC: 4.42 Mil/uL (ref 3.87–5.11)
RDW: 14.5 % (ref 11.5–15.5)
WBC: 5.5 10*3/uL (ref 4.0–10.5)

## 2023-08-24 LAB — TSH: TSH: 1.46 u[IU]/mL (ref 0.35–5.50)

## 2023-08-24 LAB — URINALYSIS, ROUTINE W REFLEX MICROSCOPIC
Bilirubin Urine: NEGATIVE
Ketones, ur: NEGATIVE
Nitrite: NEGATIVE
Specific Gravity, Urine: 1.025 (ref 1.000–1.030)
Total Protein, Urine: NEGATIVE
Urine Glucose: NEGATIVE
Urobilinogen, UA: 0.2 (ref 0.0–1.0)
pH: 6 (ref 5.0–8.0)

## 2023-08-24 LAB — URINE CULTURE
MICRO NUMBER:: 15608282
SPECIMEN QUALITY:: ADEQUATE

## 2023-08-24 LAB — LIPID PANEL
Cholesterol: 150 mg/dL (ref 0–200)
HDL: 56.5 mg/dL (ref >39.00)
LDL Cholesterol: 72 mg/dL (ref 0–99)
NonHDL: 93.82
Total CHOL/HDL Ratio: 3
Triglycerides: 111 mg/dL (ref 0.0–149.0)
VLDL: 22.2 mg/dL (ref 0.0–40.0)

## 2023-08-24 LAB — COMPREHENSIVE METABOLIC PANEL
ALT: 20 U/L (ref 0–35)
AST: 25 U/L (ref 0–37)
Albumin: 4.4 g/dL (ref 3.5–5.2)
Alkaline Phosphatase: 29 U/L — ABNORMAL LOW (ref 39–117)
BUN: 17 mg/dL (ref 6–23)
CO2: 29 meq/L (ref 19–32)
Calcium: 10.2 mg/dL (ref 8.4–10.5)
Chloride: 100 meq/L (ref 96–112)
Creatinine, Ser: 0.86 mg/dL (ref 0.40–1.20)
GFR: 65.89 mL/min (ref >60.00)
Glucose, Bld: 126 mg/dL — ABNORMAL HIGH (ref 70–99)
Potassium: 5.5 meq/L — ABNORMAL HIGH (ref 3.5–5.1)
Sodium: 141 meq/L (ref 135–145)
Total Bilirubin: 0.5 mg/dL (ref 0.2–1.2)
Total Protein: 6.8 g/dL (ref 6.0–8.3)

## 2023-08-24 LAB — HEMOGLOBIN A1C: Hgb A1c MFr Bld: 6.7 % — ABNORMAL HIGH (ref 4.6–6.5)

## 2023-08-27 ENCOUNTER — Other Ambulatory Visit (INDEPENDENT_AMBULATORY_CARE_PROVIDER_SITE_OTHER): Payer: Medicare Other

## 2023-08-27 DIAGNOSIS — E875 Hyperkalemia: Secondary | ICD-10-CM | POA: Diagnosis not present

## 2023-08-28 LAB — POTASSIUM: Potassium: 4.2 meq/L (ref 3.5–5.1)

## 2023-08-29 DIAGNOSIS — G301 Alzheimer's disease with late onset: Secondary | ICD-10-CM | POA: Diagnosis not present

## 2023-08-29 DIAGNOSIS — D329 Benign neoplasm of meninges, unspecified: Secondary | ICD-10-CM | POA: Diagnosis not present

## 2023-08-29 DIAGNOSIS — G44209 Tension-type headache, unspecified, not intractable: Secondary | ICD-10-CM | POA: Diagnosis not present

## 2023-09-03 DIAGNOSIS — M25551 Pain in right hip: Secondary | ICD-10-CM | POA: Diagnosis not present

## 2023-09-03 DIAGNOSIS — M25651 Stiffness of right hip, not elsewhere classified: Secondary | ICD-10-CM | POA: Diagnosis not present

## 2023-09-05 ENCOUNTER — Encounter: Payer: Self-pay | Admitting: Nurse Practitioner

## 2023-09-05 DIAGNOSIS — M5416 Radiculopathy, lumbar region: Secondary | ICD-10-CM | POA: Diagnosis not present

## 2023-09-05 DIAGNOSIS — R32 Unspecified urinary incontinence: Secondary | ICD-10-CM | POA: Insufficient documentation

## 2023-09-05 NOTE — Assessment & Plan Note (Addendum)
UA in office with trace blood and small leukocytes. Microscopic and culture pending. Will contact daughter with results. Patient denies any symptoms. Concern that recent incontinence is part of Alzheimer's progression. Further work up pending urine results.

## 2023-09-05 NOTE — Assessment & Plan Note (Signed)
Managed by [facility]. Taking Aricept 5 mg QHS. Follow up next week as scheduled.

## 2023-09-05 NOTE — Assessment & Plan Note (Signed)
Chronic. Stable on Pravastatin 20 mg daily. Continue. Will check lipid panel today.

## 2023-09-05 NOTE — Assessment & Plan Note (Signed)
Chronic. Stable on Metformin 500 mg BID. Continue. Will check A1c today. Encouraged healthy diet and exercise as tolerated.

## 2023-09-12 DIAGNOSIS — M5416 Radiculopathy, lumbar region: Secondary | ICD-10-CM | POA: Diagnosis not present

## 2023-09-19 ENCOUNTER — Encounter: Payer: Self-pay | Admitting: Nurse Practitioner

## 2023-09-19 DIAGNOSIS — M5416 Radiculopathy, lumbar region: Secondary | ICD-10-CM | POA: Diagnosis not present

## 2023-09-21 ENCOUNTER — Encounter: Payer: Self-pay | Admitting: Nurse Practitioner

## 2023-09-21 NOTE — Telephone Encounter (Signed)
 Care team updated and letter sent for eye exam notes.

## 2023-09-28 DIAGNOSIS — M7989 Other specified soft tissue disorders: Secondary | ICD-10-CM | POA: Diagnosis not present

## 2023-09-28 DIAGNOSIS — M79672 Pain in left foot: Secondary | ICD-10-CM | POA: Diagnosis not present

## 2023-09-28 DIAGNOSIS — M2142 Flat foot [pes planus] (acquired), left foot: Secondary | ICD-10-CM | POA: Diagnosis not present

## 2023-10-01 DIAGNOSIS — G44209 Tension-type headache, unspecified, not intractable: Secondary | ICD-10-CM | POA: Diagnosis not present

## 2023-10-02 ENCOUNTER — Telehealth: Payer: Self-pay | Admitting: Nurse Practitioner

## 2023-10-02 NOTE — Telephone Encounter (Signed)
MRI report has placed in the red folder to be scanned in to pts chart

## 2023-10-02 NOTE — Telephone Encounter (Signed)
Patient's daughter brought a copy of a form for provider to view. It's in the colorful folder. Her number is [phone] if you have any questions.

## 2023-10-24 DIAGNOSIS — R609 Edema, unspecified: Secondary | ICD-10-CM | POA: Diagnosis not present

## 2023-10-24 DIAGNOSIS — R030 Elevated blood-pressure reading, without diagnosis of hypertension: Secondary | ICD-10-CM | POA: Diagnosis not present

## 2023-10-24 DIAGNOSIS — D329 Benign neoplasm of meninges, unspecified: Secondary | ICD-10-CM | POA: Diagnosis not present

## 2023-10-24 DIAGNOSIS — G301 Alzheimer's disease with late onset: Secondary | ICD-10-CM | POA: Diagnosis not present

## 2023-10-24 DIAGNOSIS — G44209 Tension-type headache, unspecified, not intractable: Secondary | ICD-10-CM | POA: Diagnosis not present

## 2023-10-26 ENCOUNTER — Encounter: Payer: Self-pay | Admitting: Nurse Practitioner

## 2023-11-02 ENCOUNTER — Encounter: Payer: Self-pay | Admitting: Nurse Practitioner

## 2023-11-02 DIAGNOSIS — D329 Benign neoplasm of meninges, unspecified: Secondary | ICD-10-CM | POA: Diagnosis not present

## 2023-11-02 DIAGNOSIS — G44209 Tension-type headache, unspecified, not intractable: Secondary | ICD-10-CM | POA: Diagnosis not present

## 2023-11-02 DIAGNOSIS — Z87820 Personal history of traumatic brain injury: Secondary | ICD-10-CM | POA: Diagnosis not present

## 2023-11-02 DIAGNOSIS — Z604 Social exclusion and rejection: Secondary | ICD-10-CM | POA: Diagnosis not present

## 2023-11-02 DIAGNOSIS — Z9181 History of falling: Secondary | ICD-10-CM | POA: Diagnosis not present

## 2023-11-02 DIAGNOSIS — H538 Other visual disturbances: Secondary | ICD-10-CM | POA: Diagnosis not present

## 2023-11-02 DIAGNOSIS — E119 Type 2 diabetes mellitus without complications: Secondary | ICD-10-CM | POA: Diagnosis not present

## 2023-11-02 DIAGNOSIS — E785 Hyperlipidemia, unspecified: Secondary | ICD-10-CM | POA: Diagnosis not present

## 2023-11-02 DIAGNOSIS — M21062 Valgus deformity, not elsewhere classified, left knee: Secondary | ICD-10-CM | POA: Diagnosis not present

## 2023-11-02 DIAGNOSIS — G4700 Insomnia, unspecified: Secondary | ICD-10-CM | POA: Diagnosis not present

## 2023-11-02 DIAGNOSIS — G301 Alzheimer's disease with late onset: Secondary | ICD-10-CM | POA: Diagnosis not present

## 2023-11-02 DIAGNOSIS — E538 Deficiency of other specified B group vitamins: Secondary | ICD-10-CM | POA: Diagnosis not present

## 2023-11-02 DIAGNOSIS — Z7984 Long term (current) use of oral hypoglycemic drugs: Secondary | ICD-10-CM | POA: Diagnosis not present

## 2023-11-02 DIAGNOSIS — Z853 Personal history of malignant neoplasm of breast: Secondary | ICD-10-CM | POA: Diagnosis not present

## 2023-11-05 ENCOUNTER — Other Ambulatory Visit: Payer: Self-pay | Admitting: Nurse Practitioner

## 2023-11-05 DIAGNOSIS — M7989 Other specified soft tissue disorders: Secondary | ICD-10-CM

## 2023-11-08 ENCOUNTER — Ambulatory Visit
Admission: RE | Admit: 2023-11-08 | Discharge: 2023-11-08 | Disposition: A | Discharge status: Home / Self Care | Payer: Medicare Other | Source: Ambulatory Visit | Attending: Nurse Practitioner | Admitting: Nurse Practitioner

## 2023-11-08 DIAGNOSIS — M79605 Pain in left leg: Secondary | ICD-10-CM | POA: Diagnosis not present

## 2023-11-08 DIAGNOSIS — M7989 Other specified soft tissue disorders: Secondary | ICD-10-CM | POA: Insufficient documentation

## 2023-11-09 ENCOUNTER — Ambulatory Visit: Payer: Medicare Other | Admitting: Nurse Practitioner

## 2023-11-09 NOTE — Progress Notes (Deleted)
  Leron Glance, NP-C Phone: [phone]  Gabriela [name] is a 78 y.o. female who presents today for ***  ***  Social History   Tobacco Use  Smoking Status Never  Smokeless Tobacco Never    Current Outpatient Medications on File Prior to Visit  Medication Sig Dispense Refill   cetirizine (ZYRTEC) 10 MG tablet Take 10 mg by mouth.     Cholecalciferol (VITAMIN D ) 2000 units tablet Take by mouth.     cyclobenzaprine (FLEXERIL) 5 MG tablet Take ONE tab PO BID/TID PRN (Patient not taking: Reported on 08/23/2023)     donepezil (ARICEPT) 5 MG tablet Take 5 mg by mouth every morning.     gabapentin  (NEURONTIN ) 300 MG capsule Take 1 capsule every day by oral route at bedtime.     Lancets (ONETOUCH DELICA PLUS LANCET33G) MISC USE TO CHECK BLOOD SUGAR  DAILY 100 each 3   meloxicam  (MOBIC ) 15 MG tablet Take 15 mg by mouth daily. (Patient not taking: Reported on 08/23/2023)     metFORMIN  (GLUCOPHAGE -XR) 500 MG 24 hr tablet Take 1 tablet (500 mg total) by mouth 2 (two) times daily. 180 tablet 3   Multiple Vitamins-Minerals (MULTIVITAMIN WOMEN 50+ PO) Take 1 tablet by mouth daily.     ONETOUCH ULTRA test strip USE ONCE DAILY 100 strip 3   pravastatin  (PRAVACHOL ) 20 MG tablet Take 1 tablet (20 mg total) by mouth daily. 90 tablet 3   QUEtiapine (SEROQUEL) 25 MG tablet Take 25 mg by mouth at bedtime. (Patient not taking: Reported on 08/23/2023)     tamoxifen  (NOLVADEX ) 20 MG tablet TAKE 1 TABLET BY MOUTH DAILY (Patient not taking: Reported on 08/23/2023) 100 tablet 2   No current facility-administered medications on file prior to visit.     ROS see history of present illness  Objective  Physical Exam There were no vitals filed for this visit.  BP Readings from Last 3 Encounters:  08/23/23 110/72  07/04/23 132/82  02/27/23 133/71   Wt Readings from Last 3 Encounters:  08/23/23 168 lb 6.4 oz (76.4 kg)  07/04/23 171 lb 12.8 oz (77.9 kg)  06/04/23 178 lb (80.7 kg)    Physical  Exam   Assessment/Plan: Please see individual problem list.  There are no diagnoses linked to this encounter.   Health Maintenance: ***  No follow-ups on file.   Leron Glance, NP-C [facility] - [facility]

## 2023-11-13 DIAGNOSIS — E538 Deficiency of other specified B group vitamins: Secondary | ICD-10-CM | POA: Diagnosis not present

## 2023-11-13 DIAGNOSIS — H538 Other visual disturbances: Secondary | ICD-10-CM | POA: Diagnosis not present

## 2023-11-13 DIAGNOSIS — G301 Alzheimer's disease with late onset: Secondary | ICD-10-CM | POA: Diagnosis not present

## 2023-11-13 DIAGNOSIS — Z604 Social exclusion and rejection: Secondary | ICD-10-CM | POA: Diagnosis not present

## 2023-11-13 DIAGNOSIS — Z853 Personal history of malignant neoplasm of breast: Secondary | ICD-10-CM | POA: Diagnosis not present

## 2023-11-13 DIAGNOSIS — E119 Type 2 diabetes mellitus without complications: Secondary | ICD-10-CM | POA: Diagnosis not present

## 2023-11-13 DIAGNOSIS — Z87820 Personal history of traumatic brain injury: Secondary | ICD-10-CM | POA: Diagnosis not present

## 2023-11-13 DIAGNOSIS — E785 Hyperlipidemia, unspecified: Secondary | ICD-10-CM | POA: Diagnosis not present

## 2023-11-13 DIAGNOSIS — Z9181 History of falling: Secondary | ICD-10-CM | POA: Diagnosis not present

## 2023-11-13 DIAGNOSIS — G4700 Insomnia, unspecified: Secondary | ICD-10-CM | POA: Diagnosis not present

## 2023-11-13 DIAGNOSIS — D329 Benign neoplasm of meninges, unspecified: Secondary | ICD-10-CM | POA: Diagnosis not present

## 2023-11-13 DIAGNOSIS — G44209 Tension-type headache, unspecified, not intractable: Secondary | ICD-10-CM | POA: Diagnosis not present

## 2023-11-13 DIAGNOSIS — Z7984 Long term (current) use of oral hypoglycemic drugs: Secondary | ICD-10-CM | POA: Diagnosis not present

## 2023-11-13 DIAGNOSIS — M21062 Valgus deformity, not elsewhere classified, left knee: Secondary | ICD-10-CM | POA: Diagnosis not present

## 2023-11-15 DIAGNOSIS — G44209 Tension-type headache, unspecified, not intractable: Secondary | ICD-10-CM | POA: Diagnosis not present

## 2023-11-15 DIAGNOSIS — E538 Deficiency of other specified B group vitamins: Secondary | ICD-10-CM | POA: Diagnosis not present

## 2023-11-15 DIAGNOSIS — D329 Benign neoplasm of meninges, unspecified: Secondary | ICD-10-CM | POA: Diagnosis not present

## 2023-11-15 DIAGNOSIS — Z9181 History of falling: Secondary | ICD-10-CM | POA: Diagnosis not present

## 2023-11-15 DIAGNOSIS — Z7984 Long term (current) use of oral hypoglycemic drugs: Secondary | ICD-10-CM | POA: Diagnosis not present

## 2023-11-15 DIAGNOSIS — Z853 Personal history of malignant neoplasm of breast: Secondary | ICD-10-CM | POA: Diagnosis not present

## 2023-11-15 DIAGNOSIS — G301 Alzheimer's disease with late onset: Secondary | ICD-10-CM | POA: Diagnosis not present

## 2023-11-15 DIAGNOSIS — E119 Type 2 diabetes mellitus without complications: Secondary | ICD-10-CM | POA: Diagnosis not present

## 2023-11-15 DIAGNOSIS — Z87820 Personal history of traumatic brain injury: Secondary | ICD-10-CM | POA: Diagnosis not present

## 2023-11-15 DIAGNOSIS — H538 Other visual disturbances: Secondary | ICD-10-CM | POA: Diagnosis not present

## 2023-11-15 DIAGNOSIS — Z604 Social exclusion and rejection: Secondary | ICD-10-CM | POA: Diagnosis not present

## 2023-11-15 DIAGNOSIS — G4700 Insomnia, unspecified: Secondary | ICD-10-CM | POA: Diagnosis not present

## 2023-11-15 DIAGNOSIS — M21062 Valgus deformity, not elsewhere classified, left knee: Secondary | ICD-10-CM | POA: Diagnosis not present

## 2023-11-15 DIAGNOSIS — E785 Hyperlipidemia, unspecified: Secondary | ICD-10-CM | POA: Diagnosis not present

## 2023-11-16 DIAGNOSIS — M21062 Valgus deformity, not elsewhere classified, left knee: Secondary | ICD-10-CM | POA: Diagnosis not present

## 2023-11-16 DIAGNOSIS — G4700 Insomnia, unspecified: Secondary | ICD-10-CM | POA: Diagnosis not present

## 2023-11-16 DIAGNOSIS — D329 Benign neoplasm of meninges, unspecified: Secondary | ICD-10-CM | POA: Diagnosis not present

## 2023-11-16 DIAGNOSIS — E119 Type 2 diabetes mellitus without complications: Secondary | ICD-10-CM | POA: Diagnosis not present

## 2023-11-16 DIAGNOSIS — G44209 Tension-type headache, unspecified, not intractable: Secondary | ICD-10-CM | POA: Diagnosis not present

## 2023-11-16 DIAGNOSIS — G301 Alzheimer's disease with late onset: Secondary | ICD-10-CM | POA: Diagnosis not present

## 2023-11-20 DIAGNOSIS — Z7984 Long term (current) use of oral hypoglycemic drugs: Secondary | ICD-10-CM | POA: Diagnosis not present

## 2023-11-20 DIAGNOSIS — E538 Deficiency of other specified B group vitamins: Secondary | ICD-10-CM | POA: Diagnosis not present

## 2023-11-20 DIAGNOSIS — Z853 Personal history of malignant neoplasm of breast: Secondary | ICD-10-CM | POA: Diagnosis not present

## 2023-11-20 DIAGNOSIS — D329 Benign neoplasm of meninges, unspecified: Secondary | ICD-10-CM | POA: Diagnosis not present

## 2023-11-20 DIAGNOSIS — Z9181 History of falling: Secondary | ICD-10-CM | POA: Diagnosis not present

## 2023-11-20 DIAGNOSIS — Z604 Social exclusion and rejection: Secondary | ICD-10-CM | POA: Diagnosis not present

## 2023-11-20 DIAGNOSIS — E119 Type 2 diabetes mellitus without complications: Secondary | ICD-10-CM | POA: Diagnosis not present

## 2023-11-20 DIAGNOSIS — G44209 Tension-type headache, unspecified, not intractable: Secondary | ICD-10-CM | POA: Diagnosis not present

## 2023-11-20 DIAGNOSIS — G4700 Insomnia, unspecified: Secondary | ICD-10-CM | POA: Diagnosis not present

## 2023-11-20 DIAGNOSIS — M21062 Valgus deformity, not elsewhere classified, left knee: Secondary | ICD-10-CM | POA: Diagnosis not present

## 2023-11-20 DIAGNOSIS — Z87820 Personal history of traumatic brain injury: Secondary | ICD-10-CM | POA: Diagnosis not present

## 2023-11-20 DIAGNOSIS — E785 Hyperlipidemia, unspecified: Secondary | ICD-10-CM | POA: Diagnosis not present

## 2023-11-20 DIAGNOSIS — G301 Alzheimer's disease with late onset: Secondary | ICD-10-CM | POA: Diagnosis not present

## 2023-11-20 DIAGNOSIS — H538 Other visual disturbances: Secondary | ICD-10-CM | POA: Diagnosis not present

## 2023-11-21 ENCOUNTER — Other Ambulatory Visit: Payer: Self-pay | Admitting: Nurse Practitioner

## 2023-11-21 DIAGNOSIS — E1169 Type 2 diabetes mellitus with other specified complication: Secondary | ICD-10-CM

## 2023-11-22 DIAGNOSIS — M21062 Valgus deformity, not elsewhere classified, left knee: Secondary | ICD-10-CM | POA: Diagnosis not present

## 2023-11-22 DIAGNOSIS — D329 Benign neoplasm of meninges, unspecified: Secondary | ICD-10-CM | POA: Diagnosis not present

## 2023-11-22 DIAGNOSIS — Z604 Social exclusion and rejection: Secondary | ICD-10-CM | POA: Diagnosis not present

## 2023-11-22 DIAGNOSIS — Z7984 Long term (current) use of oral hypoglycemic drugs: Secondary | ICD-10-CM | POA: Diagnosis not present

## 2023-11-22 DIAGNOSIS — E119 Type 2 diabetes mellitus without complications: Secondary | ICD-10-CM | POA: Diagnosis not present

## 2023-11-22 DIAGNOSIS — Z87820 Personal history of traumatic brain injury: Secondary | ICD-10-CM | POA: Diagnosis not present

## 2023-11-22 DIAGNOSIS — Z853 Personal history of malignant neoplasm of breast: Secondary | ICD-10-CM | POA: Diagnosis not present

## 2023-11-22 DIAGNOSIS — E538 Deficiency of other specified B group vitamins: Secondary | ICD-10-CM | POA: Diagnosis not present

## 2023-11-22 DIAGNOSIS — Z9181 History of falling: Secondary | ICD-10-CM | POA: Diagnosis not present

## 2023-11-22 DIAGNOSIS — H538 Other visual disturbances: Secondary | ICD-10-CM | POA: Diagnosis not present

## 2023-11-22 DIAGNOSIS — E785 Hyperlipidemia, unspecified: Secondary | ICD-10-CM | POA: Diagnosis not present

## 2023-11-22 DIAGNOSIS — G301 Alzheimer's disease with late onset: Secondary | ICD-10-CM | POA: Diagnosis not present

## 2023-11-22 DIAGNOSIS — G4700 Insomnia, unspecified: Secondary | ICD-10-CM | POA: Diagnosis not present

## 2023-11-22 DIAGNOSIS — G44209 Tension-type headache, unspecified, not intractable: Secondary | ICD-10-CM | POA: Diagnosis not present

## 2023-11-27 DIAGNOSIS — Z9181 History of falling: Secondary | ICD-10-CM | POA: Diagnosis not present

## 2023-11-27 DIAGNOSIS — H538 Other visual disturbances: Secondary | ICD-10-CM | POA: Diagnosis not present

## 2023-11-27 DIAGNOSIS — E119 Type 2 diabetes mellitus without complications: Secondary | ICD-10-CM | POA: Diagnosis not present

## 2023-11-27 DIAGNOSIS — Z87820 Personal history of traumatic brain injury: Secondary | ICD-10-CM | POA: Diagnosis not present

## 2023-11-27 DIAGNOSIS — G4700 Insomnia, unspecified: Secondary | ICD-10-CM | POA: Diagnosis not present

## 2023-11-27 DIAGNOSIS — E785 Hyperlipidemia, unspecified: Secondary | ICD-10-CM | POA: Diagnosis not present

## 2023-11-27 DIAGNOSIS — G44209 Tension-type headache, unspecified, not intractable: Secondary | ICD-10-CM | POA: Diagnosis not present

## 2023-11-27 DIAGNOSIS — G301 Alzheimer's disease with late onset: Secondary | ICD-10-CM | POA: Diagnosis not present

## 2023-11-27 DIAGNOSIS — Z7984 Long term (current) use of oral hypoglycemic drugs: Secondary | ICD-10-CM | POA: Diagnosis not present

## 2023-11-27 DIAGNOSIS — M21062 Valgus deformity, not elsewhere classified, left knee: Secondary | ICD-10-CM | POA: Diagnosis not present

## 2023-11-27 DIAGNOSIS — Z853 Personal history of malignant neoplasm of breast: Secondary | ICD-10-CM | POA: Diagnosis not present

## 2023-11-27 DIAGNOSIS — D329 Benign neoplasm of meninges, unspecified: Secondary | ICD-10-CM | POA: Diagnosis not present

## 2023-11-27 DIAGNOSIS — Z604 Social exclusion and rejection: Secondary | ICD-10-CM | POA: Diagnosis not present

## 2023-11-27 DIAGNOSIS — E538 Deficiency of other specified B group vitamins: Secondary | ICD-10-CM | POA: Diagnosis not present

## 2023-12-06 DIAGNOSIS — G44209 Tension-type headache, unspecified, not intractable: Secondary | ICD-10-CM | POA: Diagnosis not present

## 2023-12-06 DIAGNOSIS — M21062 Valgus deformity, not elsewhere classified, left knee: Secondary | ICD-10-CM | POA: Diagnosis not present

## 2023-12-06 DIAGNOSIS — E785 Hyperlipidemia, unspecified: Secondary | ICD-10-CM | POA: Diagnosis not present

## 2023-12-06 DIAGNOSIS — R42 Dizziness and giddiness: Secondary | ICD-10-CM | POA: Diagnosis not present

## 2023-12-06 DIAGNOSIS — H538 Other visual disturbances: Secondary | ICD-10-CM | POA: Diagnosis not present

## 2023-12-06 DIAGNOSIS — G301 Alzheimer's disease with late onset: Secondary | ICD-10-CM | POA: Diagnosis not present

## 2023-12-06 DIAGNOSIS — Z87820 Personal history of traumatic brain injury: Secondary | ICD-10-CM | POA: Diagnosis not present

## 2023-12-06 DIAGNOSIS — R269 Unspecified abnormalities of gait and mobility: Secondary | ICD-10-CM | POA: Diagnosis not present

## 2023-12-06 DIAGNOSIS — Z9181 History of falling: Secondary | ICD-10-CM | POA: Diagnosis not present

## 2023-12-06 DIAGNOSIS — M25362 Other instability, left knee: Secondary | ICD-10-CM | POA: Diagnosis not present

## 2023-12-06 DIAGNOSIS — E538 Deficiency of other specified B group vitamins: Secondary | ICD-10-CM | POA: Diagnosis not present

## 2023-12-06 DIAGNOSIS — Z604 Social exclusion and rejection: Secondary | ICD-10-CM | POA: Diagnosis not present

## 2023-12-06 DIAGNOSIS — G4700 Insomnia, unspecified: Secondary | ICD-10-CM | POA: Diagnosis not present

## 2023-12-06 DIAGNOSIS — Z853 Personal history of malignant neoplasm of breast: Secondary | ICD-10-CM | POA: Diagnosis not present

## 2023-12-06 DIAGNOSIS — E119 Type 2 diabetes mellitus without complications: Secondary | ICD-10-CM | POA: Diagnosis not present

## 2023-12-06 DIAGNOSIS — R609 Edema, unspecified: Secondary | ICD-10-CM | POA: Diagnosis not present

## 2023-12-06 DIAGNOSIS — D329 Benign neoplasm of meninges, unspecified: Secondary | ICD-10-CM | POA: Diagnosis not present

## 2023-12-06 DIAGNOSIS — Z7984 Long term (current) use of oral hypoglycemic drugs: Secondary | ICD-10-CM | POA: Diagnosis not present

## 2023-12-07 ENCOUNTER — Other Ambulatory Visit: Payer: Self-pay | Admitting: Student

## 2023-12-07 DIAGNOSIS — R42 Dizziness and giddiness: Secondary | ICD-10-CM

## 2023-12-07 DIAGNOSIS — R269 Unspecified abnormalities of gait and mobility: Secondary | ICD-10-CM

## 2023-12-10 DIAGNOSIS — E538 Deficiency of other specified B group vitamins: Secondary | ICD-10-CM | POA: Diagnosis not present

## 2023-12-10 DIAGNOSIS — G44209 Tension-type headache, unspecified, not intractable: Secondary | ICD-10-CM | POA: Diagnosis not present

## 2023-12-10 DIAGNOSIS — Z9181 History of falling: Secondary | ICD-10-CM | POA: Diagnosis not present

## 2023-12-10 DIAGNOSIS — Z604 Social exclusion and rejection: Secondary | ICD-10-CM | POA: Diagnosis not present

## 2023-12-10 DIAGNOSIS — G4700 Insomnia, unspecified: Secondary | ICD-10-CM | POA: Diagnosis not present

## 2023-12-10 DIAGNOSIS — H538 Other visual disturbances: Secondary | ICD-10-CM | POA: Diagnosis not present

## 2023-12-10 DIAGNOSIS — Z853 Personal history of malignant neoplasm of breast: Secondary | ICD-10-CM | POA: Diagnosis not present

## 2023-12-10 DIAGNOSIS — D329 Benign neoplasm of meninges, unspecified: Secondary | ICD-10-CM | POA: Diagnosis not present

## 2023-12-10 DIAGNOSIS — M21062 Valgus deformity, not elsewhere classified, left knee: Secondary | ICD-10-CM | POA: Diagnosis not present

## 2023-12-10 DIAGNOSIS — Z7984 Long term (current) use of oral hypoglycemic drugs: Secondary | ICD-10-CM | POA: Diagnosis not present

## 2023-12-10 DIAGNOSIS — Z87820 Personal history of traumatic brain injury: Secondary | ICD-10-CM | POA: Diagnosis not present

## 2023-12-10 DIAGNOSIS — E119 Type 2 diabetes mellitus without complications: Secondary | ICD-10-CM | POA: Diagnosis not present

## 2023-12-10 DIAGNOSIS — E785 Hyperlipidemia, unspecified: Secondary | ICD-10-CM | POA: Diagnosis not present

## 2023-12-10 DIAGNOSIS — G301 Alzheimer's disease with late onset: Secondary | ICD-10-CM | POA: Diagnosis not present

## 2023-12-12 ENCOUNTER — Ambulatory Visit
Admission: RE | Admit: 2023-12-12 | Discharge: 2023-12-12 | Disposition: A | Discharge status: Home / Self Care | Payer: Medicare Other | Source: Ambulatory Visit | Attending: Student | Admitting: Student

## 2023-12-12 DIAGNOSIS — R269 Unspecified abnormalities of gait and mobility: Secondary | ICD-10-CM | POA: Diagnosis not present

## 2023-12-12 DIAGNOSIS — R42 Dizziness and giddiness: Secondary | ICD-10-CM | POA: Diagnosis not present

## 2023-12-12 DIAGNOSIS — I6521 Occlusion and stenosis of right carotid artery: Secondary | ICD-10-CM | POA: Diagnosis not present

## 2023-12-18 ENCOUNTER — Encounter: Payer: Self-pay | Admitting: Nurse Practitioner

## 2023-12-18 ENCOUNTER — Ambulatory Visit: Payer: Medicare Other | Admitting: Nurse Practitioner

## 2023-12-18 VITALS — BP 120/70 | HR 76 | Temp 98.1°F | Ht 66.0 in | Wt 173.8 lb

## 2023-12-18 DIAGNOSIS — E785 Hyperlipidemia, unspecified: Secondary | ICD-10-CM

## 2023-12-18 DIAGNOSIS — J069 Acute upper respiratory infection, unspecified: Secondary | ICD-10-CM

## 2023-12-18 DIAGNOSIS — E1169 Type 2 diabetes mellitus with other specified complication: Secondary | ICD-10-CM

## 2023-12-18 MED ORDER — DOXYCYCLINE HYCLATE 100 MG PO TABS: 100.0000 mg | ORAL_TABLET | Freq: Two times a day (BID) | ORAL | 0 refills | Status: DC

## 2023-12-18 NOTE — Assessment & Plan Note (Addendum)
Persistent cough and congestion have lasted over two weeks without shortness of breath, sore throat, fever, or vomiting. Over-the-counter Mucinex and cold/flu liquid have not provided relief. There is no known exposure to flu or COVID-19. Physical examination shows clear lungs and no ear or throat infection. Start Doxycycline 100mg  twice daily for one week. Continue Mucinex as tolerated and adequate fluid intake. Return precautions given to patient and family.

## 2023-12-18 NOTE — Progress Notes (Signed)
Gabriela Dicker, NP-C Phone: [phone]  Gabriela [name] is a 78 y.o. female who presents today for congestion.   Discussed the use of AI scribe software for clinical note transcription with the patient, who gave verbal consent to proceed.  History of Present Illness   Gabriela [name] is a 78 year old female who presents with a persistent cough for over two weeks. She is present with her husband.   She has been experiencing a persistent cough for over two weeks, which is significant and noticeable compared to her normal state. Despite using over-the-counter medications like Mucinex and a liquid cold and flu remedy, there has been no relief or improvement in her condition.  She experiences a lot of mucus and drainage at times and feels 'swimmy headed' due to congestion. No shortness of breath, throat pain, ear pain, fever, excessive tiredness, or vomiting. She has not been exposed to known illnesses such as the flu or COVID-19.  Her sleep is not significantly disrupted by the cough, allowing her to sleep well at night. She drinks a lot of fluids, which she finds helpful in thinning the mucus. She prefers Mucinex over other medications due to taste preferences.      Social History   Tobacco Use  Smoking Status Never  Smokeless Tobacco Never    Current Outpatient Medications on File Prior to Visit  Medication Sig Dispense Refill   cetirizine (ZYRTEC) 10 MG tablet Take 10 mg by mouth.     Cholecalciferol (VITAMIN D) 2000 units tablet Take by mouth.     cyclobenzaprine (FLEXERIL) 5 MG tablet Take ONE tab PO BID/TID PRN (Patient not taking: Reported on 08/23/2023)     gabapentin (NEURONTIN) 300 MG capsule Take 1 capsule every day by oral route at bedtime.     Lancets (ONETOUCH DELICA PLUS LANCET33G) MISC USE TO CHECK BLOOD SUGAR  DAILY 100 each 3   meloxicam (MOBIC) 15 MG tablet Take 15 mg by mouth daily. (Patient not taking: Reported on 08/23/2023)     metFORMIN (GLUCOPHAGE-XR) 500 MG 24  hr tablet Take 1 tablet (500 mg total) by mouth 2 (two) times daily. 180 tablet 3   Multiple Vitamins-Minerals (MULTIVITAMIN WOMEN 50+ PO) Take 1 tablet by mouth daily.     ONETOUCH ULTRA test strip USE ONCE DAILY 100 strip 3   pravastatin (PRAVACHOL) 20 MG tablet TAKE 1 TABLET BY MOUTH DAILY 90 tablet 3   QUEtiapine (SEROQUEL) 25 MG tablet Take 25 mg by mouth at bedtime. (Patient not taking: Reported on 08/23/2023)     tamoxifen (NOLVADEX) 20 MG tablet TAKE 1 TABLET BY MOUTH DAILY (Patient not taking: Reported on 08/23/2023) 100 tablet 2   No current facility-administered medications on file prior to visit.    ROS see history of present illness  Objective  Physical Exam Vitals:   12/18/23 1052  BP: 120/70  Pulse: 76  Temp: 98.1 F (36.7 C)  SpO2: 98%    BP Readings from Last 3 Encounters:  12/18/23 120/70  08/23/23 110/72  07/04/23 132/82   Wt Readings from Last 3 Encounters:  12/18/23 173 lb 12.8 oz (78.8 kg)  08/23/23 168 lb 6.4 oz (76.4 kg)  07/04/23 171 lb 12.8 oz (77.9 kg)    Physical Exam Constitutional:      General: She is not in acute distress.    Appearance: Normal appearance.  HENT:     Head: Normocephalic.     Right Ear: Tympanic membrane normal.     Left  Ear: Tympanic membrane normal.     Nose: Congestion present.     Mouth/Throat:     Mouth: Mucous membranes are moist.     Pharynx: Oropharynx is clear.  Eyes:     Conjunctiva/sclera: Conjunctivae normal.     Pupils: Pupils are equal, round, and reactive to light.  Cardiovascular:     Rate and Rhythm: Normal rate and regular rhythm.     Heart sounds: Normal heart sounds.  Pulmonary:     Effort: Pulmonary effort is normal.     Breath sounds: Normal breath sounds.  Lymphadenopathy:     Cervical: No cervical adenopathy.  Skin:    General: Skin is warm and dry.  Neurological:     General: No focal deficit present.     Mental Status: She is alert.  Psychiatric:        Mood and Affect: Mood  normal.        Behavior: Behavior normal.    Assessment/Plan: Please see individual problem list.  URI with cough and congestion Assessment & Plan: Persistent cough and congestion have lasted over two weeks without shortness of breath, sore throat, fever, or vomiting. Over-the-counter Mucinex and cold/flu liquid have not provided relief. There is no known exposure to flu or COVID-19. Physical examination shows clear lungs and no ear or throat infection. Start Doxycycline 100mg  twice daily for one week. Continue Mucinex as tolerated and adequate fluid intake. Return precautions given to patient and family.   Orders: -     Doxycycline Hyclate; Take 1 tablet (100 mg total) by mouth 2 (two) times daily.  Dispense: 14 tablet; Refill: 0    Return if symptoms worsen or fail to improve.   Gabriela Dicker, NP-C [facility] - [facility]

## 2023-12-19 DIAGNOSIS — Z853 Personal history of malignant neoplasm of breast: Secondary | ICD-10-CM | POA: Diagnosis not present

## 2023-12-19 DIAGNOSIS — E119 Type 2 diabetes mellitus without complications: Secondary | ICD-10-CM | POA: Diagnosis not present

## 2023-12-19 DIAGNOSIS — G4700 Insomnia, unspecified: Secondary | ICD-10-CM | POA: Diagnosis not present

## 2023-12-19 DIAGNOSIS — G301 Alzheimer's disease with late onset: Secondary | ICD-10-CM | POA: Diagnosis not present

## 2023-12-19 DIAGNOSIS — G44209 Tension-type headache, unspecified, not intractable: Secondary | ICD-10-CM | POA: Diagnosis not present

## 2023-12-19 DIAGNOSIS — D329 Benign neoplasm of meninges, unspecified: Secondary | ICD-10-CM | POA: Diagnosis not present

## 2023-12-19 DIAGNOSIS — E785 Hyperlipidemia, unspecified: Secondary | ICD-10-CM | POA: Diagnosis not present

## 2023-12-19 DIAGNOSIS — E538 Deficiency of other specified B group vitamins: Secondary | ICD-10-CM | POA: Diagnosis not present

## 2023-12-19 DIAGNOSIS — Z604 Social exclusion and rejection: Secondary | ICD-10-CM | POA: Diagnosis not present

## 2023-12-19 DIAGNOSIS — Z9181 History of falling: Secondary | ICD-10-CM | POA: Diagnosis not present

## 2023-12-19 DIAGNOSIS — M21062 Valgus deformity, not elsewhere classified, left knee: Secondary | ICD-10-CM | POA: Diagnosis not present

## 2023-12-19 DIAGNOSIS — Z7984 Long term (current) use of oral hypoglycemic drugs: Secondary | ICD-10-CM | POA: Diagnosis not present

## 2023-12-19 DIAGNOSIS — H538 Other visual disturbances: Secondary | ICD-10-CM | POA: Diagnosis not present

## 2023-12-19 DIAGNOSIS — Z87820 Personal history of traumatic brain injury: Secondary | ICD-10-CM | POA: Diagnosis not present

## 2023-12-20 DIAGNOSIS — M21062 Valgus deformity, not elsewhere classified, left knee: Secondary | ICD-10-CM | POA: Diagnosis not present

## 2023-12-20 DIAGNOSIS — Z853 Personal history of malignant neoplasm of breast: Secondary | ICD-10-CM | POA: Diagnosis not present

## 2023-12-20 DIAGNOSIS — Z604 Social exclusion and rejection: Secondary | ICD-10-CM | POA: Diagnosis not present

## 2023-12-20 DIAGNOSIS — Z87820 Personal history of traumatic brain injury: Secondary | ICD-10-CM | POA: Diagnosis not present

## 2023-12-20 DIAGNOSIS — G44209 Tension-type headache, unspecified, not intractable: Secondary | ICD-10-CM | POA: Diagnosis not present

## 2023-12-20 DIAGNOSIS — H538 Other visual disturbances: Secondary | ICD-10-CM | POA: Diagnosis not present

## 2023-12-20 DIAGNOSIS — E119 Type 2 diabetes mellitus without complications: Secondary | ICD-10-CM | POA: Diagnosis not present

## 2023-12-20 DIAGNOSIS — E538 Deficiency of other specified B group vitamins: Secondary | ICD-10-CM | POA: Diagnosis not present

## 2023-12-20 DIAGNOSIS — Z7984 Long term (current) use of oral hypoglycemic drugs: Secondary | ICD-10-CM | POA: Diagnosis not present

## 2023-12-20 DIAGNOSIS — Z9181 History of falling: Secondary | ICD-10-CM | POA: Diagnosis not present

## 2023-12-20 DIAGNOSIS — D329 Benign neoplasm of meninges, unspecified: Secondary | ICD-10-CM | POA: Diagnosis not present

## 2023-12-20 DIAGNOSIS — G4700 Insomnia, unspecified: Secondary | ICD-10-CM | POA: Diagnosis not present

## 2023-12-20 DIAGNOSIS — G301 Alzheimer's disease with late onset: Secondary | ICD-10-CM | POA: Diagnosis not present

## 2023-12-20 DIAGNOSIS — E785 Hyperlipidemia, unspecified: Secondary | ICD-10-CM | POA: Diagnosis not present

## 2023-12-25 DIAGNOSIS — M25562 Pain in left knee: Secondary | ICD-10-CM | POA: Diagnosis not present

## 2023-12-25 DIAGNOSIS — M1712 Unilateral primary osteoarthritis, left knee: Secondary | ICD-10-CM | POA: Diagnosis not present

## 2023-12-25 DIAGNOSIS — M25472 Effusion, left ankle: Secondary | ICD-10-CM | POA: Diagnosis not present

## 2023-12-25 DIAGNOSIS — M25572 Pain in left ankle and joints of left foot: Secondary | ICD-10-CM | POA: Diagnosis not present

## 2023-12-25 DIAGNOSIS — G8929 Other chronic pain: Secondary | ICD-10-CM | POA: Diagnosis not present

## 2023-12-26 ENCOUNTER — Telehealth: Payer: Self-pay

## 2023-12-26 NOTE — Telephone Encounter (Signed)
Copied from CRM [id number]. Topic: Clinical - Home Health Verbal Orders >> Dec 26, 2023  4:06 PM Dennison Nancy wrote: Caller/Agency: Stacie(nurse) -[facility]  Callback Number: [phone] Service Requested: family reach out to hospice and want to put patient in hospice , patient is declining , requesting for Kacy lester to reach out to [facility]  Frequency: NA Any new concerns about the patient? Yes , patient is declining

## 2023-12-27 ENCOUNTER — Other Ambulatory Visit: Payer: Self-pay | Admitting: Nurse Practitioner

## 2023-12-27 DIAGNOSIS — D329 Benign neoplasm of meninges, unspecified: Secondary | ICD-10-CM | POA: Diagnosis not present

## 2023-12-27 DIAGNOSIS — G44209 Tension-type headache, unspecified, not intractable: Secondary | ICD-10-CM | POA: Diagnosis not present

## 2023-12-27 DIAGNOSIS — Z853 Personal history of malignant neoplasm of breast: Secondary | ICD-10-CM | POA: Diagnosis not present

## 2023-12-27 DIAGNOSIS — Z7984 Long term (current) use of oral hypoglycemic drugs: Secondary | ICD-10-CM | POA: Diagnosis not present

## 2023-12-27 DIAGNOSIS — E119 Type 2 diabetes mellitus without complications: Secondary | ICD-10-CM | POA: Diagnosis not present

## 2023-12-27 DIAGNOSIS — Z87820 Personal history of traumatic brain injury: Secondary | ICD-10-CM | POA: Diagnosis not present

## 2023-12-27 DIAGNOSIS — Z604 Social exclusion and rejection: Secondary | ICD-10-CM | POA: Diagnosis not present

## 2023-12-27 DIAGNOSIS — Z9181 History of falling: Secondary | ICD-10-CM | POA: Diagnosis not present

## 2023-12-27 DIAGNOSIS — F0280 Dementia in other diseases classified elsewhere without behavioral disturbance: Secondary | ICD-10-CM

## 2023-12-27 DIAGNOSIS — M21062 Valgus deformity, not elsewhere classified, left knee: Secondary | ICD-10-CM | POA: Diagnosis not present

## 2023-12-27 DIAGNOSIS — H538 Other visual disturbances: Secondary | ICD-10-CM | POA: Diagnosis not present

## 2023-12-27 DIAGNOSIS — G4700 Insomnia, unspecified: Secondary | ICD-10-CM | POA: Diagnosis not present

## 2023-12-27 DIAGNOSIS — E785 Hyperlipidemia, unspecified: Secondary | ICD-10-CM | POA: Diagnosis not present

## 2023-12-27 DIAGNOSIS — E538 Deficiency of other specified B group vitamins: Secondary | ICD-10-CM | POA: Diagnosis not present

## 2023-12-27 DIAGNOSIS — G301 Alzheimer's disease with late onset: Secondary | ICD-10-CM | POA: Diagnosis not present

## 2023-12-28 ENCOUNTER — Telehealth: Payer: Self-pay | Admitting: *Deleted

## 2023-12-28 NOTE — Progress Notes (Signed)
Complex Care Management Note  Care Guide Note 12/28/2023 Name: Gabriela [name] MRN: [id number] DOB: 1945/11/08  Gabriela [name] is a 78 y.o. year old female who sees [name], NP for primary care. I reached out to [name] by phone today to offer complex care management services.  [name] daughter [name] was given information about Complex Care Management services today including:   The Complex Care Management services include support from the care team which includes your Nurse Care Manager, Clinical Social Worker, or Pharmacist.  The Complex Care Management team is here to help remove barriers to the health concerns and goals most important to you. Complex Care Management services are voluntary, and the patient may decline or stop services at any time by request to their care team member.   Complex Care Management Consent Status: Patient daughter [name] agreed to services and verbal consent obtained.   Follow up plan:  Telephone appointment with complex care management team member scheduled for:  2/28  Encounter Outcome:  Patient Scheduled  Gwenevere Ghazi  [facility]  [facility], [facility] Guide  Direct Dial: [phone]  Fax [phone]

## 2024-01-03 DIAGNOSIS — H903 Sensorineural hearing loss, bilateral: Secondary | ICD-10-CM | POA: Diagnosis not present

## 2024-01-03 DIAGNOSIS — R42 Dizziness and giddiness: Secondary | ICD-10-CM | POA: Diagnosis not present

## 2024-01-04 ENCOUNTER — Ambulatory Visit: Payer: Self-pay | Admitting: *Deleted

## 2024-01-04 NOTE — Patient Outreach (Signed)
 Care Coordination   Initial Visit Note   01/06/2024 Name: Gabriela [name] MRN: [id number] DOB: September 09, 1946  Gabriela [name] is a 78 y.o. year old female who sees [name], NP for primary care. I spoke with  [name] by phone today.  What matters to the patients health and wellness today?  Patient's daughter requesting resources for support as they care for patient. Patient's daughter discussed main challenge with basic hygiene and excessive sleep. Patient currently being followed by Neurology    Goals Addressed             This Visit's Progress    caregiver support resources       Activities and task to complete in order to accomplish goals.   Keep all upcoming appointment discussed today Continue with compliance of taking medication prescribed by Doctor Self Support options  (family to consider options for respite care, adult day care, and in home aid support ) CSW to complete referral for respite care and incontinent supplies through [facility] CSW to provide resources to daughter's email [email or web address]         SDOH assessments and interventions completed:  Yes  SDOH Interventions Today    Flowsheet Row Most Recent Value  SDOH Interventions   Food Insecurity Interventions Intervention Not Indicated  Housing Interventions Intervention Not Indicated  Transportation Interventions Intervention Not Indicated  Utilities Interventions Intervention Not Indicated        Care Coordination Interventions:  Yes, provided  Interventions Today    Flowsheet Row Most Recent Value  Chronic Disease   Chronic disease during today's visit Other, Diabetes  [alzheimers]  General Interventions   General Interventions Discussed/Reviewed General Interventions Discussed, [employer], Level of Care  [patient assessed for community resource needs related to her current medical condition. Daughter confirms excessive sleep, incontinence, wandering. Discussed  options of referral for respite care and incontinent program to address caregiver stress-]  Level of Care Adult Daycare, Personal Care Services  [Discussed options for Adult Day Programs for structure and socialization-in home aid care also discussed-resources provided]  Education Interventions   Education Provided Provided Education  Provided Verbal Education On [employer]  [related to Alzheimers caregiver support resoures]  Mental Health Interventions   Mental Health Discussed/Reviewed Mental Health Discussed, Coping Strategies  [caregiver stress acknowledged, self care reinforced, respite care resources provided]  Safety Interventions   Safety Discussed/Reviewed Safety Discussed  [confirmed that family has extra locks on the doors, med alert discussed, however declined due to possible misuse]  Advanced Directive Interventions   Advanced Directives Discussed/Reviewed Advanced Directives Discussed       Follow up plan: Follow up call scheduled for 01/21/24    Encounter Outcome:  Patient Visit Completed

## 2024-01-06 NOTE — Patient Instructions (Signed)
 Visit Information  Thank you for taking time to visit with me today. Please don't hesitate to contact me if I can be of assistance to you.   Following are the goals we discussed today:   Goals Addressed             This Visit's Progress    caregiver support resources       Activities and task to complete in order to accomplish goals.   Keep all upcoming appointment discussed today Continue with compliance of taking medication prescribed by Doctor Self Support options  (family to consider options for respite care, adult day care, and in home aid support ) CSW to complete referral for respite care and incontinent supplies through [facility] CSW to provide resources to daughter's email [email or web address]         Our next appointment is by telephone on 01/21/24 at 9am  Please call the care guide team at [phone] if you need to cancel or reschedule your appointment.   If you are experiencing a Mental Health or Behavioral Health Crisis or need someone to talk to, please call 911   Patient verbalizes understanding of instructions and care plan provided today and agrees to view in MyChart. Active MyChart status and patient understanding of how to access instructions and care plan via MyChart confirmed with patient.     Telephone follow up appointment with care management team member scheduled for: 01/21/24  Verna Czech, LCSW   [facility], [facility] Licensed Clinical Social Worker Care Coordinator  Direct Dial: [phone]

## 2024-01-07 ENCOUNTER — Telehealth: Payer: Self-pay

## 2024-01-07 NOTE — Telephone Encounter (Signed)
 Gabriela [name] dropped off FMLA paperwork for [name], NP, to sign.  I placed the paperwork in [name], NP's color folder up front.

## 2024-01-14 DIAGNOSIS — R42 Dizziness and giddiness: Secondary | ICD-10-CM | POA: Diagnosis not present

## 2024-01-21 ENCOUNTER — Ambulatory Visit: Payer: Self-pay | Admitting: *Deleted

## 2024-01-21 NOTE — Patient Instructions (Signed)
 Visit Information  Thank you for taking time to visit with me today. Please don't hesitate to contact me if I can be of assistance to you.   Following are the goals we discussed today:   Goals Addressed             This Visit's Progress    caregiver support resources       Activities and task to complete in order to accomplish goals.   Keep all upcoming appointment discussed today Continue with compliance of taking medication prescribed by Doctor Self Support options  (family to consider options for respite care, adult day care, and in home aid support ) Referral completed  for respite care through [facility] Daughter to schedule tour with the [facility] for possible enrollment Daughter to pick up incontinent supplies through [employer] supply drive this week 1/61-0/96 from 9am-2pm first come first served         Our next appointment is by telephone on 02/04/24 at 11am  Please call the care guide team at [phone] if you need to cancel or reschedule your appointment.   If you are experiencing a Mental Health or Behavioral Health Crisis or need someone to talk to, please call 911   Patient verbalizes understanding of instructions and care plan provided today and agrees to view in MyChart. Active MyChart status and patient understanding of how to access instructions and care plan via MyChart confirmed with patient.     Telephone follow up appointment with care management team member scheduled for: 02/04/24   Verna Czech, LCSW [place]  [facility], [facility] Licensed Clinical Social Worker Care Coordinator  Direct Dial: [phone]

## 2024-01-21 NOTE — Patient Outreach (Signed)
 Care Coordination   Follow Up Visit Note   01/21/2024 Name: Gabriela [name] MRN: [id number] DOB: May 01, 1946  Gabriela [name] is a 78 y.o. year old female who sees [name], NP for primary care. I spoke with  [name] by phone today.  What matters to the patients health and wellness today? Community resources to assist with patient care    Goals Addressed             This Visit's Progress    caregiver support resources       Activities and task to complete in order to accomplish goals.   Keep all upcoming appointment discussed today Continue with compliance of taking medication prescribed by Doctor Self Support options  (family to consider options for respite care, adult day care, and in home aid support ) Referral completed  for respite care through [facility] Daughter to schedule tour with the [facility] for possible enrollment Daughter to pick up incontinent supplies through [employer] supply drive this week 4/09-8/11 from 9am-2pm first come first served         SDOH assessments and interventions completed:  No     Care Coordination Interventions:  Yes, provided  Interventions Today    Flowsheet Row Most Recent Value  Chronic Disease   Chronic disease during today's visit Diabetes, Other  [alzheimer's]  General Interventions   General Interventions Discussed/Reviewed General Interventions Reviewed, Community Resources  [follow up on [employer] needs/referrals-confirmed continued concern related to patient's ability to complete her own ADL's as her condition continues to progress-spouse considering additional in home assistance]  Level of Care Adult Daycare, Personal Care Services  [continued to discuss options for private duty care , adult day care and incontinent supply program-encouraged daughter to schedule tour with [facility] for additioal information-referral for respite completed through  [facility]]  Education Interventions   Education Provided Provided Education  Provided Verbal Education On [employer] supply program discussed-available 3/17-3/21 from 9-2pm first come first served [address] [place]-daughter agreeable to follow up]       Follow up plan: Follow up call scheduled for 02/04/24    Encounter Outcome:  Patient Visit Completed

## 2024-01-23 ENCOUNTER — Telehealth: Payer: Self-pay

## 2024-01-23 NOTE — Telephone Encounter (Signed)
 Received FMLA paperwork placed in provider's colored folder.

## 2024-02-04 ENCOUNTER — Encounter: Payer: Self-pay | Admitting: *Deleted

## 2024-03-05 DIAGNOSIS — I6521 Occlusion and stenosis of right carotid artery: Secondary | ICD-10-CM | POA: Diagnosis not present

## 2024-03-05 DIAGNOSIS — D329 Benign neoplasm of meninges, unspecified: Secondary | ICD-10-CM | POA: Diagnosis not present

## 2024-03-05 DIAGNOSIS — E785 Hyperlipidemia, unspecified: Secondary | ICD-10-CM | POA: Diagnosis not present

## 2024-03-05 DIAGNOSIS — E1169 Type 2 diabetes mellitus with other specified complication: Secondary | ICD-10-CM | POA: Diagnosis not present

## 2024-03-05 DIAGNOSIS — G301 Alzheimer's disease with late onset: Secondary | ICD-10-CM | POA: Diagnosis not present

## 2024-03-05 DIAGNOSIS — Z87820 Personal history of traumatic brain injury: Secondary | ICD-10-CM | POA: Diagnosis not present

## 2024-03-06 DIAGNOSIS — I6521 Occlusion and stenosis of right carotid artery: Secondary | ICD-10-CM | POA: Insufficient documentation

## 2024-03-10 ENCOUNTER — Telehealth: Payer: Self-pay | Admitting: Nurse Practitioner

## 2024-03-10 NOTE — Telephone Encounter (Signed)
 Copied from CRM [id number]. Topic: Referral - Status >> Mar 10, 2024 12:21 PM Adaysia C wrote: Reason for CRM: Gabriela [name] with [facility] called to inform PCP they received the palliative care referral for this patient

## 2024-03-12 ENCOUNTER — Telehealth: Payer: Self-pay

## 2024-03-12 NOTE — Telephone Encounter (Signed)
 error

## 2024-04-21 ENCOUNTER — Other Ambulatory Visit: Payer: Self-pay | Admitting: Nurse Practitioner

## 2024-04-21 DIAGNOSIS — Z1231 Encounter for screening mammogram for malignant neoplasm of breast: Secondary | ICD-10-CM

## 2024-05-07 NOTE — Addendum Note (Signed)
 Addended by: Vonceil Upshur on: 05/07/2024 11:47 AM   Modules accepted: Orders

## 2024-05-16 ENCOUNTER — Ambulatory Visit
Admission: RE | Admit: 2024-05-16 | Discharge: 2024-05-16 | Disposition: A | Discharge status: Home / Self Care | Source: Ambulatory Visit | Attending: Nurse Practitioner | Admitting: Nurse Practitioner

## 2024-05-16 DIAGNOSIS — Z1231 Encounter for screening mammogram for malignant neoplasm of breast: Secondary | ICD-10-CM | POA: Diagnosis not present

## 2024-05-21 ENCOUNTER — Ambulatory Visit: Payer: Self-pay | Admitting: Nurse Practitioner

## 2024-05-29 ENCOUNTER — Encounter: Payer: Self-pay | Admitting: Nurse Practitioner

## 2024-06-03 ENCOUNTER — Other Ambulatory Visit: Payer: Self-pay | Admitting: Nurse Practitioner

## 2024-06-03 DIAGNOSIS — E119 Type 2 diabetes mellitus without complications: Secondary | ICD-10-CM

## 2024-06-04 ENCOUNTER — Ambulatory Visit (INDEPENDENT_AMBULATORY_CARE_PROVIDER_SITE_OTHER): Admitting: *Deleted

## 2024-06-04 ENCOUNTER — Encounter: Admitting: Nurse Practitioner

## 2024-06-04 VITALS — Ht 66.0 in | Wt 173.0 lb

## 2024-06-04 DIAGNOSIS — Z0000 Encounter for general adult medical examination without abnormal findings: Secondary | ICD-10-CM

## 2024-06-04 NOTE — Patient Instructions (Signed)
 Gabriela [name] , Thank you for taking time out of your busy schedule to complete your Annual Wellness Visit with me. I enjoyed our conversation and look forward to speaking with you again next year. I, as well as your care team,  appreciate your ongoing commitment to your health goals. Please review the following plan we discussed and let me know if I can assist you in the future. Your Game plan/ To Do List    Referrals: If you haven't heard from the office you've been referred to, please reach out to them at the phone provided.  Remember to call and schedule a diabetic eye exam. Consider updating shingles vaccines and get your flu and covid vaccines annually Follow up Visits:/ Next Medicare AWV with our clinical staff: 06/08/25 @ 2:30   Have you seen your provider in the last 6 months (3 months if uncontrolled diabetes)? Yes Next Office Visit with your provider: 08/08/24  Clinician Recommendations:  Aim for 30 minutes of exercise or brisk walking, 6-8 glasses of water, and 5 servings of fruits and vegetables each day.       This is a list of the screening recommended for you and due dates:  Health Maintenance  Topic Date Due   Zoster (Shingles) Vaccine (1 of 2) Never done   Yearly kidney health urinalysis for diabetes  02/04/2022   Complete foot exam   02/09/2022   COVID-19 Vaccine (6 - Pfizer risk 2024-25 season) 02/19/2024   Hemoglobin A1C  02/21/2024   Eye exam for diabetics  02/29/2024   Flu Shot  06/06/2024   Yearly kidney function blood test for diabetes  08/22/2024   Mammogram  05/16/2025   Medicare Annual Wellness Visit  06/04/2025   DTaP/Tdap/Td vaccine (3 - Td or Tdap) 06/16/2027   Pneumococcal Vaccine for age over 39  Completed   DEXA scan (bone density measurement)  Completed   Hepatitis C Screening  Completed   Hepatitis B Vaccine  Aged Out   HPV Vaccine  Aged Out   Meningitis B Vaccine  Aged Out   Colon Cancer Screening  Discontinued    Advanced directives: (In Chart) A  copy of your advanced directives are scanned into your chart should your provider ever need it. Advance Care Planning is important because it:  [x]  Makes sure you receive the medical care that is consistent with your values, goals, and preferences  [x]  It provides guidance to your family and loved ones and reduces their decisional burden about whether or not they are making the right decisions based on your wishes.

## 2024-06-04 NOTE — Progress Notes (Signed)
 Subjective:   Gabriela [name] is a 78 y.o. who presents for a Medicare Wellness preventive visit.  As a reminder, Annual Wellness Visits don't include a physical exam, and some assessments may be limited, especially if this visit is performed virtually. We may recommend an in-person follow-up visit with your provider if needed.  Visit Complete: Virtual I connected with  Gabriela [name] Seats on 06/04/24 by a audio enabled telemedicine application and verified that I am speaking with the correct person using two identifiers.  Patient Location: Home  Provider Location: Home Office  I discussed the limitations of evaluation and management by telemedicine. The patient expressed understanding and agreed to proceed.  Vital Signs: Because this visit was a virtual/telehealth visit, some criteria may be missing or patient reported. Any vitals not documented were not able to be obtained and vitals that have been documented are patient reported.  VideoDeclined- This patient declined Librarian, academic. Therefore the visit was completed with audio only.  Persons Participating in Visit: Patient assisted by Husband Gabriela [name].  AWV Questionnaire: No: Patient Medicare AWV questionnaire was not completed prior to this visit.  Cardiac Risk Factors include: advanced age (>55men, >64 women);diabetes mellitus;dyslipidemia     Objective:    Today's Vitals   06/04/24 0854  Weight: 173 lb (78.5 kg)  Height: 5' 6 (1.676 m)   Body mass index is 27.92 kg/m.     06/04/2024    9:10 AM 06/04/2023    9:21 AM 02/27/2023   10:51 AM 05/31/2020    8:18 AM 10/08/2019   12:02 PM 04/10/2019   10:43 AM 09/28/2018    6:29 AM  Advanced Directives  Does Patient Have a Medical Advance Directive? Yes Yes Yes Yes Yes Yes No   Type of Estate agent of [place];Living will Healthcare Power of [place];Living will Living will;Healthcare Power of [employer] Power of  [place];Living will Healthcare Power of [place];Living will Healthcare Power of [place];Living will   Does patient want to make changes to medical advance directive? No - Patient declined No - Patient declined Yes (ED - Information included in AVS)  No - Patient declined    Copy of Healthcare Power of Attorney in Chart? Yes - validated most recent copy scanned in chart (See row information) Yes - validated most recent copy scanned in chart (See row information)  No - copy requested Yes - validated most recent copy scanned in chart (See row information) No - copy requested    Would patient like information on creating a medical advance directive?   Yes (ED - Information included in AVS)    No - Patient declined      Data saved with a previous flowsheet row definition    Current Medications (verified) Outpatient Encounter Medications as of 06/04/2024  Medication Sig   cetirizine (ZYRTEC) 10 MG tablet Take 10 mg by mouth.   Lancets (ONETOUCH DELICA PLUS LANCET33G) MISC USE TO CHECK BLOOD SUGAR  DAILY   metFORMIN  (GLUCOPHAGE -XR) 500 MG 24 hr tablet TAKE 1 TABLET BY MOUTH TWICE DAILY   ONETOUCH ULTRA test strip USE ONCE DAILY   pravastatin  (PRAVACHOL ) 20 MG tablet TAKE 1 TABLET BY MOUTH DAILY   QUEtiapine (SEROQUEL) 25 MG tablet Take 25 mg by mouth at bedtime.   Cholecalciferol (VITAMIN D ) 2000 units tablet Take by mouth. (Patient not taking: Reported on 06/04/2024)   cyclobenzaprine (FLEXERIL) 5 MG tablet Take ONE tab PO BID/TID PRN (Patient not taking: Reported on 06/04/2024)  doxycycline  (VIBRA -TABS) 100 MG tablet Take 1 tablet (100 mg total) by mouth 2 (two) times daily. (Patient not taking: Reported on 06/04/2024)   gabapentin  (NEURONTIN ) 300 MG capsule Take 1 capsule every day by oral route at bedtime. (Patient not taking: Reported on 06/04/2024)   Multiple Vitamins-Minerals (MULTIVITAMIN WOMEN 50+ PO) Take 1 tablet by mouth daily. (Patient not taking: Reported on 06/04/2024)   tamoxifen   (NOLVADEX ) 20 MG tablet TAKE 1 TABLET BY MOUTH DAILY (Patient not taking: Reported on 06/04/2024)   No facility-administered encounter medications on file as of 06/04/2024.    Allergies (verified) Oxycontin [oxycodone hcl], Sulfur  dioxide, and Sulfa antibiotics   History: Past Medical History:  Diagnosis Date   Arthritis    Cancer (HCC)    left breast   Chronic pain    from fall 2012   Diabetes mellitus without complication (HCC)    diet controlled   History of closed head injury 2012   accident work threw her 100ft   Hyperlipemia    Malignant neoplasm of upper-outer quadrant of left breast in female, estrogen receptor positive (HCC) 05/17/2021   Personal history of radiation therapy    Spinal stenosis    Urinary incontinence    wears a pessery   Past Surgical History:  Procedure Laterality Date   ABDOMINAL HYSTERECTOMY     BREAST LUMPECTOMY  1999   left   BREAST LUMPECTOMY Left 2016   BREAST LUMPECTOMY WITH RADIOACTIVE SEED LOCALIZATION Left 03/08/2018   Procedure: LEFT BREAST LUMPECTOMY WITH RADIOACTIVE SEED LOCALIZATION;  Surgeon: Mikell Katz, MD;  Location: [facility];  Service: General;  Laterality: Left;   CATARACT EXTRACTION W/PHACO Left 10/08/2019   Procedure: CATARACT EXTRACTION PHACO AND INTRAOCULAR LENS PLACEMENT (IOC) LEFT 3.47, 00:46.0,  18.8%;  Surgeon: Mittie Gaskin, MD;  Location: [facility];  Service: Ophthalmology;  Laterality: Left;  Diabetes - oral meds   COLONOSCOPY     COLONOSCOPY WITH PROPOFOL  N/A 05/14/2018   Procedure: COLONOSCOPY WITH PROPOFOL ;  Surgeon: Therisa Bi, MD;  Location: [facility];  Service: Gastroenterology;  Laterality: N/A;   ORIF WRIST FRACTURE Right 06/15/2017   Procedure: OPEN REDUCTION INTERNAL FIXATION (ORIF) WRIST FRACTURE WITH REDUCTION AND PINNINGT OF CARPAL DISLOCATION;  Surgeon: Sebastian Lenis, MD;  Location: [facility] OR;  Service: Orthopedics;  Laterality: Right;   RADIOACTIVE SEED GUIDED PARTIAL  MASTECTOMY WITH AXILLARY SENTINEL LYMPH NODE BIOPSY Left 02/08/2015   Procedure: RADIOACTIVE SEED LOCALIZATION LUMPECTOMY WITH LEFT AXILLARY SENTINEL LYMPH NODE BIOPSY;  Surgeon: Katz Mikell, MD;  Location:  SURGERY CENTER;  Service: General;  Laterality: Left;   SHOULDER ARTHROSCOPY  2012   left   TONSILLECTOMY     Family History  Problem Relation Age of Onset   Colon cancer Mother        possibly caused by medication during pregnancy   Breast cancer Maternal Aunt        dx in 70s   Cervical cancer Maternal Aunt        dx in 36s   Social History   Socioeconomic History   Marital status: Married    Spouse name: Gabriela [name]   Number of children: 3   Years of education: some college   Highest education level: 12th grade  Occupational History   Occupation: retired  Tobacco Use   Smoking status: Never   Smokeless tobacco: Never  Vaping Use   Vaping status: Never Used  Substance and Sexual Activity   Alcohol use: No   Drug use: No  Sexual activity: Not Currently    Partners: Male    Birth control/protection: Post-menopausal  Other Topics Concern   Not on file  Social History Narrative   The patient and her husband Gabriela [name] are both retired. They live on [address] acres in our quite busy caring for that. At is also Cecilee's chief source of exercise. She is a Control and instrumentation engineer.      02/09/21   From: the are   Living: with Gabriela [name], 1970   Work: retired - [employer]      Family:Daughter Camillo Barter is an Sports administrator in [place]. Daughter Clarita Dawn is a Veterinary surgeon in [place]. Son Greenley Martone works for the city of [place]. The patient has 4 grandchildren.      Enjoys: spend time with husband, gardening, and canning veggies/figs/jelly      Exercise: gardening and mowing the yard   Diet: healthy diabetic      Safety   Seat belts: Yes    Guns: Yes  and secure   Safe in relationships: Yes    Social Drivers of Corporate investment banker Strain: Low Risk  (06/04/2024)    Overall Financial Resource Strain (CARDIA)    Difficulty of Paying Living Expenses: Not hard at all  Food Insecurity: No Food Insecurity (06/04/2024)   Hunger Vital Sign    Worried About Running Out of Food in the Last Year: Never true    Ran Out of Food in the Last Year: Never true  Transportation Needs: No Transportation Needs (06/04/2024)   PRAPARE - Administrator, Civil Service (Medical): No    Lack of Transportation (Non-Medical): No  Physical Activity: Inactive (06/04/2024)   Exercise Vital Sign    Days of Exercise per Week: 0 days    Minutes of Exercise per Session: 0 min  Stress: No Stress Concern Present (06/04/2024)   [employer] of Occupational Health - Occupational Stress Questionnaire    Feeling of Stress: Not at all  Social Connections: Moderately Isolated (06/04/2024)   Social Connection and Isolation Panel    Frequency of Communication with Friends and Family: Never    Frequency of Social Gatherings with Friends and Family: Twice a week    Attends Religious Services: More than 4 times per year    Active Member of [employer] or Organizations: No    Attends Engineer, structural: Never    Marital Status: Married    Tobacco Counseling Counseling given: Not Answered    Clinical Intake:  Pre-visit preparation completed: Yes  Pain : No/denies pain     BMI - recorded: 27.92 Nutritional Status: BMI 25 -29 Overweight Nutritional Risks: None Diabetes: Yes CBG done?: No Did pt. bring in CBG monitor from home?: No  Lab Results  Component Value Date   HGBA1C 6.7 (H) 08/23/2023   HGBA1C 7.0 (H) 05/23/2023   HGBA1C 7.5 (H) 02/20/2023     How often do you need to have someone help you when you read instructions, pamphlets, or other written materials from your doctor or pharmacy?: 1 - Never  Interpreter Needed?: No  Information entered by :: R. Refoel Palladino LPN   Activities of Daily Living     06/04/2024    8:56 AM  In your present state of  health, do you have any difficulty performing the following activities:  Hearing? 0  Vision? 0  Difficulty concentrating or making decisions? 1  Walking or climbing stairs? 0  Dressing or bathing? 1  Doing errands, shopping? 1  Preparing Food and eating ? Y  Using the Toilet? N  In the past six months, have you accidently leaked urine? Y  Do you have problems with loss of bowel control? N  Managing your Medications? Y  Managing your Finances? Y  Housekeeping or managing your Housekeeping? Y    Patient Care Team: Gretel App, NP as PCP - General (Nurse Practitioner) Trixie File, MD as Consulting Physician (Internal Medicine) Therisa Bi, MD as Consulting Physician (Gastroenterology) [facility] (Optometry) [place], [place], [place] as [facility] [place], [place], [place]  I have updated your Care Teams any recent Medical Services you may have received from other providers in the past year.     Assessment:   This is a routine wellness examination for Merina.  Hearing/Vision screen Hearing Screening - Comments:: No issues Vision Screening - Comments:: readers   Goals Addressed             This Visit's Progress    Patient Stated       Wants to work on maintaining good health       Depression Screen     06/04/2024    9:04 AM 07/04/2023    9:40 AM 06/04/2023    9:17 AM 02/20/2023    1:03 PM 03/06/2022    2:18 PM 02/09/2021   10:58 AM 05/31/2020    8:20 AM  PHQ 2/9 Scores  PHQ - 2 Score 0 0 0 0 0 0 0  PHQ- 9 Score 3 0 0 3 0  0    Fall Risk     06/04/2024    8:58 AM 07/04/2023    9:40 AM 06/04/2023    9:10 AM 02/20/2023    1:03 PM 05/31/2020    8:19 AM  Fall Risk   Falls in the past year? 0 1 1 0 0  Number falls in past yr: 0 0 0 0 0  Injury with Fall? 0 1 1 0 0  Comment   injured ankle    Risk for fall due to : No Fall Risks History of fall(s) History of fall(s);Impaired balance/gait No Fall Risks Medication side effect  Risk for fall due to:  Comment   hands full of items    Follow up Falls evaluation completed;Falls prevention discussed Falls evaluation completed Falls evaluation completed;Education provided;Falls prevention discussed Falls evaluation completed Falls evaluation completed;Falls prevention discussed      Data saved with a previous flowsheet row definition    MEDICARE RISK AT HOME:  Medicare Risk at Home Any stairs in or around the home?: Yes If so, are there any without handrails?: No Home free of loose throw rugs in walkways, pet beds, electrical cords, etc?: Yes Adequate lighting in your home to reduce risk of falls?: Yes Life alert?: No Use of a cane, walker or w/c?: No Grab bars in the bathroom?: No Shower chair or bench in shower?: Yes Elevated toilet seat or a handicapped toilet?: No  TIMED UP AND GO:  Was the test performed?  No  Cognitive Function: 6CIT completed    05/31/2020    8:22 AM 04/10/2019   10:42 AM 04/08/2018   10:28 AM  MMSE - Mini Mental State Exam  Orientation to time 5 5 5   Orientation to Place 5 5 5   Registration 3 3 3   Attention/ Calculation 5 0 0  Recall 3 3 2   Recall-comments   unable to recall 1 of 3 words  Language- name 2 objects  0 0  Language- repeat 1 1 1   Language- follow 3 step command  0 3  Language- read & follow direction  0 0  Write a sentence  0 0  Copy design  0 0  Total score  17 19      02/09/2021   10:59 AM  Montreal Cognitive Assessment   Visuospatial/ Executive (0/5) 5  Naming (0/3) 3  Attention: Read list of digits (0/2) 2  Attention: Read list of letters (0/1) 1  Attention: Serial 7 subtraction starting at 100 (0/3) 3  Language: Repeat phrase (0/2) 2  Language : Fluency (0/1) 0  Abstraction (0/2) 2  Delayed Recall (0/5) 0  Orientation (0/6) 5  Total 23      06/04/2024    9:11 AM 06/04/2023    9:22 AM  6CIT Screen  What Year? 4 points 0 points  What month? 3 points 3 points  What time? 0 points 0 points  Count back from 20 4 points 0  points  Months in reverse 4 points 0 points  Repeat phrase 10 points 10 points  Total Score 25 points 13 points    Immunizations Immunization History  Administered Date(s) Administered   Fluad Quad(high Dose 65+) 08/18/2020   Influenza, High Dose Seasonal PF 08/16/2017, 07/26/2018   Influenza, Quadrivalent, Recombinant, Inj, Pf 07/25/2019, 09/09/2019   Influenza-Unspecified 08/05/2014, 08/06/2016, 08/15/2017   PFIZER(Purple Top)SARS-COV-2 Vaccination 12/09/2019, 01/01/2020, 08/09/2020   Pneumococcal Conjugate-13 04/17/2018   Pneumococcal Polysaccharide-23 02/05/2008, 05/16/2019   Td 11/06/2006   Tdap 06/15/2017    Screening Tests Health Maintenance  Topic Date Due   Zoster Vaccines- Shingrix (1 of 2) Never done   Diabetic kidney evaluation - Urine ACR  02/04/2022   FOOT EXAM  02/09/2022   COVID-19 Vaccine (4 - 2024-25 season) 07/08/2023   HEMOGLOBIN A1C  02/21/2024   OPHTHALMOLOGY EXAM  02/29/2024   Medicare Annual Wellness (AWV)  06/03/2024   INFLUENZA VACCINE  06/06/2024   Diabetic kidney evaluation - eGFR measurement  08/22/2024   DTaP/Tdap/Td (3 - Td or Tdap) 06/16/2027   Pneumococcal Vaccine: 50+ Years  Completed   DEXA SCAN  Completed   Hepatitis C Screening  Completed   Hepatitis B Vaccines  Aged Out   HPV VACCINES  Aged Out   Meningococcal B Vaccine  Aged Out   Colonoscopy  Discontinued    Health Maintenance  Health Maintenance Due  Topic Date Due   Zoster Vaccines- Shingrix (1 of 2) Never done   Diabetic kidney evaluation - Urine ACR  02/04/2022   FOOT EXAM  02/09/2022   COVID-19 Vaccine (4 - 2024-25 season) 07/08/2023   HEMOGLOBIN A1C  02/21/2024   OPHTHALMOLOGY EXAM  02/29/2024   Medicare Annual Wellness (AWV)  06/03/2024   Health Maintenance Items Addressed: Discussed the need to update shingles vaccines and get flu and covid vaccines annually.  Patient needs a foot exam and A1c at upcoming office visit completed and documented.   Additional  Screening:  Vision Screening: Recommended annual ophthalmology exams for early detection of glaucoma and other disorders of the eye.  Overdue  [facility]  Patient's husband will call and schedule a diabetic eye exam Would you like a referral to an eye doctor? No    Dental Screening: Recommended annual dental exams for proper oral hygiene  Community Resource Referral / Chronic Care Management: CRR required this visit?  No   CCM required this visit?  No   Plan:    I have personally  reviewed and noted the following in the patient's chart:   Medical and social history Use of alcohol, tobacco or illicit drugs  Current medications and supplements including opioid prescriptions. Patient is not currently taking opioid prescriptions. Functional ability and status Nutritional status Physical activity Advanced directives List of other physicians Hospitalizations, surgeries, and ER visits in previous 12 months Vitals Screenings to include cognitive, depression, and falls Referrals and appointments  In addition, I have reviewed and discussed with patient certain preventive protocols, quality metrics, and best practice recommendations. A written personalized care plan for preventive services as well as general preventive health recommendations were provided to patient.   Angeline Fredericks, LPN   [id number]/7974   After Visit Summary: (MyChart) Due to this being a telephonic visit, the after visit summary with patients personalized plan was offered to patient via MyChart   Notes: Nothing significant to report at this time.

## 2024-06-06 ENCOUNTER — Telehealth: Payer: Self-pay | Admitting: Family Medicine

## 2024-06-06 NOTE — Telephone Encounter (Signed)
 Call placed to daughter Jacquelynne) regarding Maresha's shingles vaccine history. Provided information from [facility] record. All questions answered and verbalizes understanding.   Doyce CINDERELLA Shuck, RN

## 2024-06-06 NOTE — Telephone Encounter (Signed)
 The daughter called asking if her mother, Gabriela [name] received a shingles shot before.

## 2024-06-10 NOTE — Telephone Encounter (Signed)
 Will call pts daughter at 4 pm

## 2024-06-12 NOTE — Telephone Encounter (Signed)
Patient's daughter picked up paperwork. °

## 2024-08-06 DIAGNOSIS — G301 Alzheimer's disease with late onset: Secondary | ICD-10-CM | POA: Diagnosis not present

## 2024-08-08 ENCOUNTER — Encounter: Admitting: Nurse Practitioner

## 2024-08-19 DIAGNOSIS — Z23 Encounter for immunization: Secondary | ICD-10-CM | POA: Diagnosis not present

## 2024-09-03 ENCOUNTER — Encounter: Payer: Self-pay | Admitting: Nurse Practitioner

## 2024-09-08 NOTE — Telephone Encounter (Signed)
 Daughter has picked this form today

## 2024-11-28 ENCOUNTER — Ambulatory Visit: Admitting: Nurse Practitioner

## 2024-11-28 VITALS — BP 138/80 | HR 63 | Temp 97.8°F | Ht 66.0 in | Wt 181.6 lb

## 2024-11-28 DIAGNOSIS — E1169 Type 2 diabetes mellitus with other specified complication: Secondary | ICD-10-CM | POA: Diagnosis not present

## 2024-11-28 DIAGNOSIS — B351 Tinea unguium: Secondary | ICD-10-CM | POA: Diagnosis not present

## 2024-11-28 DIAGNOSIS — N3090 Cystitis, unspecified without hematuria: Secondary | ICD-10-CM | POA: Diagnosis not present

## 2024-11-28 DIAGNOSIS — E785 Hyperlipidemia, unspecified: Secondary | ICD-10-CM | POA: Diagnosis not present

## 2024-11-28 DIAGNOSIS — F0280 Dementia in other diseases classified elsewhere without behavioral disturbance: Secondary | ICD-10-CM | POA: Diagnosis not present

## 2024-11-28 DIAGNOSIS — Z7984 Long term (current) use of oral hypoglycemic drugs: Secondary | ICD-10-CM

## 2024-11-28 DIAGNOSIS — G301 Alzheimer's disease with late onset: Secondary | ICD-10-CM

## 2024-11-28 DIAGNOSIS — E119 Type 2 diabetes mellitus without complications: Secondary | ICD-10-CM

## 2024-11-28 MED ORDER — TERBINAFINE HCL 250 MG PO TABS: 250.0000 mg | ORAL_TABLET | Freq: Every day | ORAL | 1 refills | Status: AC

## 2024-11-28 NOTE — Progress Notes (Signed)
 " Leron Glance, NP-C Phone: [phone]  Gabriela [name] is a 79 y.o. female who presents today for follow up.   Discussed the use of AI scribe software for clinical note transcription with the patient, who gave verbal consent to proceed.  History of Present Illness   Gabriela [name] is a 79 year old female with Alzheimer's who presents for follow up. She is present with her husband who provides most of the history.   She has been experiencing a headache that began approximately one hour prior to the visit. There is no associated photophobia or nausea. She does not frequently experience headaches, and when one does occur, it typically resolves on it's own.  She has a history of diabetes and is currently taking metformin  twice a day. Her husband mentions that her blood sugar was previously checked and was 300, but it has not been checked since. She is also on pravastatin  for cholesterol and quetiapine at bedtime. All medications are crushed and administered with food due to previous issues with medication adherence.  Her husband reports a concern about toenail fungus, which he has attempted to treat with over-the-counter medications without success.  There was a recent issue with a bump under her armpit, which was found by a hospice aide during a shower. It was described as infected, and the aide expressed the contents and applied antibiotic cream. She was also recently treated for a UTI by the hospice physician and her family is requesting to recheck her urine to ensure it has resolved.      Tobacco Use History[1]  Medications Ordered Prior to Encounter[2]   ROS see history of present illness  Objective  Physical Exam Vitals:   11/28/24 1435  BP: 138/80  Pulse: 63  Temp: 97.8 F (36.6 C)  SpO2: 99%    BP Readings from Last 3 Encounters:  11/28/24 138/80  12/18/23 120/70  08/23/23 110/72   Wt Readings from Last 3 Encounters:  11/28/24 181 lb 9.6 oz (82.4 kg)  06/04/24 173  lb (78.5 kg)  12/18/23 173 lb 12.8 oz (78.8 kg)    Physical Exam Constitutional:      General: She is not in acute distress.    Appearance: Normal appearance.  HENT:     Head: Normocephalic.  Cardiovascular:     Rate and Rhythm: Normal rate and regular rhythm.     Heart sounds: Normal heart sounds.  Pulmonary:     Effort: Pulmonary effort is normal.     Breath sounds: Normal breath sounds.  Abdominal:     General: Abdomen is flat. Bowel sounds are normal.     Palpations: Abdomen is soft.     Tenderness: There is no abdominal tenderness.  Feet:     Right foot:     Skin integrity: Skin integrity normal.     Toenail Condition: Right toenails are abnormally thick. Fungal disease present.    Left foot:     Skin integrity: Skin integrity normal.     Toenail Condition: Left toenails are abnormally thick. Fungal disease present. Skin:    General: Skin is warm and dry.  Neurological:     Mental Status: She is alert. Mental status is at baseline.  Psychiatric:        Mood and Affect: Mood normal.        Behavior: Behavior normal.      Assessment/Plan: Please see individual problem list.  Alzheimer's disease with late onset [facility]) Assessment & Plan: Managed by [facility].  Progressive cognitive decline. Worsening nighttime agitation and increased behavioral disturbances. She was started on Seroquel 25 mg daily at bedtime in October. No longer taking Aricept. Follow up with Neurology as scheduled.    Onychomycosis Assessment & Plan: Toenail fungus is present. Treatment options were discussed, and family prefers prescription medication for convenience and efficacy. Prescribed Lamisil  250 mg daily for onychomycosis, ensuring it can be crushed for administration.   Orders: -     Terbinafine  HCl; Take 1 tablet (250 mg total) by mouth daily.  Dispense: 90 tablet; Refill: 1  Diabetes mellitus type II, non insulin dependent (HCC) Assessment & Plan: Her diabetes is managed  with metformin . Her last HbA1c was 6.7%, with a target under 7.5-8%. Continue metformin  twice daily. Check A1c.   Orders: -     Hemoglobin A1c  Hyperlipidemia associated with type 2 diabetes mellitus (HCC) Assessment & Plan: Managed with Pravastatin  20 mg daily. Continue.   Orders: -     Comprehensive metabolic panel with GFR  Cystitis -     Urine Culture; Future     Return in about 6 months (around 05/28/2025) for Follow up, sooner as needed.   Leron Glance, NP-C [facility] [place]     [1]  Social History Tobacco Use  Smoking Status Never  Smokeless Tobacco Never  [2]  Current Outpatient Medications on File Prior to Visit  Medication Sig Dispense Refill   cetirizine (ZYRTEC) 10 MG tablet Take 10 mg by mouth.     Lancets (ONETOUCH DELICA PLUS LANCET33G) MISC USE TO CHECK BLOOD SUGAR  DAILY 100 each 3   metFORMIN  (GLUCOPHAGE -XR) 500 MG 24 hr tablet TAKE 1 TABLET BY MOUTH TWICE DAILY 180 tablet 3   ONETOUCH ULTRA test strip USE ONCE DAILY 100 strip 3   pravastatin  (PRAVACHOL ) 20 MG tablet TAKE 1 TABLET BY MOUTH DAILY 90 tablet 3   QUEtiapine (SEROQUEL) 25 MG tablet Take 25 mg by mouth at bedtime.     No current facility-administered medications on file prior to visit.   "

## 2024-11-29 LAB — COMPREHENSIVE METABOLIC PANEL WITH GFR
ALT: 14 [IU]/L (ref 0–32)
AST: 20 [IU]/L (ref 0–40)
Albumin: 4.3 g/dL (ref 3.8–4.8)
Alkaline Phosphatase: 41 [IU]/L — ABNORMAL LOW (ref 49–135)
BUN/Creatinine Ratio: 24 (ref 12–28)
BUN: 22 mg/dL (ref 8–27)
Bilirubin Total: 0.4 mg/dL (ref 0.0–1.2)
CO2: 27 mmol/L (ref 20–29)
Calcium: 9.3 mg/dL (ref 8.7–10.3)
Chloride: 102 mmol/L (ref 96–106)
Creatinine, Ser: 0.91 mg/dL (ref 0.57–1.00)
Globulin, Total: 2 g/dL (ref 1.5–4.5)
Glucose: 139 mg/dL — ABNORMAL HIGH (ref 70–99)
Potassium: 5.1 mmol/L (ref 3.5–5.2)
Sodium: 140 mmol/L (ref 134–144)
Total Protein: 6.3 g/dL (ref 6.0–8.5)
eGFR: 65 mL/min/{1.73_m2}

## 2024-11-29 LAB — HEMOGLOBIN A1C
Est. average glucose Bld gHb Est-mCnc: 169 mg/dL
Hgb A1c MFr Bld: 7.5 % — ABNORMAL HIGH (ref 4.8–5.6)

## 2024-12-03 ENCOUNTER — Ambulatory Visit: Payer: Self-pay | Admitting: Nurse Practitioner

## 2024-12-04 ENCOUNTER — Other Ambulatory Visit: Payer: Self-pay | Admitting: *Deleted

## 2024-12-04 DIAGNOSIS — N3090 Cystitis, unspecified without hematuria: Secondary | ICD-10-CM

## 2024-12-05 ENCOUNTER — Other Ambulatory Visit: Payer: Self-pay | Admitting: *Deleted

## 2024-12-05 ENCOUNTER — Other Ambulatory Visit: Payer: Self-pay

## 2024-12-05 ENCOUNTER — Other Ambulatory Visit (HOSPITAL_COMMUNITY): Payer: Self-pay

## 2024-12-05 ENCOUNTER — Ambulatory Visit: Payer: Self-pay | Admitting: Nurse Practitioner

## 2024-12-05 ENCOUNTER — Other Ambulatory Visit

## 2024-12-05 DIAGNOSIS — N3090 Cystitis, unspecified without hematuria: Secondary | ICD-10-CM

## 2024-12-05 LAB — URINALYSIS, ROUTINE W REFLEX MICROSCOPIC
Bilirubin Urine: NEGATIVE
Hgb urine dipstick: NEGATIVE
Ketones, ur: NEGATIVE
Leukocytes,Ua: NEGATIVE
Nitrite: NEGATIVE
Specific Gravity, Urine: 1.01 (ref 1.000–1.030)
Total Protein, Urine: NEGATIVE
Urine Glucose: NEGATIVE
Urobilinogen, UA: 0.2 (ref 0.0–1.0)
pH: 6.5 (ref 5.0–8.0)

## 2024-12-05 LAB — URINE CULTURE
MICRO NUMBER:: 17526858
Result:: NO GROWTH
SPECIMEN QUALITY:: ADEQUATE

## 2024-12-08 ENCOUNTER — Telehealth: Payer: Self-pay

## 2024-12-08 ENCOUNTER — Other Ambulatory Visit (HOSPITAL_COMMUNITY): Payer: Self-pay

## 2024-12-08 NOTE — Telephone Encounter (Signed)
*  Primary  Pharmacy Patient Advocate Encounter   Received notification from Fax that prior authorization for Terbinafine  250mg   is required/requested.   Insurance verification completed.   The patient is insured through [facility].   Per test claim: Refill too soon. PA is not needed at this time. Medication was filled 11/28/2024. Next eligible fill date is 02/04/2025.

## 2024-12-09 ENCOUNTER — Encounter: Payer: Self-pay | Admitting: Nurse Practitioner

## 2024-12-09 DIAGNOSIS — B351 Tinea unguium: Secondary | ICD-10-CM | POA: Insufficient documentation

## 2024-12-09 NOTE — Assessment & Plan Note (Signed)
 Her diabetes is managed with metformin . Her last HbA1c was 6.7%, with a target under 7.5-8%. Continue metformin  twice daily. Check A1c.

## 2024-12-09 NOTE — Assessment & Plan Note (Signed)
 Toenail fungus is present. Treatment options were discussed, and family prefers prescription medication for convenience and efficacy. Prescribed Lamisil  250 mg daily for onychomycosis, ensuring it can be crushed for administration.

## 2024-12-09 NOTE — Assessment & Plan Note (Signed)
 Managed by [facility]. Progressive cognitive decline. Worsening nighttime agitation and increased behavioral disturbances. She was started on Seroquel 25 mg daily at bedtime in October. No longer taking Aricept. Follow up with Neurology as scheduled.

## 2024-12-10 ENCOUNTER — Other Ambulatory Visit (HOSPITAL_COMMUNITY): Payer: Self-pay

## 2025-06-08 ENCOUNTER — Ambulatory Visit
